# Patient Record
Sex: Female | Born: 1975 | Race: Black or African American | Hispanic: No | Marital: Single | Smoking: Never smoker
Health system: Southern US, Community
[De-identification: ages and names within clinical notes are randomized; demographics above are authoritative.]

## PROBLEM LIST (undated history)

## (undated) DIAGNOSIS — R51 Headache: Secondary | ICD-10-CM

## (undated) DIAGNOSIS — M797 Fibromyalgia: Secondary | ICD-10-CM

## (undated) DIAGNOSIS — Z8489 Family history of other specified conditions: Secondary | ICD-10-CM

## (undated) DIAGNOSIS — F32A Depression, unspecified: Secondary | ICD-10-CM

## (undated) DIAGNOSIS — G709 Myoneural disorder, unspecified: Secondary | ICD-10-CM

## (undated) DIAGNOSIS — R519 Headache, unspecified: Secondary | ICD-10-CM

## (undated) DIAGNOSIS — R87619 Unspecified abnormal cytological findings in specimens from cervix uteri: Secondary | ICD-10-CM

## (undated) DIAGNOSIS — I10 Essential (primary) hypertension: Secondary | ICD-10-CM

## (undated) DIAGNOSIS — IMO0002 Reserved for concepts with insufficient information to code with codable children: Secondary | ICD-10-CM

## (undated) DIAGNOSIS — D219 Benign neoplasm of connective and other soft tissue, unspecified: Secondary | ICD-10-CM

## (undated) DIAGNOSIS — F329 Major depressive disorder, single episode, unspecified: Secondary | ICD-10-CM

## (undated) DIAGNOSIS — G809 Cerebral palsy, unspecified: Secondary | ICD-10-CM

## (undated) HISTORY — DX: Unspecified abnormal cytological findings in specimens from cervix uteri: R87.619

## (undated) HISTORY — PX: MOUTH SURGERY: SHX715

## (undated) HISTORY — DX: Myoneural disorder, unspecified: G70.9

## (undated) HISTORY — DX: Benign neoplasm of connective and other soft tissue, unspecified: D21.9

## (undated) HISTORY — DX: Depression, unspecified: F32.A

## (undated) HISTORY — DX: Major depressive disorder, single episode, unspecified: F32.9

## (undated) HISTORY — DX: Reserved for concepts with insufficient information to code with codable children: IMO0002

---

## 1998-07-25 ENCOUNTER — Encounter: Admission: RE | Admit: 1998-07-25 | Discharge: 1998-07-25 | Payer: Self-pay | Admitting: Sports Medicine

## 1999-02-21 ENCOUNTER — Other Ambulatory Visit: Admission: RE | Admit: 1999-02-21 | Discharge: 1999-02-21 | Payer: Self-pay | Admitting: Family Medicine

## 1999-02-21 ENCOUNTER — Encounter: Admission: RE | Admit: 1999-02-21 | Discharge: 1999-02-21 | Payer: Self-pay | Admitting: Family Medicine

## 2000-02-27 ENCOUNTER — Encounter: Admission: RE | Admit: 2000-02-27 | Discharge: 2000-02-27 | Payer: Self-pay | Admitting: Family Medicine

## 2001-05-18 ENCOUNTER — Encounter: Admission: RE | Admit: 2001-05-18 | Discharge: 2001-05-18 | Payer: Self-pay | Admitting: Family Medicine

## 2002-03-15 ENCOUNTER — Encounter: Admission: RE | Admit: 2002-03-15 | Discharge: 2002-03-15 | Payer: Self-pay | Admitting: Family Medicine

## 2002-07-05 ENCOUNTER — Encounter: Admission: RE | Admit: 2002-07-05 | Discharge: 2002-07-05 | Payer: Self-pay | Admitting: Family Medicine

## 2002-11-22 ENCOUNTER — Encounter: Admission: RE | Admit: 2002-11-22 | Discharge: 2002-11-22 | Payer: Self-pay | Admitting: Family Medicine

## 2003-01-06 ENCOUNTER — Encounter: Admission: RE | Admit: 2003-01-06 | Discharge: 2003-01-06 | Payer: Self-pay | Admitting: Family Medicine

## 2003-05-18 ENCOUNTER — Encounter: Admission: RE | Admit: 2003-05-18 | Discharge: 2003-05-18 | Payer: Self-pay | Admitting: Family Medicine

## 2003-05-24 ENCOUNTER — Encounter: Admission: RE | Admit: 2003-05-24 | Discharge: 2003-05-24 | Payer: Self-pay | Admitting: Family Medicine

## 2003-06-15 ENCOUNTER — Encounter: Admission: RE | Admit: 2003-06-15 | Discharge: 2003-06-15 | Payer: Self-pay | Admitting: Family Medicine

## 2003-06-15 ENCOUNTER — Encounter: Payer: Self-pay | Admitting: Family Medicine

## 2003-07-13 ENCOUNTER — Encounter: Admission: RE | Admit: 2003-07-13 | Discharge: 2003-07-13 | Payer: Self-pay | Admitting: Family Medicine

## 2004-01-01 ENCOUNTER — Encounter: Admission: RE | Admit: 2004-01-01 | Discharge: 2004-01-01 | Payer: Self-pay | Admitting: Sports Medicine

## 2004-01-02 ENCOUNTER — Inpatient Hospital Stay (HOSPITAL_COMMUNITY): Admission: EM | Admit: 2004-01-02 | Discharge: 2004-01-03 | Payer: Self-pay | Admitting: Emergency Medicine

## 2004-01-16 ENCOUNTER — Encounter: Admission: RE | Admit: 2004-01-16 | Discharge: 2004-01-16 | Payer: Self-pay | Admitting: Family Medicine

## 2004-04-11 ENCOUNTER — Ambulatory Visit: Payer: Self-pay | Admitting: Family Medicine

## 2004-07-04 ENCOUNTER — Ambulatory Visit: Payer: Self-pay | Admitting: Family Medicine

## 2004-10-11 ENCOUNTER — Ambulatory Visit: Payer: Self-pay | Admitting: Family Medicine

## 2004-10-25 ENCOUNTER — Ambulatory Visit: Payer: Self-pay | Admitting: Family Medicine

## 2004-11-14 ENCOUNTER — Ambulatory Visit: Payer: Self-pay | Admitting: Family Medicine

## 2005-06-17 ENCOUNTER — Ambulatory Visit: Payer: Self-pay | Admitting: Family Medicine

## 2005-06-17 ENCOUNTER — Other Ambulatory Visit: Admission: RE | Admit: 2005-06-17 | Discharge: 2005-06-17 | Payer: Self-pay | Admitting: Family Medicine

## 2005-06-17 ENCOUNTER — Encounter: Payer: Self-pay | Admitting: Family Medicine

## 2005-07-02 ENCOUNTER — Encounter (INDEPENDENT_AMBULATORY_CARE_PROVIDER_SITE_OTHER): Payer: Self-pay | Admitting: *Deleted

## 2005-09-02 ENCOUNTER — Ambulatory Visit: Payer: Self-pay | Admitting: Family Medicine

## 2005-09-05 ENCOUNTER — Ambulatory Visit: Payer: Self-pay | Admitting: Family Medicine

## 2006-09-24 DIAGNOSIS — J45909 Unspecified asthma, uncomplicated: Secondary | ICD-10-CM

## 2006-09-24 DIAGNOSIS — G809 Cerebral palsy, unspecified: Secondary | ICD-10-CM | POA: Insufficient documentation

## 2006-09-24 DIAGNOSIS — K589 Irritable bowel syndrome without diarrhea: Secondary | ICD-10-CM | POA: Insufficient documentation

## 2006-09-25 ENCOUNTER — Encounter (INDEPENDENT_AMBULATORY_CARE_PROVIDER_SITE_OTHER): Payer: Self-pay | Admitting: *Deleted

## 2007-01-19 ENCOUNTER — Telehealth: Payer: Self-pay | Admitting: *Deleted

## 2007-01-21 ENCOUNTER — Encounter (INDEPENDENT_AMBULATORY_CARE_PROVIDER_SITE_OTHER): Payer: Self-pay | Admitting: Family Medicine

## 2007-01-21 ENCOUNTER — Ambulatory Visit: Payer: Self-pay | Admitting: Family Medicine

## 2007-01-21 ENCOUNTER — Encounter: Payer: Self-pay | Admitting: Family Medicine

## 2007-01-21 DIAGNOSIS — N949 Unspecified condition associated with female genital organs and menstrual cycle: Secondary | ICD-10-CM

## 2007-01-21 LAB — CONVERTED CEMR LAB
Beta hcg, urine, semiquantitative: NEGATIVE
Bilirubin Urine: NEGATIVE
Chlamydia, DNA Probe: NEGATIVE
GC Probe Amp, Genital: NEGATIVE
Glucose, Urine, Semiquant: NEGATIVE
Hemoglobin: 14.4 g/dL
Nitrite: NEGATIVE
Protein, U semiquant: NEGATIVE
RBC: 4.72 M/uL
Specific Gravity, Urine: 1.02
TSH: 2.365 microintl units/mL (ref 0.350–5.50)
Urobilinogen, UA: 0.2
WBC: 5.2 10*3/uL
Whiff Test: NEGATIVE

## 2007-02-15 ENCOUNTER — Encounter: Payer: Self-pay | Admitting: Family Medicine

## 2007-04-09 ENCOUNTER — Encounter (INDEPENDENT_AMBULATORY_CARE_PROVIDER_SITE_OTHER): Payer: Self-pay | Admitting: Family Medicine

## 2007-04-09 ENCOUNTER — Encounter (INDEPENDENT_AMBULATORY_CARE_PROVIDER_SITE_OTHER): Payer: Self-pay | Admitting: *Deleted

## 2007-04-09 ENCOUNTER — Ambulatory Visit: Payer: Self-pay | Admitting: Family Medicine

## 2007-04-09 ENCOUNTER — Telehealth (INDEPENDENT_AMBULATORY_CARE_PROVIDER_SITE_OTHER): Payer: Self-pay | Admitting: *Deleted

## 2007-04-19 ENCOUNTER — Telehealth: Payer: Self-pay | Admitting: Family Medicine

## 2007-06-04 ENCOUNTER — Encounter: Payer: Self-pay | Admitting: Family Medicine

## 2007-10-27 ENCOUNTER — Encounter: Payer: Self-pay | Admitting: Family Medicine

## 2007-12-09 ENCOUNTER — Encounter: Payer: Self-pay | Admitting: Family Medicine

## 2008-04-14 ENCOUNTER — Telehealth: Payer: Self-pay | Admitting: *Deleted

## 2008-04-14 ENCOUNTER — Ambulatory Visit: Payer: Self-pay | Admitting: Family Medicine

## 2008-04-14 LAB — CONVERTED CEMR LAB
Beta hcg, urine, semiquantitative: NEGATIVE
Ketones, urine, test strip: NEGATIVE
Nitrite: NEGATIVE
Urobilinogen, UA: 0.2
WBC Urine, dipstick: NEGATIVE

## 2008-04-20 ENCOUNTER — Ambulatory Visit: Payer: Self-pay | Admitting: Family Medicine

## 2008-05-05 ENCOUNTER — Telehealth: Payer: Self-pay | Admitting: Family Medicine

## 2008-05-10 ENCOUNTER — Telehealth: Payer: Self-pay | Admitting: *Deleted

## 2008-05-11 ENCOUNTER — Ambulatory Visit: Payer: Self-pay | Admitting: Family Medicine

## 2008-05-11 ENCOUNTER — Encounter: Payer: Self-pay | Admitting: Family Medicine

## 2008-05-11 ENCOUNTER — Other Ambulatory Visit: Admission: RE | Admit: 2008-05-11 | Discharge: 2008-05-11 | Payer: Self-pay | Admitting: Family Medicine

## 2008-05-15 ENCOUNTER — Telehealth: Payer: Self-pay | Admitting: Family Medicine

## 2008-07-10 ENCOUNTER — Telehealth: Payer: Self-pay | Admitting: *Deleted

## 2009-02-01 ENCOUNTER — Telehealth: Payer: Self-pay | Admitting: *Deleted

## 2009-05-21 ENCOUNTER — Telehealth: Payer: Self-pay | Admitting: *Deleted

## 2009-05-23 ENCOUNTER — Ambulatory Visit: Payer: Self-pay | Admitting: Family Medicine

## 2009-05-23 ENCOUNTER — Inpatient Hospital Stay (HOSPITAL_COMMUNITY): Admission: AD | Admit: 2009-05-23 | Discharge: 2009-05-24 | Payer: Self-pay | Admitting: Family Medicine

## 2009-05-23 ENCOUNTER — Telehealth: Payer: Self-pay | Admitting: Family Medicine

## 2009-06-18 ENCOUNTER — Ambulatory Visit: Payer: Self-pay | Admitting: Family Medicine

## 2009-06-18 DIAGNOSIS — I10 Essential (primary) hypertension: Secondary | ICD-10-CM

## 2009-06-18 LAB — CONVERTED CEMR LAB: Chlamydia, DNA Probe: NEGATIVE

## 2010-01-29 ENCOUNTER — Telehealth: Payer: Self-pay | Admitting: Family Medicine

## 2010-01-30 ENCOUNTER — Ambulatory Visit: Payer: Self-pay | Admitting: Family Medicine

## 2010-03-27 ENCOUNTER — Encounter: Payer: Self-pay | Admitting: Family Medicine

## 2010-04-25 ENCOUNTER — Telehealth: Payer: Self-pay | Admitting: *Deleted

## 2010-08-23 ENCOUNTER — Telehealth: Payer: Self-pay | Admitting: Family Medicine

## 2010-08-29 ENCOUNTER — Encounter: Payer: Self-pay | Admitting: *Deleted

## 2010-08-29 NOTE — Miscellaneous (Signed)
  Clinical Lists Changes  Problems: Changed problem from ASTHMA, UNSPECIFIED (ICD-493.90) to ASTHMA, PERSISTENT (ICD-493.90) 

## 2010-08-29 NOTE — Progress Notes (Signed)
Summary: Rx  Phone Note Refill Request Call back at Home Phone [phone]   Refills Requested: Medication #1:  FLOVENT HFA 110 MCG/ACT AERO 1 puff two times a day - 1 mdi. [facility]   Initial call taken by: Knox Royalty,  August 23, 2010 2:06 PM  Follow-up for Phone Call        will forward to MD. Follow-up by: Theresia Lo RN,  August 23, 2010 2:35 PM    Prescriptions: FLOVENT HFA 110 MCG/ACT AERO (FLUTICASONE PROPIONATE  HFA) 1 puff two times a day - 1 mdi  #1 x 6   Entered and Authorized by:   Pearlean Brownie MD   Signed by:   Pearlean Brownie MD on 08/23/2010   Method used:   Electronically to        [id number] (retail)       [address]       [place], [place]  [address]       Ph: [phone] or [phone]       Fax: [phone]   RxID:   [id number]

## 2010-08-29 NOTE — Progress Notes (Signed)
Summary: Rx Req  Phone Note Refill Request Call back at Home Phone [phone] Message from:  Patient  Refills Requested: Medication #1:  PROAIR HFA 108 (90 BASE) MCG/ACT  AERS 2 puffs as needed up to 4x a day [facility] [address]  Initial call taken by: Clydell Hakim,  January 29, 2010 1:41 PM    Prescriptions: PROAIR HFA 108 (90 BASE) MCG/ACT  AERS (ALBUTEROL SULFATE) 2 puffs as needed up to 4x a day  #1 x 1   Entered and Authorized by:   Pearlean Brownie MD   Signed by:   Pearlean Brownie MD on 01/29/2010   Method used:   Electronically to        [id number] (retail)       [address]       [place], [place]  [address]       Ph: [phone] or [phone]       Fax: [phone]   RxID:   [id number]

## 2010-08-29 NOTE — Assessment & Plan Note (Signed)
Summary: asthma & congestion/[place]   Vital Signs:  Patient profile:   35 year old female Height:      57.5 inches Weight:      170.8 pounds BMI:     36.45 O2 Sat:      100 % on Room air Temp:     99.5 degrees F oral Pulse rate:   87 / minute BP sitting:   140 / 99  (left arm) Cuff size:   regular  Vitals Entered By: Garen Grams LPN (January 31, 5408 9:26 AM)  O2 Flow:  Room air  Serial Vital Signs/Assessments:  Comments: 9:27 AM Peak Flow Rates: 280 280 270 By: Garen Grams LPN   CC: cough/congestion/SOB x 3 days Is Patient Diabetic? No Pain Assessment Patient in pain? no        Primary Care Provider:  Pearlean Brownie MD  CC:  cough/congestion/SOB x 3 days.  History of Present Illness: URI and wheezing Has had stuffy nose with increased wheezing for last few days. Had sick contact at work.  Ran out of proair and has not used for a week.  Has not use flovent for a "long time".  Feels tired but does not have severe shortness of breath and has been going to work.  No fever or sputum or chest pain.  BP has taken occaisionally has usually been "good" but does not recall readings  ROS - as above PMH - Medications reviewed and updated in medication list.  Smoking Status noted in VS form     Habits & Providers  Alcohol-Tobacco-Diet     Tobacco Status: never  Allergies: 1)  ! * Cats, Dust, Pollen  Physical Exam  General:  no acute distress or audible wheezing Nose:  congested bilaterally   Lungs:  mild expiratory wheeze.  No dullness good airmovement Heart:  Normal rate and regular rhythm. S1 and S2 normal without gallop, murmur, click, rub or other extra sounds. Extremities:  no edema   Impression & Recommendations:  Problem # 1:  ASTHMA, UNSPECIFIED (ICD-493.90)  mild exacerbation.  Will restart proair and use short increased dose of inhaled steroid.  Do not think needs by mouth steroids now.  No signs of bacgterial infection  Her updated medication  list for this problem includes:    Proair Hfa 108 (90 Base) Mcg/act Aers (Albuterol sulfate) .Marland Kitchen... 2 puffs as needed up to 4x a day    Flovent Hfa 110 Mcg/act Aero (Fluticasone propionate  hfa) .Marland Kitchen... 1 puff two times a day - 1 mdi  Orders: [facility]- Est Level  3 (99213)  Problem # 2:  ELEVATED BLOOD PRESSURE WITHOUT DIAGNOSIS OF HYPERTENSION (ICD-796.2) keep a diary and will recheck   Complete Medication List: 1)  Sprintec 28 0.25-35 Mg-mcg Tabs (Norgestimate-eth estradiol) .... As directed 2)  Ferrous Sulfate 325 (65 Fe) Mg Tbec (Ferrous sulfate) .... Two times a day 3)  Proair Hfa 108 (90 Base) Mcg/act Aers (Albuterol sulfate) .... 2 puffs as needed up to 4x a day 4)  Flovent Hfa 110 Mcg/act Aero (Fluticasone propionate  hfa) .Marland Kitchen.. 1 puff two times a day - 1 mdi  Patient Instructions: 1)  Please schedule a follow-up appointment in 1 month.  2)  Check your blood pressure twice a week and write down and bring in. 3)  Use the flovent 4 puffs two times a day for the next 7 days then go back to 2 puffs two times a day 4)  Use the proair 4 x  a day or as needed if having to use more than every 2-4 hours call us Prescriptions: FLOVENT HFA 110 MCG/ACT AERO (FLUTICASONE PROPIONATE  HFA) 1 puff two times a day - 1 mdi  #1 x 6   Entered and Authorized by:   Pearlean Brownie MD   Signed by:   Pearlean Brownie MD on 01/30/2010   Method used:   Electronically to        [id number]       [address]       [place], [place]  [address]       Ph: [phone] or [phone]       Fax: [phone]   RxID:   [id number]

## 2010-08-29 NOTE — Progress Notes (Signed)
Summary: triage  Phone Note Call from Patient Call back at Home Phone [phone]   Caller: Patient Summary of Call: achey/congestion/sinus prob/no fever  Initial call taken by: De Nurse,  April 25, 2010 1:36 PM  Follow-up for Phone Call        Described cold signs.  Told her the best treatment was OTCs and fluids.  Had been taking Nyquil which she says was making her dizzy.  Advised her to switch to Dayquil.  If she is not feeling any better in am to call us and we would see her.  Pt agreeable. Follow-up by: Dennison Nancy RN,  April 25, 2010 2:06 PM

## 2010-08-29 NOTE — Progress Notes (Signed)
Summary: triage  Phone Note Call from Patient Call back at Home Phone [phone]   Caller: Patient Summary of Call: Pt wondering if she can be seen tomorrow morning for her asthma. Initial call taken by: Clydell Hakim,  January 29, 2010 2:56 PM  Follow-up for Phone Call        states she is congested & her asthma is acting up. states she wants to avoid hospitalization. severity is a 3/10 per pt. told her if it got worse, we have md on call. states she has her meds. sounded well on phone Follow-up by: Golden Circle RN,  January 29, 2010 2:56 PM

## 2010-11-05 LAB — RUBELLA ANTIBODY, IGM: Rubella: IMMUNE

## 2010-11-05 LAB — HEPATITIS B SURFACE ANTIGEN: Hepatitis B Surface Ag: NEGATIVE

## 2010-11-05 LAB — ABO/RH: RH Type: POSITIVE

## 2010-11-11 ENCOUNTER — Inpatient Hospital Stay (HOSPITAL_COMMUNITY)
Admission: AD | Admit: 2010-11-11 | Discharge: 2010-11-11 | Disposition: A | Discharge status: Home / Self Care | Payer: Medicare Other | Source: Ambulatory Visit | Attending: Obstetrics and Gynecology | Admitting: Obstetrics and Gynecology

## 2010-11-11 ENCOUNTER — Inpatient Hospital Stay (HOSPITAL_COMMUNITY): Payer: Medicare Other

## 2010-11-11 DIAGNOSIS — O3410 Maternal care for benign tumor of corpus uteri, unspecified trimester: Secondary | ICD-10-CM

## 2010-11-11 DIAGNOSIS — D259 Leiomyoma of uterus, unspecified: Secondary | ICD-10-CM | POA: Insufficient documentation

## 2010-11-11 DIAGNOSIS — R109 Unspecified abdominal pain: Secondary | ICD-10-CM | POA: Insufficient documentation

## 2010-11-11 LAB — URINALYSIS, ROUTINE W REFLEX MICROSCOPIC
Glucose, UA: NEGATIVE mg/dL
Protein, ur: NEGATIVE mg/dL
pH: 6.5 (ref 5.0–8.0)

## 2010-11-11 LAB — URINE MICROSCOPIC-ADD ON

## 2010-11-13 ENCOUNTER — Inpatient Hospital Stay (HOSPITAL_COMMUNITY)
Admission: AD | Admit: 2010-11-13 | Discharge: 2010-11-14 | Disposition: A | Discharge status: Home / Self Care | Payer: Medicare Other | Source: Ambulatory Visit | Attending: Obstetrics and Gynecology | Admitting: Obstetrics and Gynecology

## 2010-11-13 DIAGNOSIS — R109 Unspecified abdominal pain: Secondary | ICD-10-CM

## 2010-11-13 DIAGNOSIS — O9989 Other specified diseases and conditions complicating pregnancy, childbirth and the puerperium: Secondary | ICD-10-CM

## 2010-11-13 DIAGNOSIS — O99891 Other specified diseases and conditions complicating pregnancy: Secondary | ICD-10-CM | POA: Insufficient documentation

## 2010-11-13 LAB — URINE CULTURE: Culture  Setup Time: 201204170203

## 2010-11-14 LAB — URINALYSIS, ROUTINE W REFLEX MICROSCOPIC
Glucose, UA: NEGATIVE mg/dL
Nitrite: NEGATIVE
pH: 6 (ref 5.0–8.0)

## 2010-11-14 LAB — CBC
HCT: 37.4 % (ref 36.0–46.0)
Hemoglobin: 12.4 g/dL (ref 12.0–15.0)
MCH: 29.5 pg (ref 26.0–34.0)
MCHC: 33.2 g/dL (ref 30.0–36.0)
RBC: 4.21 MIL/uL (ref 3.87–5.11)
WBC: 11.3 10*3/uL — ABNORMAL HIGH (ref 4.0–10.5)

## 2010-11-14 LAB — URINE MICROSCOPIC-ADD ON

## 2010-12-13 NOTE — Discharge Summary (Signed)
NAME:  [name] [name], [name] [name]                          ACCOUNT NO.:  [id number]   MEDICAL RECORD NO.:  [id number]                   PATIENT TYPE:  INP   LOCATION:  5735                                 FACILITY:  [facility]   PHYSICIAN:  Santiago Bumpers. Hensel, M.D.             DATE OF BIRTH:  03-07-1976   DATE OF ADMISSION:  01/01/2004  DATE OF DISCHARGE:  01/03/2004                                 DISCHARGE SUMMARY   DISCHARGE DIAGNOSES:  1. Acute asthma exacerbation.  2. Irritable bowel syndrome.  3. Asthma, moderate persistent.   DISCHARGE MEDICATIONS:  1. Qvar 80 mcg per spray inhaled b.i.d.  2. Albuterol M.D.I. with spacer, 2-4 sprays q.4h. p.r.n. wheezing and     shortness of breath.  3. Multivitamin once a day.  4. Os-Cal vitamin D as taken before.  5. Prednisone 100 mg one a day x7 days.  6. Colace or other stool softener as taken before.  7. Entex LA one capsule p.r.n. q.12h. cough.  8. May take Robitussin DM over the counter.  9. Ranitidine as taken before.  10.      Ibuprofen p.r.n.  11.      Tylenol p.r.n.   ACTIVITY:  Without restrictions.   DIET:  Increase fiber.   FOLLOW UP:  Follow up with Dr. Jolinda Croak at [facility] on  January 16, 2004 at 11 a.m. as already scheduled.   BRIEF HISTORY AND HOSPITAL COURSE:  Ms. Bonnie is a 35 year old African-  American female with moderate persistent asthma with increasing symptoms x3  days secondary to upper respiratory infection.  She presented to the ED with  increasing shortness of breath, and on exam had increased work of breathing,  and was admitted.  1. Acute asthma exacerbation.  The patient was noncompliant with maintenance     inhaled steroid for several months.  Now likely persistent symptoms with     viral URI.  Chest x-ray was within normal limits.  Responded to nebs in     the ED, although still with increased work of breathing.  Improved with     nebulizer treatments and corticosteroids, and was sent home on  the above     regimen on hospital day #2 with normal O2 saturations.  2. Irritable bowel syndrome.  Continued on outpatient regimen while in the     hospital, including fiber, Colace, Dulcolax, multivitamin, calcium, and     vitamin D.  3. Obesity.  The patient working on increasing her exercise, which has been     difficult with her asthma.  Encouraged maintenance Qvar, as well as     albuterol 15-30 minutes prior to exercise as needed.  4. The patient was administered the pneumococcal vaccine per protocol on     January 02, 2004.      Ace Gins, MD  William A. Leveda Anna, M.D.    JS/MEDQ  D:  02/19/2004  T:  02/20/2004  Job:  [id number]   cc:   Dr. Annie Main(?)  [facility]

## 2010-12-20 ENCOUNTER — Ambulatory Visit (INDEPENDENT_AMBULATORY_CARE_PROVIDER_SITE_OTHER): Payer: Medicaid Other | Admitting: Family Medicine

## 2010-12-20 ENCOUNTER — Encounter: Payer: Self-pay | Admitting: Family Medicine

## 2010-12-20 ENCOUNTER — Telehealth: Payer: Self-pay | Admitting: Family Medicine

## 2010-12-20 VITALS — BP 122/88 | HR 108 | Temp 98.3°F | Ht <= 58 in | Wt 169.6 lb

## 2010-12-20 DIAGNOSIS — H6690 Otitis media, unspecified, unspecified ear: Secondary | ICD-10-CM

## 2010-12-20 DIAGNOSIS — J45909 Unspecified asthma, uncomplicated: Secondary | ICD-10-CM

## 2010-12-20 MED ORDER — ALBUTEROL SULFATE HFA 108 (90 BASE) MCG/ACT IN AERS: 2.0000 | INHALATION_SPRAY | RESPIRATORY_TRACT | Status: DC | PRN

## 2010-12-20 MED ORDER — FLUTICASONE PROPIONATE HFA 110 MCG/ACT IN AERO: 2.0000 | INHALATION_SPRAY | Freq: Two times a day (BID) | RESPIRATORY_TRACT | Status: DC

## 2010-12-20 MED ORDER — AMOXICILLIN 875 MG PO TABS: 875.0000 mg | ORAL_TABLET | Freq: Two times a day (BID) | ORAL | Status: AC

## 2010-12-20 MED ORDER — PRENATAL RX 60-1 MG PO TABS: 1.0000 | ORAL_TABLET | Freq: Every day | ORAL | Status: DC

## 2010-12-20 NOTE — Telephone Encounter (Signed)
Will send in antibiotics for her ears. They are ok to take while pregnant. Please let her know. Thanks.

## 2010-12-20 NOTE — Telephone Encounter (Signed)
Reviewed OV note and Dr. Clotilde Dieter did instruct her to call back if ears still bothering her.  Will route this note to her.

## 2010-12-20 NOTE — Telephone Encounter (Signed)
Pt says she was told to call if her ears were still bothering her, she says they are & was told MD would call in abx if this was still an issue.

## 2010-12-20 NOTE — Patient Instructions (Signed)
After discussing the options with Dr. Deirdre Priest, it is felt that you should restart the Flovent and Albuterol. Do the saline nasal spray to help with congestion.  If you ear begins to cause you pain or your hearing changes let us know and i will call in antibiotics.

## 2010-12-20 NOTE — Telephone Encounter (Signed)
Pt called back and informed of above.

## 2010-12-26 NOTE — Progress Notes (Signed)
Asthma vs URI: Pt is [redacted] weeks pregnant and comes in today because she has started feeling like she is getting a cold. She had a sore throat over teh weekend with some congestion. She tried taking Mucinex but it did not help. She has been wheezing at night but has not had fevers or chills. She is continuing to work at [employer]  and says that some of the patients have been sick so she has had exposure. She is currently out of albuterol so she has not been using anything for her asthma. Pt also developed some ear pain.   ROs: neg except as noted in HPI.   PE:  Gen: NAD, seated comfrotably. VS show tachycardia and O2 of 98% on room air.  Ears;Rt ear has a bulging TM but no erythema in the canal or on the TM.  Left ear appears normal.  CV: RRR, no murmur, no tachycardia by the time I listened to the patient she had been seated for a while.  Pulm: diffuse wheezing throughout entire lung fields.

## 2010-12-26 NOTE — Assessment & Plan Note (Signed)
Discussed with Dr. Deirdre Priest.  Decided to put the patient on an antibiotic because of ear pain, refilled her asthma meds.

## 2011-01-28 ENCOUNTER — Inpatient Hospital Stay (HOSPITAL_COMMUNITY)
Admission: AD | Admit: 2011-01-28 | Discharge: 2011-01-28 | Disposition: A | Discharge status: Home / Self Care | Payer: Medicare Other | Source: Ambulatory Visit | Attending: Obstetrics and Gynecology | Admitting: Obstetrics and Gynecology

## 2011-01-28 DIAGNOSIS — O9989 Other specified diseases and conditions complicating pregnancy, childbirth and the puerperium: Secondary | ICD-10-CM

## 2011-01-28 DIAGNOSIS — O99891 Other specified diseases and conditions complicating pregnancy: Secondary | ICD-10-CM | POA: Insufficient documentation

## 2011-03-17 ENCOUNTER — Telehealth (HOSPITAL_COMMUNITY): Payer: Self-pay | Admitting: *Deleted

## 2011-03-17 ENCOUNTER — Encounter (HOSPITAL_COMMUNITY): Payer: Self-pay | Admitting: *Deleted

## 2011-03-17 NOTE — Telephone Encounter (Signed)
Preadmission screen  

## 2011-03-17 NOTE — Telephone Encounter (Signed)
pread screen 

## 2011-03-18 ENCOUNTER — Telehealth (HOSPITAL_COMMUNITY): Payer: Self-pay | Admitting: *Deleted

## 2011-03-19 ENCOUNTER — Inpatient Hospital Stay (HOSPITAL_COMMUNITY): Admission: RE | Admit: 2011-03-19 | Payer: Medicaid Other | Source: Ambulatory Visit

## 2011-03-19 ENCOUNTER — Encounter (HOSPITAL_COMMUNITY): Payer: Self-pay

## 2011-03-19 ENCOUNTER — Inpatient Hospital Stay (HOSPITAL_COMMUNITY)
Admission: AD | Admit: 2011-03-19 | Discharge: 2011-03-23 | DRG: 765 | Disposition: A | Discharge status: Home / Self Care | Payer: Medicare Other | Source: Ambulatory Visit | Attending: Obstetrics and Gynecology | Admitting: Obstetrics and Gynecology

## 2011-03-19 DIAGNOSIS — O99892 Other specified diseases and conditions complicating childbirth: Secondary | ICD-10-CM | POA: Diagnosis present

## 2011-03-19 DIAGNOSIS — O4290 Premature rupture of membranes, unspecified as to length of time between rupture and onset of labor, unspecified weeks of gestation: Secondary | ICD-10-CM | POA: Diagnosis present

## 2011-03-19 DIAGNOSIS — Z98891 History of uterine scar from previous surgery: Secondary | ICD-10-CM

## 2011-03-19 DIAGNOSIS — G809 Cerebral palsy, unspecified: Secondary | ICD-10-CM | POA: Diagnosis present

## 2011-03-19 DIAGNOSIS — J45909 Unspecified asthma, uncomplicated: Secondary | ICD-10-CM

## 2011-03-19 DIAGNOSIS — D259 Leiomyoma of uterus, unspecified: Secondary | ICD-10-CM | POA: Diagnosis present

## 2011-03-19 DIAGNOSIS — O34599 Maternal care for other abnormalities of gravid uterus, unspecified trimester: Secondary | ICD-10-CM | POA: Diagnosis present

## 2011-03-19 DIAGNOSIS — K560 Paralytic ileus: Secondary | ICD-10-CM | POA: Diagnosis not present

## 2011-03-19 DIAGNOSIS — D4959 Neoplasm of unspecified behavior of other genitourinary organ: Secondary | ICD-10-CM | POA: Diagnosis present

## 2011-03-19 LAB — CBC
HCT: 34.7 % — ABNORMAL LOW (ref 36.0–46.0)
RDW: 14.7 % (ref 11.5–15.5)
WBC: 10.4 10*3/uL (ref 4.0–10.5)

## 2011-03-19 MED ORDER — ONDANSETRON HCL 4 MG/2ML IJ SOLN
4.0000 mg | Freq: Four times a day (QID) | INTRAMUSCULAR | Status: DC | PRN
  Administered 2011-03-20: 4 mg via INTRAVENOUS
  Filled 2011-03-19: qty 2

## 2011-03-19 MED ORDER — FENTANYL 2.5 MCG/ML BUPIVACAINE 1/10 % EPIDURAL INFUSION (WH - ANES)
14.0000 mL/h | INTRAMUSCULAR | Status: DC
  Administered 2011-03-19 – 2011-03-20 (×5): 14 mL/h via EPIDURAL
  Filled 2011-03-19 (×6): qty 60

## 2011-03-19 MED ORDER — EPHEDRINE 5 MG/ML INJ
10.0000 mg | INTRAVENOUS | Status: DC | PRN
  Filled 2011-03-19: qty 4

## 2011-03-19 MED ORDER — PHENYLEPHRINE 40 MCG/ML (10ML) SYRINGE FOR IV PUSH (FOR BLOOD PRESSURE SUPPORT)
80.0000 ug | PREFILLED_SYRINGE | INTRAVENOUS | Status: DC | PRN
  Filled 2011-03-19: qty 5

## 2011-03-19 MED ORDER — ACETAMINOPHEN 325 MG PO TABS: 650.0000 mg | ORAL_TABLET | ORAL | Status: DC | PRN

## 2011-03-19 MED ORDER — BUTORPHANOL TARTRATE 2 MG/ML IJ SOLN
INTRAMUSCULAR | Status: AC
  Administered 2011-03-19: 1 mg via INTRAVENOUS
  Filled 2011-03-19: qty 1

## 2011-03-19 MED ORDER — IBUPROFEN 600 MG PO TABS: 600.0000 mg | ORAL_TABLET | Freq: Four times a day (QID) | ORAL | Status: DC | PRN

## 2011-03-19 MED ORDER — FLEET ENEMA 7-19 GM/118ML RE ENEM: 1.0000 | ENEMA | RECTAL | Status: DC | PRN

## 2011-03-19 MED ORDER — OXYTOCIN 20 UNITS IN LACTATED RINGERS INFUSION - SIMPLE: 125.0000 mL/h | INTRAVENOUS | Status: AC

## 2011-03-19 MED ORDER — LACTATED RINGERS IV SOLN: 500.0000 mL | INTRAVENOUS | Status: DC | PRN

## 2011-03-19 MED ORDER — LIDOCAINE HCL (PF) 1 % IJ SOLN
30.0000 mL | INTRAMUSCULAR | Status: DC | PRN
  Filled 2011-03-19: qty 30

## 2011-03-19 MED ORDER — EPHEDRINE 5 MG/ML INJ
10.0000 mg | INTRAVENOUS | Status: DC | PRN
  Filled 2011-03-19 (×2): qty 4

## 2011-03-19 MED ORDER — BUTORPHANOL TARTRATE 2 MG/ML IJ SOLN
1.0000 mg | Freq: Once | INTRAMUSCULAR | Status: AC
  Administered 2011-03-19: 1 mg via INTRAVENOUS

## 2011-03-19 MED ORDER — PENICILLIN G POTASSIUM 5000000 UNITS IJ SOLR
2.5000 10*6.[IU] | INTRAVENOUS | Status: DC
  Filled 2011-03-19 (×3): qty 2.5

## 2011-03-19 MED ORDER — OXYCODONE-ACETAMINOPHEN 5-325 MG PO TABS: 2.0000 | ORAL_TABLET | ORAL | Status: DC | PRN

## 2011-03-19 MED ORDER — OXYTOCIN 20 UNITS IN LACTATED RINGERS INFUSION - SIMPLE
1.0000 m[IU]/min | INTRAVENOUS | Status: DC
  Administered 2011-03-19: 1 m[IU]/min via INTRAVENOUS
  Administered 2011-03-20 (×2): 14 m[IU]/min via INTRAVENOUS
  Filled 2011-03-19: qty 1000

## 2011-03-19 MED ORDER — LACTATED RINGERS IV SOLN
INTRAVENOUS | Status: DC
  Administered 2011-03-19: 125 mL/h via INTRAVENOUS
  Administered 2011-03-19 – 2011-03-20 (×9): via INTRAVENOUS

## 2011-03-19 MED ORDER — LACTATED RINGERS IV SOLN
500.0000 mL | Freq: Once | INTRAVENOUS | Status: AC
  Administered 2011-03-19: 500 mL via INTRAVENOUS

## 2011-03-19 MED ORDER — PENICILLIN G POTASSIUM 5000000 UNITS IJ SOLR
5.0000 10*6.[IU] | Freq: Once | INTRAVENOUS | Status: DC
  Filled 2011-03-19: qty 5

## 2011-03-19 MED ORDER — DIPHENHYDRAMINE HCL 50 MG/ML IJ SOLN: 12.5000 mg | INTRAMUSCULAR | Status: DC | PRN

## 2011-03-19 MED ORDER — OXYTOCIN BOLUS FROM INFUSION
500.0000 mL | Freq: Once | INTRAVENOUS | Status: DC
  Filled 2011-03-19: qty 500

## 2011-03-19 MED ORDER — CLINDAMYCIN PHOSPHATE 900 MG/50ML IV SOLN: 900.0000 mg | Freq: Three times a day (TID) | INTRAVENOUS | Status: DC

## 2011-03-19 MED ORDER — TERBUTALINE SULFATE 1 MG/ML IJ SOLN: 0.2500 mg | Freq: Once | INTRAMUSCULAR | Status: AC | PRN

## 2011-03-19 MED ORDER — LACTATED RINGERS IV SOLN: INTRAVENOUS | Status: DC

## 2011-03-19 MED ORDER — PHENYLEPHRINE 40 MCG/ML (10ML) SYRINGE FOR IV PUSH (FOR BLOOD PRESSURE SUPPORT)
80.0000 ug | PREFILLED_SYRINGE | INTRAVENOUS | Status: DC | PRN
  Filled 2011-03-19 (×2): qty 5

## 2011-03-19 MED ORDER — FENTANYL 2.5 MCG/ML BUPIVACAINE 1/10 % EPIDURAL INFUSION (WH - ANES)
INTRAMUSCULAR | Status: DC | PRN
  Administered 2011-03-19: 14 mL/h via EPIDURAL

## 2011-03-19 MED ORDER — LIDOCAINE HCL 1.5 % IJ SOLN
INTRAMUSCULAR | Status: DC | PRN
  Administered 2011-03-19 (×2): 5 mL via EPIDURAL

## 2011-03-19 MED ORDER — CITRIC ACID-SODIUM CITRATE 334-500 MG/5ML PO SOLN
30.0000 mL | ORAL | Status: DC | PRN
  Administered 2011-03-20: 30 mL via ORAL
  Filled 2011-03-19: qty 15

## 2011-03-19 MED ORDER — OXYTOCIN 20 UNITS IN LACTATED RINGERS INFUSION - SIMPLE: 125.0000 mL/h | INTRAVENOUS | Status: DC

## 2011-03-19 MED ORDER — LIDOCAINE HCL (PF) 1 % IJ SOLN: 30.0000 mL | INTRAMUSCULAR | Status: DC | PRN

## 2011-03-19 MED ORDER — OXYTOCIN BOLUS FROM INFUSION
500.0000 mL | Freq: Once | INTRAVENOUS | Status: DC
  Filled 2011-03-19: qty 1000
  Filled 2011-03-19: qty 500

## 2011-03-19 NOTE — Progress Notes (Signed)
  Pitocin at 10 mu/minute. Contractions q 2-4 minutes. Cervix FT dilated 50 % effaced and vertex at -3 station.FHT's normal

## 2011-03-19 NOTE — Progress Notes (Signed)
Dr. Ambrose Mantle notified of pt status, SVE, FHR, UC pattern, pitocin amount, and pt's pain request for medication.  Orders received, will continue to monitor.

## 2011-03-19 NOTE — H&P (Signed)
NAME:  [name] [name], [name] [name]              ACCOUNT NO.:  [id number]  MEDICAL RECORD NO.:  [id number]  LOCATION:  9171                          FACILITY:  [facility]  PHYSICIAN:  Malachi Pro. Ambrose Mantle, M.D. DATE OF BIRTH:  May 20, 1976  DATE OF ADMISSION:  03/19/2011 DATE OF DISCHARGE:                             HISTORY & PHYSICAL   PRESENT ILLNESS:  This is a 35 year old black female para 0, gravida 1, EDC March 17, 2011, by an ultrasound at 15 weeks 3 days done on September 26, 2010.  The patient awoke this morning at 5:00 a.m. with ruptured membranes.  She did not arrive at the hospital until 12 noon and she is admitted and will start Pitocin.  She is O+ with a negative antibody. Pap smear is normal.  Rubella immune, RPR nonreactive.  Urine culture negative, hepatitis B surface antigen negative, HIV negative.  HSV culture negative, hemoglobin electrophoresis AA, GC and Chlamydia negative.  Quad screen was negative.  One-hour Glucola 99.  Group B strep negative.  This patient was seen initially at about 22 weeks' gestation.  Her pregnancy was complicated by significant pain probably secondary to a degenerating 11-cm fibroid in the left side of her uterus.  She had no other major problems during the pregnancy.  She awoke this morning at 5:00 a.m. with ruptured membranes with clear fluid.  She presented to the hospital at 12 noon.  PAST MEDICAL HISTORY:  No known allergies.  ILLNESSES:  She had an abnormal Pap smear in 2008, but the repeat was normal.  She has had a history of moderate asthma, uses inhaler about once a night, she was never intubated.  She has been hospitalized overnight about 10 times since age 89 for asthma.  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  She has had depression, never on medications.  She has had therapies in the past.  She has had a history of irritable bowel syndrome.  She did have Trichomonas  vaginitis.  OPERATIONS:  Oral surgery in 1995.  SOCIAL HISTORY:  She denies alcohol, tobacco and illicit substance abuse.  She does have a 4-year degree and business administration.  She is single.  FAMILY HISTORY:  Mother has diabetes, lung cancer and lupus, as well as Ricketts.  A brother has schizophrenia.  Maternal grandfather with a malignancy of the larynx.  Maternal grandmother with colon cancer.  There has been no obstetric history.  PHYSICAL EXAMINATION:  VITAL SIGNS:  On admission, blood pressure is 122/82, pulse is 101, temperature 98.2, respirations 16.  The patient is less than 5 feet tall. LUNGS:  Clear to auscultation. HEART:  Normal size and sounds.  No murmurs.  There is no wheezing in the lungs. ABDOMEN:  Soft.  She actually was examined in our office on August 21.  Fundal height was 41 cm.  Fetal heart tones are normal.  The membranes are ruptured with clear fluid.  The cervix is a fingertip dilated.  It is extremely long.  Presenting part is vertex at -4 station.  ADMITTING IMPRESSION:  Intrauterine pregnancy at 40+ weeks gestation, premature rupture of  membranes, cerebral palsy, large leiomyomata uteri. The patient is admitted to began Pitocin.  She is counseled that she has a number of factors which do not work in her favor as far as a vaginal delivery. 1. Premature rupture of membranes that has waited 6 hours for any     treatment. 2. Very short stature 3. Unfavorable cervix with a high presenting part. 4. Leiomyomata uteri. 5. Cerebral palsy.  She is started on Pitocin and will watch for     effacement and dilatation of the cervix.     Malachi Pro. Ambrose Mantle, M.D.     TFH/MEDQ  D:  03/19/2011  T:  03/19/2011  Job:  [id number]

## 2011-03-19 NOTE — Progress Notes (Signed)
Dr. Ambrose Mantle notified of pt status, FHR, UC pattern, pitocin amount, and pt's pain. Will continue to monitor.

## 2011-03-19 NOTE — Progress Notes (Signed)
Pt states she started leaking clear fluid at 0500. Continues to leak a large amount of clear fluid, clothing and pad saturated and leaking. Reports good fetal movement. Scheduled for IOL tonight. Reports no contractions.

## 2011-03-19 NOTE — Anesthesia Preprocedure Evaluation (Signed)
Anesthesia Evaluation  Name, MR# and DOB Patient awake  General Assessment Comment  Reviewed: Allergy & Precautions, H&P , NPO status , Patient's Chart, lab work & pertinent test results  Airway Mallampati: III TM Distance: >3 FB Neck ROM: full    Dental  (+) Partial Lower, Loose, Lower Dentures and Poor Dentition   Pulmonary  clear to auscultation  pulmonary exam normalPulmonary Exam Normal breath sounds clear to auscultation none    Cardiovascular     Neuro/Psych   GI/Hepatic/Renal negative GI ROS  negative Liver ROS  negative Renal ROS        Endo/Other  Negative Endocrine ROS (+)      Abdominal Normal abdominal exam  (+)   Musculoskeletal negative musculoskeletal ROS (+)   Hematology negative hematology ROS (+)   Peds  Reproductive/Obstetrics (+) Pregnancy    Anesthesia Other Findings             Anesthesia Physical Anesthesia Plan  ASA: II  Anesthesia Plan: Epidural   Post-op Pain Management:    Induction:   Airway Management Planned:   Additional Equipment:   Intra-op Plan:   Post-operative Plan:   Informed Consent: I have reviewed the patients History and Physical, chart, labs and discussed the procedure including the risks, benefits and alternatives for the proposed anesthesia with the patient or authorized representative who has indicated his/her understanding and acceptance.     Plan Discussed with:   Anesthesia Plan Comments:         Anesthesia Quick Evaluation

## 2011-03-19 NOTE — Progress Notes (Signed)
Dr. Ambrose Mantle at bedside and notified of pt status, FHR, UC pattern, pt's history of asthma, and diminished breath sounds on assessment.  Dr. Ambrose Mantle assessed pt and stated that her lungs were clear, will continue to monitor.

## 2011-03-19 NOTE — Anesthesia Procedure Notes (Signed)
Epidural Patient location during procedure: OB Start time: 03/19/2011 7:52 PM End time: 03/19/2011 8:03 PM Reason for block: procedure for pain  Staffing Anesthesiologist: Sandrea Hughs Performed by: anesthesiologist   Preanesthetic Checklist Completed: patient identified, site marked, surgical consent, pre-op evaluation, timeout performed, IV checked, risks and benefits discussed and monitors and equipment checked  Epidural Patient position: sitting Prep: site prepped and draped and DuraPrep Patient monitoring: continuous pulse ox and blood pressure Approach: midline Injection technique: LOR air  Needle:  Needle type: Tuohy  Needle gauge: 17 G Needle length: 9 cm Needle insertion depth: 9 cm Catheter type: closed end flexible Catheter size: 19 Gauge Catheter at skin depth: 14 cm Test dose: negative  Assessment Sensory level: T8 Events: blood not aspirated, injection not painful, no injection resistance, negative IV test and no paresthesia

## 2011-03-20 ENCOUNTER — Inpatient Hospital Stay (HOSPITAL_COMMUNITY): Payer: Medicare Other | Admitting: Anesthesiology

## 2011-03-20 ENCOUNTER — Encounter (HOSPITAL_COMMUNITY): Payer: Self-pay

## 2011-03-20 ENCOUNTER — Other Ambulatory Visit: Payer: Self-pay | Admitting: Obstetrics and Gynecology

## 2011-03-20 ENCOUNTER — Encounter (HOSPITAL_COMMUNITY): Payer: Self-pay | Admitting: Anesthesiology

## 2011-03-20 ENCOUNTER — Encounter (HOSPITAL_COMMUNITY)
Admission: AD | Disposition: A | Discharge status: Home / Self Care | Payer: Self-pay | Source: Ambulatory Visit | Attending: Obstetrics and Gynecology

## 2011-03-20 HISTORY — PX: MYOMECTOMY: SHX85

## 2011-03-20 LAB — ABO/RH: ABO/RH(D): O POS

## 2011-03-20 SURGERY — Surgical Case
Anesthesia: Regional | Site: Uterus | Wound class: Clean Contaminated

## 2011-03-20 MED ORDER — SODIUM BICARBONATE 8.4 % IV SOLN
INTRAVENOUS | Status: AC
  Filled 2011-03-20: qty 50

## 2011-03-20 MED ORDER — SODIUM CHLORIDE 0.9 % IJ SOLN: 3.0000 mL | INTRAMUSCULAR | Status: DC | PRN

## 2011-03-20 MED ORDER — IBUPROFEN 600 MG PO TABS
600.0000 mg | ORAL_TABLET | Freq: Four times a day (QID) | ORAL | Status: DC
  Administered 2011-03-21 – 2011-03-23 (×10): 600 mg via ORAL
  Filled 2011-03-20 (×10): qty 1

## 2011-03-20 MED ORDER — ONDANSETRON HCL 4 MG/2ML IJ SOLN
4.0000 mg | INTRAMUSCULAR | Status: DC | PRN
  Administered 2011-03-21: 4 mg via INTRAVENOUS

## 2011-03-20 MED ORDER — FENTANYL CITRATE 0.05 MG/ML IJ SOLN: 25.0000 ug | INTRAMUSCULAR | Status: DC | PRN

## 2011-03-20 MED ORDER — ACETAMINOPHEN 325 MG PO TABS: 325.0000 mg | ORAL_TABLET | ORAL | Status: DC | PRN

## 2011-03-20 MED ORDER — DIPHENHYDRAMINE HCL 50 MG/ML IJ SOLN: 12.5000 mg | INTRAMUSCULAR | Status: DC | PRN

## 2011-03-20 MED ORDER — NALBUPHINE HCL 10 MG/ML IJ SOLN: 5.0000 mg | INTRAMUSCULAR | Status: DC | PRN

## 2011-03-20 MED ORDER — DEXAMETHASONE SODIUM PHOSPHATE 10 MG/ML IJ SOLN
INTRAMUSCULAR | Status: AC
  Filled 2011-03-20: qty 1

## 2011-03-20 MED ORDER — OXYTOCIN 10 UNIT/ML IJ SOLN
INTRAMUSCULAR | Status: AC
  Filled 2011-03-20: qty 1

## 2011-03-20 MED ORDER — PHENYLEPHRINE HCL 10 MG/ML IJ SOLN
INTRAMUSCULAR | Status: DC | PRN
  Administered 2011-03-20 (×2): 80 ug via INTRAVENOUS
  Administered 2011-03-20: 40 ug via INTRAVENOUS
  Administered 2011-03-20: 80 ug via INTRAVENOUS
  Administered 2011-03-20: 40 ug via INTRAVENOUS
  Administered 2011-03-20 (×5): 80 ug via INTRAVENOUS
  Administered 2011-03-20: 40 ug via INTRAVENOUS

## 2011-03-20 MED ORDER — SENNOSIDES-DOCUSATE SODIUM 8.6-50 MG PO TABS
2.0000 | ORAL_TABLET | Freq: Every day | ORAL | Status: DC
  Administered 2011-03-22: 2 via ORAL

## 2011-03-20 MED ORDER — DEXAMETHASONE SODIUM PHOSPHATE 10 MG/ML IJ SOLN
INTRAMUSCULAR | Status: DC | PRN
  Administered 2011-03-20: 10 mg via INTRAVENOUS

## 2011-03-20 MED ORDER — DIPHENHYDRAMINE HCL 50 MG/ML IJ SOLN: 25.0000 mg | INTRAMUSCULAR | Status: DC | PRN

## 2011-03-20 MED ORDER — PHENYLEPHRINE 40 MCG/ML (10ML) SYRINGE FOR IV PUSH (FOR BLOOD PRESSURE SUPPORT)
PREFILLED_SYRINGE | INTRAVENOUS | Status: AC
  Filled 2011-03-20: qty 5

## 2011-03-20 MED ORDER — MEPERIDINE HCL 25 MG/ML IJ SOLN: 6.2500 mg | INTRAMUSCULAR | Status: DC | PRN

## 2011-03-20 MED ORDER — PHENYLEPHRINE 40 MCG/ML (10ML) SYRINGE FOR IV PUSH (FOR BLOOD PRESSURE SUPPORT)
PREFILLED_SYRINGE | INTRAVENOUS | Status: AC
  Filled 2011-03-20: qty 15

## 2011-03-20 MED ORDER — DIPHENHYDRAMINE HCL 25 MG PO CAPS: 25.0000 mg | ORAL_CAPSULE | Freq: Four times a day (QID) | ORAL | Status: DC | PRN

## 2011-03-20 MED ORDER — OXYTOCIN 20 UNITS IN LACTATED RINGERS INFUSION - SIMPLE
125.0000 mL/h | INTRAVENOUS | Status: AC
  Filled 2011-03-20: qty 1000

## 2011-03-20 MED ORDER — PRENATAL PLUS 27-1 MG PO TABS
1.0000 | ORAL_TABLET | Freq: Every day | ORAL | Status: DC
  Administered 2011-03-22 – 2011-03-23 (×2): 1 via ORAL
  Filled 2011-03-20 (×2): qty 1

## 2011-03-20 MED ORDER — LANOLIN HYDROUS EX OINT: 1.0000 "application " | TOPICAL_OINTMENT | CUTANEOUS | Status: DC | PRN

## 2011-03-20 MED ORDER — MORPHINE SULFATE 0.5 MG/ML IJ SOLN
INTRAMUSCULAR | Status: AC
  Filled 2011-03-20: qty 10

## 2011-03-20 MED ORDER — CEFAZOLIN SODIUM 1-5 GM-% IV SOLN: 1.0000 g | INTRAVENOUS | Status: DC

## 2011-03-20 MED ORDER — DIBUCAINE 1 % RE OINT: 1.0000 "application " | TOPICAL_OINTMENT | RECTAL | Status: DC | PRN

## 2011-03-20 MED ORDER — METOCLOPRAMIDE HCL 5 MG/ML IJ SOLN
INTRAMUSCULAR | Status: DC | PRN
  Administered 2011-03-20: 10 mg via INTRAVENOUS

## 2011-03-20 MED ORDER — SCOPOLAMINE 1 MG/3DAYS TD PT72
MEDICATED_PATCH | TRANSDERMAL | Status: AC
  Filled 2011-03-20: qty 1

## 2011-03-20 MED ORDER — KETOROLAC TROMETHAMINE 30 MG/ML IJ SOLN
INTRAMUSCULAR | Status: AC
  Administered 2011-03-20: 30 mg via INTRAMUSCULAR
  Filled 2011-03-20: qty 1

## 2011-03-20 MED ORDER — OXYTOCIN 20 UNITS IN LACTATED RINGERS INFUSION - SIMPLE
INTRAVENOUS | Status: DC | PRN
  Administered 2011-03-20 (×2): 20 [IU] via INTRAVENOUS

## 2011-03-20 MED ORDER — MAGNESIUM HYDROXIDE 400 MG/5ML PO SUSP: 30.0000 mL | ORAL | Status: DC | PRN

## 2011-03-20 MED ORDER — ONDANSETRON HCL 4 MG PO TABS: 4.0000 mg | ORAL_TABLET | ORAL | Status: DC | PRN

## 2011-03-20 MED ORDER — METOCLOPRAMIDE HCL 5 MG/ML IJ SOLN: 10.0000 mg | Freq: Three times a day (TID) | INTRAMUSCULAR | Status: DC | PRN

## 2011-03-20 MED ORDER — TETANUS-DIPHTH-ACELL PERTUSSIS 5-2.5-18.5 LF-MCG/0.5 IM SUSP
0.5000 mL | Freq: Once | INTRAMUSCULAR | Status: AC
  Administered 2011-03-23: 0.5 mL via INTRAMUSCULAR
  Filled 2011-03-20: qty 0.5

## 2011-03-20 MED ORDER — SIMETHICONE 80 MG PO CHEW
80.0000 mg | CHEWABLE_TABLET | Freq: Three times a day (TID) | ORAL | Status: DC
  Administered 2011-03-21 – 2011-03-23 (×8): 80 mg via ORAL

## 2011-03-20 MED ORDER — SCOPOLAMINE 1 MG/3DAYS TD PT72
1.0000 | MEDICATED_PATCH | Freq: Once | TRANSDERMAL | Status: DC
  Administered 2011-03-20: 1.5 mg via TRANSDERMAL

## 2011-03-20 MED ORDER — PRENATAL PLUS 27-1 MG PO TABS: 1.0000 | ORAL_TABLET | Freq: Every day | ORAL | Status: DC

## 2011-03-20 MED ORDER — ZOLPIDEM TARTRATE 5 MG PO TABS: 5.0000 mg | ORAL_TABLET | Freq: Every evening | ORAL | Status: DC | PRN

## 2011-03-20 MED ORDER — SODIUM BICARBONATE 8.4 % IV SOLN
INTRAVENOUS | Status: DC | PRN
  Administered 2011-03-20: 5 mL via EPIDURAL

## 2011-03-20 MED ORDER — KETOROLAC TROMETHAMINE 30 MG/ML IJ SOLN: 15.0000 mg | Freq: Once | INTRAMUSCULAR | Status: DC | PRN

## 2011-03-20 MED ORDER — ACETAMINOPHEN 10 MG/ML IV SOLN: 1000.0000 mg | Freq: Four times a day (QID) | INTRAVENOUS | Status: AC | PRN

## 2011-03-20 MED ORDER — LIDOCAINE-EPINEPHRINE (PF) 2 %-1:200000 IJ SOLN
INTRAMUSCULAR | Status: AC
  Filled 2011-03-20: qty 20

## 2011-03-20 MED ORDER — PROMETHAZINE HCL 25 MG/ML IJ SOLN
INTRAMUSCULAR | Status: AC
  Filled 2011-03-20: qty 1

## 2011-03-20 MED ORDER — WITCH HAZEL-GLYCERIN EX PADS: 1.0000 "application " | MEDICATED_PAD | CUTANEOUS | Status: DC | PRN

## 2011-03-20 MED ORDER — MORPHINE SULFATE (PF) 0.5 MG/ML IJ SOLN
INTRAMUSCULAR | Status: DC | PRN
  Administered 2011-03-20: 3 mg via EPIDURAL

## 2011-03-20 MED ORDER — PROMETHAZINE HCL 25 MG/ML IJ SOLN
INTRAMUSCULAR | Status: DC | PRN
  Administered 2011-03-20 (×2): 12.5 mg via INTRAVENOUS

## 2011-03-20 MED ORDER — KETOROLAC TROMETHAMINE 30 MG/ML IJ SOLN
30.0000 mg | Freq: Four times a day (QID) | INTRAMUSCULAR | Status: AC | PRN
  Administered 2011-03-20: 30 mg via INTRAMUSCULAR

## 2011-03-20 MED ORDER — CEFAZOLIN SODIUM 1-5 GM-% IV SOLN
INTRAVENOUS | Status: DC | PRN
  Administered 2011-03-20: 1 g via INTRAVENOUS

## 2011-03-20 MED ORDER — KETOROLAC TROMETHAMINE 30 MG/ML IJ SOLN: 30.0000 mg | Freq: Four times a day (QID) | INTRAMUSCULAR | Status: AC | PRN

## 2011-03-20 MED ORDER — ALBUTEROL SULFATE HFA 108 (90 BASE) MCG/ACT IN AERS: 2.0000 | INHALATION_SPRAY | RESPIRATORY_TRACT | Status: DC | PRN

## 2011-03-20 MED ORDER — ONDANSETRON HCL 4 MG/2ML IJ SOLN
4.0000 mg | Freq: Three times a day (TID) | INTRAMUSCULAR | Status: DC | PRN
  Filled 2011-03-20: qty 2

## 2011-03-20 MED ORDER — IBUPROFEN 600 MG PO TABS: 600.0000 mg | ORAL_TABLET | Freq: Four times a day (QID) | ORAL | Status: DC | PRN

## 2011-03-20 MED ORDER — PROMETHAZINE HCL 25 MG/ML IJ SOLN: 6.2500 mg | INTRAMUSCULAR | Status: DC | PRN

## 2011-03-20 MED ORDER — OXYCODONE-ACETAMINOPHEN 5-325 MG PO TABS
1.0000 | ORAL_TABLET | ORAL | Status: DC | PRN
  Administered 2011-03-22: 2 via ORAL
  Administered 2011-03-22: 1 via ORAL
  Administered 2011-03-22 – 2011-03-23 (×2): 2 via ORAL
  Filled 2011-03-20 (×12): qty 2

## 2011-03-20 MED ORDER — MORPHINE SULFATE (PF) 0.5 MG/ML IJ SOLN
INTRAMUSCULAR | Status: DC | PRN
  Administered 2011-03-20: 2 mg via INTRAVENOUS

## 2011-03-20 MED ORDER — ONDANSETRON HCL 4 MG/2ML IJ SOLN
INTRAMUSCULAR | Status: AC
  Filled 2011-03-20: qty 2

## 2011-03-20 MED ORDER — ONDANSETRON HCL 4 MG/2ML IJ SOLN
INTRAMUSCULAR | Status: DC | PRN
  Administered 2011-03-20: 4 mg via INTRAVENOUS

## 2011-03-20 MED ORDER — CEFAZOLIN SODIUM 1-5 GM-% IV SOLN
INTRAVENOUS | Status: AC
  Filled 2011-03-20: qty 50

## 2011-03-20 MED ORDER — MEASLES, MUMPS & RUBELLA VAC ~~LOC~~ INJ: 0.5000 mL | INJECTION | Freq: Once | SUBCUTANEOUS | Status: DC

## 2011-03-20 MED ORDER — MENTHOL 3 MG MT LOZG: 1.0000 | LOZENGE | OROMUCOSAL | Status: DC | PRN

## 2011-03-20 MED ORDER — SIMETHICONE 80 MG PO CHEW: 80.0000 mg | CHEWABLE_TABLET | ORAL | Status: DC | PRN

## 2011-03-20 MED ORDER — DIPHENHYDRAMINE HCL 25 MG PO CAPS: 25.0000 mg | ORAL_CAPSULE | ORAL | Status: DC | PRN

## 2011-03-20 MED ORDER — NALOXONE HCL 0.4 MG/ML IJ SOLN: 0.4000 mg | INTRAMUSCULAR | Status: DC | PRN

## 2011-03-20 MED ORDER — FLUTICASONE PROPIONATE HFA 110 MCG/ACT IN AERO
2.0000 | INHALATION_SPRAY | Freq: Two times a day (BID) | RESPIRATORY_TRACT | Status: DC
  Administered 2011-03-20 – 2011-03-23 (×5): 2 via RESPIRATORY_TRACT
  Filled 2011-03-20: qty 12

## 2011-03-20 MED ORDER — METOCLOPRAMIDE HCL 5 MG/ML IJ SOLN
INTRAMUSCULAR | Status: AC
  Filled 2011-03-20: qty 2

## 2011-03-20 MED ORDER — SODIUM CHLORIDE 0.9 % IV SOLN
1.0000 ug/kg/h | INTRAVENOUS | Status: DC | PRN
  Filled 2011-03-20: qty 2.5

## 2011-03-20 SURGICAL SUPPLY — 31 items
CHLORAPREP W/TINT 26ML (MISCELLANEOUS) ×3 IMPLANT
CLOTH BEACON ORANGE TIMEOUT ST (SAFETY) ×3 IMPLANT
CONTAINER PREFILL 10% NBF 15ML (MISCELLANEOUS) IMPLANT
DRSG COVADERM 4X10 (GAUZE/BANDAGES/DRESSINGS) ×1 IMPLANT
ELECT REM PT RETURN 9FT ADLT (ELECTROSURGICAL) ×3
ELECTRODE REM PT RTRN 9FT ADLT (ELECTROSURGICAL) ×2 IMPLANT
EXTRACTOR VACUUM KIWI (MISCELLANEOUS) IMPLANT
EXTRACTOR VACUUM M CUP 4 TUBE (SUCTIONS) IMPLANT
GLOVE BIO SURGEON STRL SZ8 (GLOVE) ×3 IMPLANT
GLOVE ORTHO TXT STRL SZ7.5 (GLOVE) ×3 IMPLANT
GOWN PREVENTION PLUS LG XLONG (DISPOSABLE) ×6 IMPLANT
GOWN STRL REIN XL XLG (GOWN DISPOSABLE) ×3 IMPLANT
KIT ABG SYR 3ML LUER SLIP (SYRINGE) IMPLANT
NDL HYPO 25X5/8 SAFETYGLIDE (NEEDLE) ×2 IMPLANT
NEEDLE HYPO 25X5/8 SAFETYGLIDE (NEEDLE) IMPLANT
NS IRRIG 1000ML POUR BTL (IV SOLUTION) ×3 IMPLANT
PACK C SECTION WH (CUSTOM PROCEDURE TRAY) ×3 IMPLANT
RTRCTR C-SECT PINK 25CM LRG (MISCELLANEOUS) ×3 IMPLANT
SLEEVE SCD COMPRESS KNEE MED (MISCELLANEOUS) ×1 IMPLANT
STAPLER VISISTAT 35W (STAPLE) ×1 IMPLANT
SUT CHROMIC 1 CTX 36 (SUTURE) ×6 IMPLANT
SUT PLAIN 0 NONE (SUTURE) IMPLANT
SUT PLAIN 2 0 XLH (SUTURE) ×1 IMPLANT
SUT PROLENE 4 0 KS NDL (SUTURE) IMPLANT
SUT PROLENE 4 0 KS NEEDLE (SUTURE) IMPLANT
SUT VIC AB 0 CT1 27 (SUTURE) ×9
SUT VIC AB 0 CT1 27XBRD ANBCTR (SUTURE) ×4 IMPLANT
SUT VICRYL #0 CTB 1 (SUTURE) ×1 IMPLANT
TOWEL OR 17X24 6PK STRL BLUE (TOWEL DISPOSABLE) ×6 IMPLANT
TRAY FOLEY CATH 14FR (SET/KITS/TRAYS/PACK) ×2 IMPLANT
WATER STERILE IRR 1000ML POUR (IV SOLUTION) ×2 IMPLANT

## 2011-03-20 NOTE — Progress Notes (Signed)
Delivery of live viable female by Dr Jackelyn Knife. NICU at bedside

## 2011-03-20 NOTE — Progress Notes (Signed)
Comfortable with epidural Afeb, VSS, sl tachy FHT- Cat II, mod variability, some variable decels, ctx q 2-4 min VE- 6/50/-1, vtx Will continue pitocin and monitor progress.  Has a protracted course, hopefully entering active labor.

## 2011-03-20 NOTE — Transfer of Care (Signed)
  Anesthesia Post-op Note  Patient: [name] [name]  Procedure(s) Performed:  CESAREAN SECTION - Primary Female at 43; MYOMECTOMY  Patient Location: PACU  Anesthesia Type: Epidural  Level of Consciousness: alert , oriented and sedated  Airway and Oxygen Therapy: Patient Spontanous Breathing  Post-op Pain: none  Post-op Assessment: Post-op Vital signs reviewed and Patient's Cardiovascular Status Stable  Post-op Vital Signs: Reviewed and stable  Complications: No apparent anesthesia complications

## 2011-03-20 NOTE — Progress Notes (Signed)
Dr Jackelyn Knife at bedside assessing pt. Discussing risks and benefits of C/S. Pt verbalizes understanding and has no further questions. CHG wipes completed for Cs

## 2011-03-20 NOTE — Anesthesia Postprocedure Evaluation (Signed)
Anesthesia Post Note  Patient: [name] [name]  Procedure(s) Performed:  CESAREAN SECTION - Primary Female at 4; MYOMECTOMY   Patient is awake, responsive, moving her legs, and has signs of resolution of her numbness. Pain and nausea are reasonably well controlled. Vital signs are stable and clinically acceptable. Oxygen saturation is clinically acceptable. There are no apparent anesthetic complications at this time. Patient is ready for discharge.

## 2011-03-20 NOTE — Progress Notes (Signed)
In OR room 1

## 2011-03-20 NOTE — Transfer of Care (Signed)
  Anesthesia Post-op Note  

## 2011-03-20 NOTE — Consult Note (Signed)
Called to attend an urgent C/S for arrest of dilatation at term gestation with no other risk factors reported.    At delivery infant in vertex and was manually extracted with spontaneous cry and active movement of all extremities.  Infant given tactile stimulation and bulb suction.  No dysmorphic features.    Shown to parents and L/D RN took responsibility to take infant to Transitional Nursery once bracelets had been obtained.   Dagoberto Ligas MD [facility]

## 2011-03-20 NOTE — Progress Notes (Signed)
[name] [name] is a 35 y.o. G1P0000 at [redacted]w[redacted]d by LMP admitted for rupture of membranes, PROM  Subjective:   Objective: BP 115/63  Pulse 102  Temp(Src) 98.2 F (36.8 C) (Oral)  Resp 20  Ht 4\' 10"  (1.473 m)  Wt 81.194 kg (179 lb)  BMI 37.41 kg/m2  SpO2 100%      FHT:  140 with some decelerations intermittent good variability UC:   irregular, every 2-3 MINUTES   , SVE:   Dilation: 4 Effacement (%): 70 Station: -2 Exam by:: D biggs, RN  Labs: Lab Results  Component Value Date   WBC 10.4 03/19/2011   HGB 11.2* 03/19/2011   HCT 34.7* 03/19/2011   MCV 86.3 03/19/2011   PLT 284 03/19/2011    Assessment / Plan: Will continue to observe for progress   Labor:ACTIVE Preeclampsia:NONE   Fetal Wellbeing:ACCEPTABLE  Pain Control:ADEQUATE EPIDURAL I/D:  PREGNANCY Anticipated MOD: C-SECTION   [name] [name] F 03/20/2011, 4:41 AM

## 2011-03-20 NOTE — Progress Notes (Signed)
Comfortable  Afeb, VSS FHT- Cat II, unchanged, ctx q 2-4 min VE- unchanged, cervix more edematous Discussed arrest of dilation, recommended c-section.  Discussed procedure and risks.  Will proceed with c-section.

## 2011-03-20 NOTE — Progress Notes (Signed)
Feeling some pressure Afeb, VSS FHT- Cat II, about the same, ctx q 2-4 min VE- 7-8/70/0, vtx, anterior cervix is thicker than the rest Protracted labor, FHT ok, continue pitocin and monitor progress

## 2011-03-20 NOTE — Progress Notes (Signed)
In or room 1

## 2011-03-20 NOTE — Progress Notes (Signed)
Dr Jackelyn Knife notified of pt status, FHr tracing with minimal varibility, SVE with edematous cervix, and UCs. Continue with POC for 2 hours, provider then to reevaluate

## 2011-03-20 NOTE — Op Note (Signed)
Preoperative diagnosis: Intrauterine pregnancy at 40 weeks, arrest of dilation, fibroid uterus Postoperative diagnosis: Same Procedure: Primary low transverse cesarean section, myomectomy Surgeon: Lavina Hamman M.D. Anesthesia: Epidural Findings: There was a large pedunculated fibroid coming from the left anterior fundus. It was adherent to the left anterior abdominal wall. She delivered a viable female infant with Apgars of 9 and 9 weight 7 lbs. 2 oz. Specimens: Placenta sent to labor and delivery, fibroid sent to pathology Estimated blood loss: 800 cc Complications: None Procedure in detail: The patient was taken to the operating room and placed in the dorsosupine position with left lateral tilt. Her previously placed epidural was dosed appropriately. At about the time her epidural was dosed I had to a assist with a stat C-section for twins. As soon as those babies were delivered I was able to reenter the room. Abdomen was prepped and draped in the usual sterile fashion, a Foley catheter had previously been placed. The level of her anesthesia was found to be adequate. Abdomen was entered via a standard Pfannenstiel incision without difficulty. Upon entering the peritoneal cavity the uterus was noted to be significantly rotated to the left. The Alexis disposable self-retaining retractor was placed. A moist lap pad was used to pack the bowel out of the field on the left side. As best I could I rotated the uterus to the right side although this was difficult as at this point the fibroid was still adherent to the anterior abdominal wall. A 4 cm transverse incision was then made in the lower uterine segment. Once the uterine cavity was entered the incision was extended digitally. The fetal vertex was grasped and delivered through the incision atraumatically. Mouth and nares were suctioned. The remainder of the infant then delivered atraumatically. Cord was doubly clamped and cut and the infant handed to the  waiting pediatric team. Cord blood was obtained. The placenta delivered spontaneously. Uterus was wiped clean lap pad and all clots and debris were removed. Uterine incision was inspected and found to be free of extensions. Uterine incision was closed in one layer being a running locking layer with #1 chromic with adequate closure and adequate hemostasis. Right tube and ovary were easily visualized and were normal. I was able to bluntly free most of the adhesions of the fibroid to the anterior abdominal wall and bring it to the incision. At this point is felt to be a pedunculated fibroid amenable to removal. Electrocautery was used to remove the fibroid from the uterus. The fairly superficial defect in the uterus was then closed with 2 layers of 0 Vicryl interrupted sutures to obtain closure and hemostasis. Left tube and ovary were inspected and found to be normal. Omentum was inspected as it had been adherent to the fibroid and bleeding was controlled with electrocautery. Uterine incision was inspected and found to be hemostatic. The lap pad and Alexis retractor were removed. Peritoneum was closed with interrupted sutures of 0 Vicryl due to bowel persistently prolapsing anterior to the uterus. Subfascial space was irrigated and made hemostatic with electrocautery. Fascia was closed in running fashion starting at both ends and meeting in the middle with 0 Vicryl. Subcutaneous tissue was then irrigated and made hemostatic with electrocautery. Subcutaneous tissue was closed with running 2-0 plain gut suture. Skin was closed staples followed by sterile dressing. Patient tolerated procedure well and was taken to the recovery in stable condition. Counts were correct x2, she received Ancef 1 g IV the beginning of the procedure, she had PAS  hose on throughout the procedure.

## 2011-03-21 DIAGNOSIS — Z98891 History of uterine scar from previous surgery: Secondary | ICD-10-CM

## 2011-03-21 LAB — CBC
HCT: 28.2 % — ABNORMAL LOW (ref 36.0–46.0)
MCHC: 32.6 g/dL (ref 30.0–36.0)
MCV: 86.5 fL (ref 78.0–100.0)
Platelets: 269 10*3/uL (ref 150–400)
RDW: 14.9 % (ref 11.5–15.5)

## 2011-03-21 MED ORDER — LACTATED RINGERS IV SOLN
INTRAVENOUS | Status: DC
  Administered 2011-03-21 (×2): via INTRAVENOUS

## 2011-03-21 NOTE — Progress Notes (Signed)
UR Chart review completed.  

## 2011-03-21 NOTE — Anesthesia Postprocedure Evaluation (Signed)
  Anesthesia Post-op Note  Patient: [name] [name]  Procedure(s) Performed:  CESAREAN SECTION - Primary Female at 33; MYOMECTOMY  Patient Location: PACU and Mother/Baby  Anesthesia Type: Epidural  Level of Consciousness: awake, alert  and oriented  Airway and Oxygen Therapy: Patient Spontanous Breathing  Post-op Pain: none  Post-op Assessment: Post-op Vital signs reviewed, Patient's Cardiovascular Status Stable, No headache, No backache, No residual numbness and No residual motor weakness  Post-op Vital Signs: Reviewed and stable  Complications: No apparent anesthesia complications

## 2011-03-21 NOTE — Progress Notes (Signed)
Subjective: Postpartum Day D#1: Cesarean Delivery and myomectomy Patient reports tolerating PO.    Objective: Vital signs in last 24 hours: Temp:  [97 F (36.1 C)-100.5 F (38.1 C)] 98.5 F (36.9 C) (08/24 0630) Pulse Rate:  [98-126] 103  (08/24 0630) Resp:  [16-27] 20  (08/24 0630) BP: (87-142)/(48-90) 103/63 mmHg (08/24 0630) SpO2:  [93 %-100 %] 100 % (08/24 0630)  Physical Exam:  General: alert Uterine Fundus: firm, abdomen slightly distended Incision: dressing C/D/I   Basename 03/21/11 0530 03/19/11 1225  HGB 9.2* 11.2*  HCT 28.2* 34.7*    Assessment/Plan: Status post Cesarean section. Doing well postoperatively. Abd slightly distended, had to handle bowel more than usual due to myomectomy.  Had slight fever right after surgery  Will ambulate, advance diet as tolerated, but is at risk for ileus.  Zak Gondek D 03/21/2011, 8:27 AM

## 2011-03-21 NOTE — Progress Notes (Signed)
Encounter addended by: Madison Hickman on: 03/21/2011  8:17 AM<BR>     Documentation filed: Notes Section

## 2011-03-22 NOTE — Progress Notes (Signed)
Subjective: Postpartum Day 2: Cesarean Delivery Patient reports vomiting, incisional pain and + flatus.  Overnight had large emesis, backed off po, increased IVF.  Feeling better now.  Will take it slow.    Objective: Vital signs in last 24 hours: Temp:  [98 F (36.7 C)-98.8 F (37.1 C)] 98.8 F (37.1 C) (08/25 0621) Pulse Rate:  [94-106] 99  (08/25 0621) Resp:  [20] 20  (08/25 0621) BP: (92-124)/(56-79) 119/74 mmHg (08/25 0621) SpO2:  [100 %] 100 % (08/24 1915)  Physical Exam:  General: alert and no distress Lochia: appropriate Uterine Fundus: firm Incision: healing well DVT Evaluation: No evidence of DVT seen on physical exam.   Basename 03/21/11 0530 03/19/11 1225  HGB 9.2* 11.2*  HCT 28.2* 34.7*    Assessment/Plan: Status post Cesarean section. Doing well postoperatively. likely resolving ileus, + flatus Continue current care, monitor closely.  BOVARD,Alaine Loughney 03/22/2011, 8:39 AM

## 2011-03-22 NOTE — Anesthesia Postprocedure Evaluation (Signed)
  Anesthesia Post-op Note  Patient: [name] [name]  Procedure(s) Performed:  CESAREAN SECTION - Primary Female at 55; MYOMECTOMY  Patient Location: PACU and Mother/Baby  Anesthesia Type: Epidural  Level of Consciousness: awake, alert  and oriented  Airway and Oxygen Therapy: Patient Spontanous Breathing  Post-op Pain: mild  Post-op Assessment: Patient's Cardiovascular Status Stable, Respiratory Function Stable, Patent Airway, No signs of Nausea or vomiting, Adequate PO intake and Pain level controlled  Post-op Vital Signs: stable  Complications: No apparent anesthesia complications

## 2011-03-23 MED ORDER — OXYCODONE-ACETAMINOPHEN 5-325 MG PO TABS: 1.0000 | ORAL_TABLET | ORAL | Status: AC | PRN

## 2011-03-23 MED ORDER — PRENATAL PLUS 27-1 MG PO TABS: 1.0000 | ORAL_TABLET | Freq: Every day | ORAL | Status: DC

## 2011-03-23 MED ORDER — IBUPROFEN 600 MG PO TABS: 600.0000 mg | ORAL_TABLET | Freq: Four times a day (QID) | ORAL | Status: AC

## 2011-03-23 NOTE — Discharge Summary (Signed)
Obstetric Discharge Summary Reason for Admission: induction of labor Prenatal Procedures: none Intrapartum Procedures: cesarean: low cervical, transverse, myomectomy Postpartum Procedures: none, some post op N&V Complications-Operative and Postpartum: none Hemoglobin  Date Value Range Status  03/21/2011 9.2* 12.0-15.0 (g/dL) Final     DELTA CHECK NOTED     REPEATED TO VERIFY     HCT  Date Value Range Status  03/21/2011 28.2* 36.0-46.0 (%) Final    Discharge Diagnoses: Term Pregnancy-delivered  Discharge Information: Date: 03/23/2011 Activity: pelvic rest Diet: routine Medications: PNV, Ibuprophen and Percocet Condition: stable Instructions: refer to practice specific booklet Discharge to: home Follow-up Information    Follow up with MEISINGER,TODD D. Call in 4 days. (for staple removal, in 2 weeks for incision check)    Contact information:   [address] [place] [address] [phone]          Newborn Data: Live born female  Birth Weight: 7 lb 2.8 oz (3255 g) APGAR: 9, 9  Home with mother, stay as baby patient.  BOVARD,Vickey Ewbank 03/23/2011, 10:13 AM

## 2011-03-23 NOTE — Progress Notes (Signed)
Subjective: Postpartum Day 3: Cesarean Delivery Patient reports incisional pain, tolerating PO, + flatus and no problems voiding.  Nl lochia pain controlled  Objective: Vital signs in last 24 hours: Temp:  [98 F (36.7 C)-98.5 F (36.9 C)] 98.2 F (36.8 C) (08/26 0550) Pulse Rate:  [88-94] 90  (08/26 0550) Resp:  [18-20] 18  (08/26 0550) BP: (95-103)/(63-68) 103/68 mmHg (08/26 0550)  Physical Exam:  General: alert and no distress Lochia: appropriate Uterine Fundus: firm Incision: healing well, C/D/I DVT Evaluation: No evidence of DVT seen on physical exam.   Basename 03/21/11 0530  HGB 9.2*  HCT 28.2*    Assessment/Plan: Status post Cesarean section. Doing well postoperatively.  Discharge home with standard precautions and return to clinic this week for staple removal.  D/c with Motrin, Vicodin, PNV.  [name] [name],[name] [name] 03/23/2011, 10:05 AM

## 2011-03-24 ENCOUNTER — Inpatient Hospital Stay (HOSPITAL_COMMUNITY)
Admission: AD | Admit: 2011-03-24 | Discharge: 2011-03-24 | Disposition: A | Discharge status: Home / Self Care | Payer: Medicare Other | Source: Ambulatory Visit | Attending: Obstetrics and Gynecology | Admitting: Obstetrics and Gynecology

## 2011-03-24 DIAGNOSIS — J45909 Unspecified asthma, uncomplicated: Secondary | ICD-10-CM | POA: Insufficient documentation

## 2011-03-24 DIAGNOSIS — F3289 Other specified depressive episodes: Secondary | ICD-10-CM | POA: Insufficient documentation

## 2011-03-24 DIAGNOSIS — G8918 Other acute postprocedural pain: Secondary | ICD-10-CM | POA: Insufficient documentation

## 2011-03-24 DIAGNOSIS — Z79899 Other long term (current) drug therapy: Secondary | ICD-10-CM | POA: Insufficient documentation

## 2011-03-24 DIAGNOSIS — G809 Cerebral palsy, unspecified: Secondary | ICD-10-CM | POA: Insufficient documentation

## 2011-03-24 DIAGNOSIS — K581 Irritable bowel syndrome with constipation: Secondary | ICD-10-CM

## 2011-03-24 DIAGNOSIS — K589 Irritable bowel syndrome without diarrhea: Secondary | ICD-10-CM

## 2011-03-24 DIAGNOSIS — R109 Unspecified abdominal pain: Secondary | ICD-10-CM | POA: Insufficient documentation

## 2011-03-24 DIAGNOSIS — F329 Major depressive disorder, single episode, unspecified: Secondary | ICD-10-CM | POA: Insufficient documentation

## 2011-03-24 MED ORDER — KETOROLAC TROMETHAMINE 30 MG/ML IJ SOLN
30.0000 mg | Freq: Four times a day (QID) | INTRAMUSCULAR | Status: DC | PRN
  Administered 2011-03-24: 30 mg via INTRAMUSCULAR
  Filled 2011-03-24: qty 1

## 2011-03-24 MED ORDER — BISACODYL 10 MG RE SUPP
10.0000 mg | Freq: Every day | RECTAL | Status: DC | PRN
  Administered 2011-03-24: 10 mg via RECTAL
  Filled 2011-03-24: qty 1

## 2011-03-24 MED ORDER — PROMETHAZINE HCL 25 MG PO TABS
25.0000 mg | ORAL_TABLET | Freq: Once | ORAL | Status: AC
  Administered 2011-03-24: 25 mg via ORAL
  Filled 2011-03-24: qty 1

## 2011-03-24 NOTE — Progress Notes (Signed)
Pt taken back to rooming in room per husband in wheelchair.

## 2011-03-24 NOTE — Progress Notes (Signed)
Pt post Cesarean Section 03/20/2011, having upper abd pain and feeling hot.  Pt took percocet 2 tabs at 2200 and motrin 600mg  at 2300.  Pt continues to have pain.

## 2011-03-24 NOTE — Progress Notes (Signed)
D. Poe, CNM at bedside.  Assessment done and poc discussed with pt.  

## 2011-03-24 NOTE — ED Provider Notes (Signed)
[name] [name] is a 35 y.o. G1P1001 at @POD  4 LTCS/myomectomy Chief Complaint  Patient presents with  . Abdominal Pain  . Postpartum Complications  SUBJECTIVE:  She describes constant rt and lt upper quadrant abd tightness and pain that was unrelieved by Percocet at 2200 and ibuprofen 600 at 2300. Nauseated about an hour ago but that has resolved. She has had no vomiting for 2 days. She is tolerating a regular diet. Has flatus but no bowel movement postoperatively. She reports that she has irritable bowel syndrome which is associated with constipation at times. She was given some type of pills for constipation postoperatively but had no result. She denies dysuria, urgency, frequency or back pain. No diarrhea. Past Medical History  Diagnosis Date  . Abnormal Pap smear   . Asthma   . Fibroid   . 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 Depression     no medication treatment   Also reports hx IBS.  Current Facility-Administered Medications on File Prior to Encounter  Medication Dose Route Frequency Provider Last Rate Last Dose  . TDaP (BOOSTRIX) injection 0.5 mL  0.5 mL Intramuscular Once Todd D Meisinger   0.5 mL at 03/23/11 1043  . DISCONTD: albuterol (PROVENTIL HFA;VENTOLIN HFA) inhaler 2 puff  2 puff Inhalation Q4H PRN Leighton Roach Meisinger      . DISCONTD: dibucaine (NUPERCAINAL) 1 % rectal ointment 1 application  1 application Rectal PRN Leighton Roach Meisinger      . DISCONTD: diphenhydrAMINE (BENADRYL) capsule 25 mg  25 mg Oral Q4H PRN Velna Hatchet      . DISCONTD: diphenhydrAMINE (BENADRYL) capsule 25 mg  25 mg Oral Q6H PRN Leighton Roach Meisinger      . DISCONTD: diphenhydrAMINE (BENADRYL) injection 12.5 mg  12.5 mg Intravenous Q4H PRN Velna Hatchet      . DISCONTD: diphenhydrAMINE (BENADRYL) injection 25 mg  25 mg Intramuscular Q4H PRN Velna Hatchet      . DISCONTD: fluticasone (FLOVENT HFA) 110 MCG/ACT inhaler 2 puff  2 puff Inhalation BID Leighton Roach Meisinger    2 puff at 03/23/11 1130  . DISCONTD: ibuprofen (ADVIL,MOTRIN) tablet 600 mg  600 mg Oral Q6H Todd D Meisinger   600 mg at 03/23/11 1132  . DISCONTD: lactated ringers infusion   Intravenous Continuous Sherron Monday, MD 125 mL/hr at 03/21/11 2356    . DISCONTD: lanolin ointment 1 application  1 application Topical PRN Todd D Meisinger      . DISCONTD: magnesium hydroxide (MILK OF MAGNESIA) suspension 30 mL  30 mL Oral Q3 days PRN Leighton Roach Meisinger      . DISCONTD: menthol-cetylpyridinium (CEPACOL) lozenge 3 mg  1 lozenge Oral Q2H PRN Leighton Roach Meisinger      . DISCONTD: metoCLOPramide (REGLAN) injection 10 mg  10 mg Intravenous Q8H PRN Velna Hatchet      . DISCONTD: nalbuphine (NUBAIN) injection 5-10 mg  5-10 mg Subcutaneous Q4H PRN Velna Hatchet      . DISCONTD: nalbuphine (NUBAIN) injection 5-10 mg  5-10 mg Intravenous Q4H PRN Velna Hatchet      . DISCONTD: naloxone (NARCAN) 1 mg in sodium chloride 0.9 % 250 mL infusion  1-4 mcg/kg/hr Intravenous Continuous PRN Velna Hatchet      . DISCONTD: naloxone [facility]) injection 0.4 mg  0.4 mg Intravenous PRN Velna Hatchet      . DISCONTD: ondansetron (ZOFRAN) injection 4 mg  4 mg  Intravenous Q8H PRN Velna Hatchet      . DISCONTD: ondansetron (ZOFRAN) injection 4 mg  4 mg Intravenous Q4H PRN Leighton Roach Meisinger   4 mg at 03/21/11 1926  . DISCONTD: ondansetron (ZOFRAN) tablet 4 mg  4 mg Oral Q4H PRN Leighton Roach Meisinger      . DISCONTD: oxyCODONE-acetaminophen (PERCOCET) 5-325 MG per tablet 1-2 tablet  1-2 tablet Oral Q3H PRN Leighton Roach Meisinger   2 tablet at 03/23/11 0604  . DISCONTD: prenatal vitamin w/FE, FA (PRENATAL 1 + 1) 27-1 MG tablet 1 tablet  1 tablet Oral Daily Leighton Roach Meisinger   1 tablet at 03/23/11 1042  . DISCONTD: scopolamine (TRANSDERM-SCOP) 1.5 MG 1.5 mg  1 patch Transdermal Once Velna Hatchet   1.5 mg at 03/20/11 2043  . DISCONTD: senna-docusate (Senokot-S) tablet 2 tablet  2 tablet Oral QHS Leighton Roach Meisinger   2 tablet at 03/22/11  2122  . DISCONTD: simethicone (MYLICON) chewable tablet 80 mg  80 mg Oral TID PC & HS Todd D Meisinger   80 mg at 03/23/11 1043  . DISCONTD: simethicone (MYLICON) chewable tablet 80 mg  80 mg Oral PRN Leighton Roach Meisinger      . DISCONTD: sodium chloride 0.9 % injection 3 mL  3 mL Intravenous PRN Velna Hatchet      . DISCONTD: witch hazel-glycerin (TUCKS) pad 1 application  1 application Topical PRN Todd D Meisinger      . DISCONTD: zolpidem (AMBIEN) tablet 5 mg  5 mg Oral QHS PRN Leighton Roach Meisinger       Current Outpatient Prescriptions on File Prior to Encounter  Medication Sig Dispense Refill  . fluticasone (FLOVENT HFA) 110 MCG/ACT inhaler Inhale 2 puffs into the lungs 2 (two) times daily. 1 mdi  1 Inhaler  1  . ibuprofen (ADVIL,MOTRIN) 600 MG tablet Take 1 tablet (600 mg total) by mouth every 6 (six) hours.  40 tablet  1  . oxyCODONE-acetaminophen (PERCOCET) 5-325 MG per tablet Take 1-2 tablets by mouth every 3 (three) hours as needed (moderate - severe pain).  30 tablet  0  . prenatal vitamin w/FE, FA (PRENATAL 1 + 1) 27-1 MG TABS Take 1 tablet by mouth daily.  100 each  3  . albuterol (PROAIR HFA) 108 (90 BASE) MCG/ACT inhaler Inhale 2 puffs into the lungs every 4 (four) hours as needed for wheezing. Up to 4 times a day.  1 Inhaler  1  . diphenhydrAMINE (BENADRYL) 25 MG tablet Take 25 mg by mouth at bedtime as needed. For allergies         OBJECTIVE:  Filed Vitals:   03/24/11 0115  BP: 113/70  Pulse: 90  Temp: 98.5 F (36.9 C)  Resp: 18    Abd: Obese. There is mild to moderate tenderness to deep palpation throughout upper abdomen which appears somewhat distended .Bowel sounds are very diminished there is no guarding or rebound. Minimal fundal tenderness and uterus feels mildly sub-involuted. Incision clean dry and intact with staples.  ASSESSMENT: Postoperative pain not controlled. Constipation and abd distention.   PLAN: Discussed with Dr. Ellyn Hack. Will give Toradol, Phenergan,  Dulcolax, then resueme Phenergan.

## 2011-04-08 ENCOUNTER — Encounter (HOSPITAL_COMMUNITY): Payer: Self-pay | Admitting: Obstetrics and Gynecology

## 2011-06-17 ENCOUNTER — Encounter: Payer: Medicare Other | Admitting: Family Medicine

## 2011-07-07 NOTE — Telephone Encounter (Signed)
Preadmission screen  

## 2011-10-13 ENCOUNTER — Encounter: Payer: Self-pay | Admitting: Home Health Services

## 2012-12-15 ENCOUNTER — Ambulatory Visit (INDEPENDENT_AMBULATORY_CARE_PROVIDER_SITE_OTHER): Payer: Medicare Other | Admitting: Family Medicine

## 2012-12-15 ENCOUNTER — Encounter: Payer: Self-pay | Admitting: Family Medicine

## 2012-12-15 VITALS — BP 157/103 | HR 89 | Temp 98.4°F | Ht <= 58 in | Wt 180.1 lb

## 2012-12-15 DIAGNOSIS — J45909 Unspecified asthma, uncomplicated: Secondary | ICD-10-CM | POA: Diagnosis not present

## 2012-12-15 DIAGNOSIS — R030 Elevated blood-pressure reading, without diagnosis of hypertension: Secondary | ICD-10-CM

## 2012-12-15 MED ORDER — ALBUTEROL SULFATE HFA 108 (90 BASE) MCG/ACT IN AERS: 2.0000 | INHALATION_SPRAY | RESPIRATORY_TRACT | Status: DC | PRN

## 2012-12-15 NOTE — Progress Notes (Signed)
Subjective:     Patient ID: [name] [name], female   DOB: 02/27/1976, 37 y.o.   MRN: [id number]  HPI 37 yo F non-smoker presents with nightly SOB x one week. Symptoms associated with wheezing and cough. No fever. She is fine during the day. She has not used albuterol bc she has not had it.  Elevated BP: has been elevated in the past. Usually normal. Non smoker. Drinks one glass of wine night. Reports low salt diet. Does not exercise. Does have poor vision at baseline. Denies CP. Some SOB with wheezing.   Review of Systems As per HPI     Objective:   Physical Exam BP 157/103  Pulse 89  Temp(Src) 98.4 F (36.9 C) (Oral)  Ht 4\' 9"  (1.448 m)  Wt 180 lb 2 oz (81.704 kg)  BMI 38.97 kg/m2  SpO2 99%  LMP 12/05/2012  Breastfeeding? No Eyes: conjunctivae/corneas clear. PERRL, EOM's intact.  Ears: normal TM's and external ear canals both ears Nose: Nares normal. Septum midline. Mucosa normal. No drainage or sinus tenderness. Throat: lips, mucosa, and tongue normal; teeth and gums normal Neck: no adenopathy, no carotid bruit, no JVD, supple, symmetrical, trachea midline and thyroid not enlarged, symmetric, no tenderness/mass/nodules Lungs: normal wob, diffused exp wheezing, no crackles or rales Hearts: S1S2, RRR, no MRG     Assessment and Plan:

## 2012-12-15 NOTE — Assessment & Plan Note (Addendum)
A: BP elevated. Has been elevated in the past.  P: Monitor BP at home. Bring log to three week f/u. If BP still elevated, recommend screening for DM2, check BMP. Start thiazide diuretic or CCB.

## 2012-12-15 NOTE — Assessment & Plan Note (Signed)
A: restart albuterol. Schedule it for a few days P: f/u in 3 weeks to assess control and need for controller medication.

## 2012-12-15 NOTE — Patient Instructions (Addendum)
Ms. [name] [name],  Thank you for coming in today. Your are wheezing. Please start albuterol. Take at night and twice daily for next 5 days then as needed.  For elevated BP: Monitor BP at stores: keep a log F/u next month with log. You may see Dr. Ashley Royalty and make the appt near your daughters, myself or whoever is convenient if Dr. Deirdre Priest is not available.

## 2012-12-16 ENCOUNTER — Encounter: Payer: Self-pay | Admitting: Family Medicine

## 2013-01-05 ENCOUNTER — Ambulatory Visit: Payer: Medicare Other | Admitting: Family Medicine

## 2013-01-08 ENCOUNTER — Emergency Department (HOSPITAL_COMMUNITY)
Admission: EM | Admit: 2013-01-08 | Discharge: 2013-01-08 | Disposition: A | Discharge status: Home / Self Care | Payer: Medicare Other | Attending: Emergency Medicine | Admitting: Emergency Medicine

## 2013-01-08 ENCOUNTER — Emergency Department (HOSPITAL_COMMUNITY): Payer: Medicare Other

## 2013-01-08 ENCOUNTER — Encounter (HOSPITAL_COMMUNITY): Payer: Self-pay | Admitting: Emergency Medicine

## 2013-01-08 DIAGNOSIS — Z760 Encounter for issue of repeat prescription: Secondary | ICD-10-CM | POA: Diagnosis not present

## 2013-01-08 DIAGNOSIS — M25579 Pain in unspecified ankle and joints of unspecified foot: Secondary | ICD-10-CM | POA: Diagnosis not present

## 2013-01-08 DIAGNOSIS — S93409A Sprain of unspecified ligament of unspecified ankle, initial encounter: Secondary | ICD-10-CM | POA: Insufficient documentation

## 2013-01-08 DIAGNOSIS — X500XXA Overexertion from strenuous movement or load, initial encounter: Secondary | ICD-10-CM | POA: Insufficient documentation

## 2013-01-08 DIAGNOSIS — Z8742 Personal history of other diseases of the female genital tract: Secondary | ICD-10-CM | POA: Diagnosis not present

## 2013-01-08 DIAGNOSIS — Y9301 Activity, walking, marching and hiking: Secondary | ICD-10-CM | POA: Insufficient documentation

## 2013-01-08 DIAGNOSIS — Y929 Unspecified place or not applicable: Secondary | ICD-10-CM | POA: Insufficient documentation

## 2013-01-08 DIAGNOSIS — S93401A Sprain of unspecified ligament of right ankle, initial encounter: Secondary | ICD-10-CM

## 2013-01-08 DIAGNOSIS — Z79899 Other long term (current) drug therapy: Secondary | ICD-10-CM | POA: Insufficient documentation

## 2013-01-08 DIAGNOSIS — Z8659 Personal history of other mental and behavioral disorders: Secondary | ICD-10-CM | POA: Insufficient documentation

## 2013-01-08 DIAGNOSIS — Z8669 Personal history of other diseases of the nervous system and sense organs: Secondary | ICD-10-CM | POA: Insufficient documentation

## 2013-01-08 DIAGNOSIS — J45909 Unspecified asthma, uncomplicated: Secondary | ICD-10-CM | POA: Diagnosis not present

## 2013-01-08 DIAGNOSIS — M7989 Other specified soft tissue disorders: Secondary | ICD-10-CM | POA: Diagnosis not present

## 2013-01-08 MED ORDER — ALBUTEROL SULFATE HFA 108 (90 BASE) MCG/ACT IN AERS
2.0000 | INHALATION_SPRAY | RESPIRATORY_TRACT | Status: DC
  Administered 2013-01-08: 2 via RESPIRATORY_TRACT
  Filled 2013-01-08: qty 6.7

## 2013-01-08 NOTE — ED Notes (Signed)
Pt states that a few days ago, she was walking, and her ankle "popped".  States that last night it started swelling.

## 2013-01-08 NOTE — ED Notes (Signed)
Pt reports hx of asthma, unable to get albuterol inhaler filled at pharmacy. Inhaler given to pt in ED

## 2013-01-08 NOTE — ED Provider Notes (Signed)
History     CSN: [id number]  Arrival date & time 01/08/13  0904   First MD Initiated Contact with Patient 01/08/13 [phone]      Chief Complaint  Patient presents with  . Ankle Pain    (Consider location/radiation/quality/duration/timing/severity/associated sxs/prior treatment) Patient is a 37 y.o. female presenting with ankle pain. The history is provided by the patient.  Ankle Pain Location:  Ankle Time since incident:  2 days Injury: yes   Ankle location:  R ankle Pain details:    Quality:  Dull   Severity:  Moderate   Onset quality:  Sudden   Timing:  Constant   Progression:  Unchanged Chronicity:  New Dislocation: no   Prior injury to area:  No Worsened by:  Nothing tried Ineffective treatments:  None tried pt injured her right ankle 2 days ago after stepping incorrectly--  Past Medical History  Diagnosis Date  . Abnormal Pap smear   . Asthma   . Fibroid   . Depression     no medication treatment   . 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 Mouth surgery      37 years old  . Cesarean section  03/20/2011    Procedure: CESAREAN SECTION;  Surgeon: Leighton Roach Meisinger;  Location: [facility];  Service: Gynecology;  Laterality: N/A;  Primary Female at 5  . Myomectomy  03/20/2011    Procedure: MYOMECTOMY;  Surgeon: Leighton Roach Meisinger;  Location: [facility];  Service: Gynecology;  Laterality: N/A;    Family History  Problem Relation Age of Onset  . Birth defects Cousin     Down Syndrome  . Anesthesia problems Mother     History  Substance Use Topics  . Smoking status: Never Smoker   . Smokeless tobacco: Not on file  . Alcohol Use: 0.6 oz/week    1 Glasses of wine per week     Comment: 1 glass wine per day until known pregnancy at approx 12 weeks    OB History   Grav Para Term Preterm Abortions TAB SAB Ect Mult Living   1 1 1  0 0 0 0 0 0 1      Review of Systems  All other systems  reviewed and are negative.    Allergies  Pineapple and Tomato  Home Medications   Current Outpatient Rx  Name  Route  Sig  Dispense  Refill  . albuterol (PROVENTIL HFA;VENTOLIN HFA) 108 (90 BASE) MCG/ACT inhaler   Inhalation   Inhale 2 puffs into the lungs every 6 (six) hours as needed for wheezing or shortness of breath.         . diphenhydrAMINE (BENADRYL) 25 MG tablet   Oral   Take 25 mg by mouth at bedtime as needed. For allergies            BP 144/99  Pulse 98  Temp(Src) 99 F (37.2 C)  Resp 18  SpO2 100%  LMP 12/05/2012  Physical Exam  Nursing note and vitals reviewed. Constitutional: She is oriented to person, place, and time. She appears well-developed and well-nourished.  Non-toxic appearance. No distress.  HENT:  Head: Normocephalic and atraumatic.  Eyes: Conjunctivae, EOM and lids are normal. Pupils are equal, round, and reactive to light.  Neck: Normal range of motion. Neck supple. No tracheal deviation present. No mass present.  Cardiovascular: Normal rate, regular rhythm and normal heart sounds.  Exam reveals no gallop.   No murmur heard. Pulmonary/Chest: Effort normal and breath sounds normal. No stridor. No respiratory distress. She has no decreased breath sounds. She has no wheezes. She has no rhonchi. She has no rales.  Abdominal: Soft. Normal appearance and bowel sounds are normal. She exhibits no distension. There is no tenderness. There is no rebound and no CVA tenderness.  Musculoskeletal: Normal range of motion. She exhibits no edema and no tenderness.       Feet:  Neurological: She is alert and oriented to person, place, and time. She has normal strength. No cranial nerve deficit or sensory deficit. GCS eye subscore is 4. GCS verbal subscore is 5. GCS motor subscore is 6.  Skin: Skin is warm and dry. No abrasion and no rash noted.  Psychiatric: She has a normal mood and affect. Her speech is normal and behavior is normal.    ED Course    Procedures (including critical care time)  Labs Reviewed - No data to display Dg Ankle Complete Right  01/08/2013   *RADIOLOGY REPORT*  Clinical Data:  Ankle swelling and pain.  No known injury.  RIGHT ANKLE - COMPLETE 3+ VIEW  Comparison:  None.  Findings:  There is no evidence of fracture, dislocation, or joint effusion.  There is no evidence of arthropathy or other focal bone abnormality. No evidence of ankle joint effusion.  Soft tissues are unremarkable.  IMPRESSION: Negative.   Original Report Authenticated By: Myles Rosenthal, M.D.     No diagnosis found.    MDM  Pt given splint for suspected ankle sprain, albuterol mdi refilled, stable for d/c        Toy Baker, MD 01/08/13 1043

## 2013-02-02 ENCOUNTER — Encounter: Payer: Medicare Other | Admitting: Family Medicine

## 2013-04-25 ENCOUNTER — Ambulatory Visit: Payer: Medicare Other | Admitting: Family Medicine

## 2013-10-05 ENCOUNTER — Ambulatory Visit (INDEPENDENT_AMBULATORY_CARE_PROVIDER_SITE_OTHER): Payer: Medicare Other | Admitting: Family Medicine

## 2013-10-05 ENCOUNTER — Encounter: Payer: Self-pay | Admitting: Family Medicine

## 2013-10-05 VITALS — BP 158/104 | HR 96 | Temp 98.2°F | Ht <= 58 in | Wt 171.5 lb

## 2013-10-05 DIAGNOSIS — IMO0001 Reserved for inherently not codable concepts without codable children: Secondary | ICD-10-CM | POA: Diagnosis not present

## 2013-10-05 DIAGNOSIS — M791 Myalgia, unspecified site: Secondary | ICD-10-CM | POA: Insufficient documentation

## 2013-10-05 LAB — COMPREHENSIVE METABOLIC PANEL
ALT: 24 U/L (ref 0–35)
AST: 32 U/L (ref 0–37)
Albumin: 4.1 g/dL (ref 3.5–5.2)
Alkaline Phosphatase: 75 U/L (ref 39–117)
BILIRUBIN TOTAL: 0.4 mg/dL (ref 0.2–1.2)
BUN: 8 mg/dL (ref 6–23)
CO2: 25 meq/L (ref 19–32)
CREATININE: 0.65 mg/dL (ref 0.50–1.10)
Calcium: 9.5 mg/dL (ref 8.4–10.5)
Chloride: 102 mEq/L (ref 96–112)
Glucose, Bld: 103 mg/dL — ABNORMAL HIGH (ref 70–99)
Potassium: 4 mEq/L (ref 3.5–5.3)
Sodium: 137 mEq/L (ref 135–145)
Total Protein: 7.2 g/dL (ref 6.0–8.3)

## 2013-10-05 LAB — CBC
HEMATOCRIT: 42.4 % (ref 36.0–46.0)
HEMOGLOBIN: 14.3 g/dL (ref 12.0–15.0)
MCH: 30.2 pg (ref 26.0–34.0)
MCHC: 33.7 g/dL (ref 30.0–36.0)
MCV: 89.6 fL (ref 78.0–100.0)
Platelets: 304 10*3/uL (ref 150–400)
RBC: 4.73 MIL/uL (ref 3.87–5.11)
RDW: 13.3 % (ref 11.5–15.5)
WBC: 4.4 10*3/uL (ref 4.0–10.5)

## 2013-10-05 LAB — CK: Total CK: 97 U/L (ref 7–177)

## 2013-10-05 LAB — TSH: TSH: 2.72 u[IU]/mL (ref 0.350–4.500)

## 2013-10-05 NOTE — Progress Notes (Signed)
   Subjective:    Patient ID: [name] [name], female    DOB: 1976/03/18, 38 y.o.   MRN: [id number]  HPI  Muscle Pain All over especially in back and extremities started 3 days ago.  Assocaited with chills and fatigue.  No fever or nausea or vomiting or diarrhea or rash.  No new medications or exposures.   Aleve helped some.  Has been doing more walking lately due to car repair.   LMP 2 days ago  Review of Symptoms - see HPI  PMH - Smoking status noted.     Review of Systems     Objective:   Physical Exam Alert mildy ill Neck:  No deformities, thyromegaly, masses, or tenderness noted.   Supple with full range of motion without pain. Lungs:  Normal respiratory effort, chest expands symmetrically. Lungs are clear to auscultation, no crackles or wheezes. Heart - Regular rate and rhythm.  No murmurs, gallops or rubs.    Abdomen: soft and non-tender without masses, organomegaly or hernias noted.  No guarding or rebound Psych:  Cognition and judgment appear intact. Alert, communicative  and cooperative with normal attention span and concentration. No apparent delusions, illusions, hallucinations Able to get on exam table but slowly due to CP and pain.  All major muscle groups tender.  Grip and heel strength normal  Finger to Nose EOM intact Cn 2-7 normal        Assessment & Plan:

## 2013-10-05 NOTE — Patient Instructions (Signed)
Good to see you today!  Thanks for coming in.  I will call you if your lab tests are not normal.  Otherwise we will discuss them at your next visit.  Come back next week - Make an appointment at the front desk  Take aleve twice daily for aches and continue to be active but no heavy lifting  Call us if you are feeling a lot worse

## 2013-10-05 NOTE — Assessment & Plan Note (Addendum)
Acute onset.  May be due to viral or over use. No definite signs of inflammatory disease or acute bacterial infection.  Given severity will check labs and follow up soon.    Will follow up on PHQ 9 at that time also

## 2013-10-06 ENCOUNTER — Telehealth: Payer: Self-pay | Admitting: Family Medicine

## 2013-10-06 NOTE — Telephone Encounter (Signed)
Pt called and wanted to know does she have to wait until her next visit to get that or can he call it in? Work [phone]

## 2013-10-06 NOTE — Telephone Encounter (Signed)
Should wait till she comes in.  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

## 2013-10-06 NOTE — Telephone Encounter (Signed)
Pt referring to BP medications. [name] [name], [name] [name]

## 2013-10-06 NOTE — Telephone Encounter (Signed)
Pt informed. [name] [name]  

## 2013-10-12 ENCOUNTER — Encounter: Payer: Self-pay | Admitting: Family Medicine

## 2013-10-12 ENCOUNTER — Ambulatory Visit (INDEPENDENT_AMBULATORY_CARE_PROVIDER_SITE_OTHER): Payer: Medicare Other | Admitting: Family Medicine

## 2013-10-12 VITALS — BP 155/117 | HR 78 | Temp 98.5°F | Ht <= 58 in | Wt 170.0 lb

## 2013-10-12 DIAGNOSIS — Z309 Encounter for contraceptive management, unspecified: Secondary | ICD-10-CM | POA: Insufficient documentation

## 2013-10-12 DIAGNOSIS — F3289 Other specified depressive episodes: Secondary | ICD-10-CM

## 2013-10-12 DIAGNOSIS — F329 Major depressive disorder, single episode, unspecified: Secondary | ICD-10-CM | POA: Diagnosis not present

## 2013-10-12 DIAGNOSIS — I10 Essential (primary) hypertension: Secondary | ICD-10-CM

## 2013-10-12 DIAGNOSIS — F32A Depression, unspecified: Secondary | ICD-10-CM

## 2013-10-12 LAB — POCT URINE PREGNANCY: PREG TEST UR: NEGATIVE

## 2013-10-12 MED ORDER — SERTRALINE HCL 50 MG PO TABS: 50.0000 mg | ORAL_TABLET | Freq: Every day | ORAL | Status: DC

## 2013-10-12 MED ORDER — MEDROXYPROGESTERONE ACETATE 150 MG/ML IM SUSP
150.0000 mg | Freq: Once | INTRAMUSCULAR | Status: AC
  Administered 2013-10-12: 150 mg via INTRAMUSCULAR

## 2013-10-12 MED ORDER — AMLODIPINE BESYLATE 10 MG PO TABS: 10.0000 mg | ORAL_TABLET | Freq: Every day | ORAL | Status: DC

## 2013-10-12 NOTE — Assessment & Plan Note (Addendum)
First time diagnosed and treated with meds but sounds like has had symptoms in past.  Lots of life stressors cloud issue but since seems to be worsening and persistent over several visits with high PHQ9 scores will start SSRI once stable on Depo and amlodipine.   Suggested counselor but her responsibilities make attending very challenging.  Discussed her etoh use and she agrees to cut down.  Will need to follow up on this next visit

## 2013-10-12 NOTE — Patient Instructions (Signed)
Good to see you today!  Thanks for coming in.  High Blood pressure - take one Amlodipine each day.  Try to record your blood pressures several times a week and bring the readings in your next visit  Depression - I would consider seeing a counselor.  Talk with friends and see if you can find one you can see.   I gave you a prescription for Zoloft 50 mg once a day.  I would start that in one week once you are used to your Depo and bp medication If you start to feel worse call [place].  Birth Control - take the depo shot every 3 months  Come back to see one of my partners in 2-3 weeks  [facility] Team Provider

## 2013-10-12 NOTE — Assessment & Plan Note (Signed)
Start Depo given her hypertension and stressful life style

## 2013-10-12 NOTE — Assessment & Plan Note (Signed)
Essential with recent normal labs.  Will start CCB and monitor

## 2013-10-12 NOTE — Progress Notes (Signed)
   Subjective:    Patient ID: [name] [name], female    DOB: 1976-06-30, 38 y.o.   MRN: [id number]  HPI  Muscle Pains Much improved.  Depression Has been feeling down with problems sleeping for weeks- months.  Feels very stressed with her child responsbilities and recent car trouble.  Feels like crying often but cant.  Recently frequent EMA and cannt get back to sleep.  Little energy and tired all the time.  Doesn't do anything for fun.   No SI - my family never has that.   Has seen counselors in past but never treated with antidepressant.  No Mania symptoms in past.  Drinks 2 glasses of wine per night.  Etoh has been a problem - "drank too much" several years ago but she does not feel is now.   Agrees should be able to cut down to 1 glass without problems.  No other drug use   HYPERTENSION Disease Monitoring Home BP Monitoring not checking but has been high on off for many visits Chest pain- no    Dyspnea- no excpet when asthma is botehing her Medications Compliance-  Never taken meds . Lightheadedness-  no  Edema- no except for R ankle chronic strains  ROS - See HPI  PMH Lab Review   Potassium  Date Value Ref Range Status  10/05/2013 4.0  3.5 - 5.3 mEq/L Final     Sodium  Date Value Ref Range Status  10/05/2013 137  135 - 145 mEq/L Final     Creat  Date Value Ref Range Status  10/05/2013 0.65  0.50 - 1.10 mg/dL Final       Review of Symptoms - see HPI  PMH - Smoking status noted.       Review of Systems     Objective:   Physical Exam  Alert nad Heart - Regular rate and rhythm.  No murmurs, gallops or rubs.    Lungs:  Normal respiratory effort, chest expands symmetrically. Lungs are clear to auscultation, no crackles or wheezes. Extrem - mild sts and tenderness over R lateral ankle.  Heel cords tight due to CP.  No pedal edema Psych:  Cognition and judgment appear intact. Alert, communicative  and cooperative with normal attention span and concentration. No  apparent delusions, illusions, hallucinations        Assessment & Plan:

## 2013-11-01 ENCOUNTER — Ambulatory Visit: Payer: Medicare Other | Admitting: Family Medicine

## 2013-11-25 ENCOUNTER — Telehealth: Payer: Self-pay | Admitting: Family Medicine

## 2013-11-25 MED ORDER — ALBUTEROL SULFATE HFA 108 (90 BASE) MCG/ACT IN AERS: 2.0000 | INHALATION_SPRAY | Freq: Four times a day (QID) | RESPIRATORY_TRACT | Status: DC | PRN

## 2013-11-25 NOTE — Telephone Encounter (Signed)
I sent in an Rx for albuterol  Please let her know if she is quite short of breath she should be seen today at [facility] or [facility] Thanks

## 2013-11-25 NOTE — Telephone Encounter (Signed)
Left message on voicemail informing of message from MD. 

## 2013-11-25 NOTE — Telephone Encounter (Signed)
Pt called and needs a refill on her albuterol called in. She didn't need it in March but she is having trouble breathing and does need it now. Please call in ASAP. jw

## 2014-01-09 ENCOUNTER — Ambulatory Visit (INDEPENDENT_AMBULATORY_CARE_PROVIDER_SITE_OTHER): Payer: Medicare Other | Admitting: *Deleted

## 2014-01-09 DIAGNOSIS — Z3049 Encounter for surveillance of other contraceptives: Secondary | ICD-10-CM

## 2014-01-09 DIAGNOSIS — Z3042 Encounter for surveillance of injectable contraceptive: Secondary | ICD-10-CM

## 2014-01-09 MED ORDER — MEDROXYPROGESTERONE ACETATE 150 MG/ML IM SUSP
150.0000 mg | Freq: Once | INTRAMUSCULAR | Status: AC
  Administered 2014-01-09: 150 mg via INTRAMUSCULAR

## 2014-01-09 NOTE — Progress Notes (Signed)
   Pt in for Depo Provera injection.  Pt tolerated Depo injection. Depo given Right upper outer quadrant.  Next injection due August 31 - April 10, 2014.  Reminder card given. Derl Barrow, RN

## 2014-02-06 ENCOUNTER — Emergency Department (HOSPITAL_COMMUNITY)
Admission: EM | Admit: 2014-02-06 | Discharge: 2014-02-06 | Discharge status: Absent without leave | Payer: Medicare Other | Source: Home / Self Care

## 2014-02-06 NOTE — ED Notes (Signed)
Pt called in all waiting areas - no response from this pt.

## 2014-02-06 NOTE — ED Notes (Signed)
Called x 2 by York Cerise, CMA w/ no response from this pt.

## 2014-02-07 ENCOUNTER — Encounter: Payer: Self-pay | Admitting: Family Medicine

## 2014-02-07 ENCOUNTER — Ambulatory Visit (INDEPENDENT_AMBULATORY_CARE_PROVIDER_SITE_OTHER): Payer: Medicare Other | Admitting: Family Medicine

## 2014-02-07 ENCOUNTER — Ambulatory Visit (HOSPITAL_COMMUNITY)
Admission: RE | Admit: 2014-02-07 | Discharge: 2014-02-07 | Disposition: A | Discharge status: Home / Self Care | Payer: Medicare Other | Source: Ambulatory Visit | Attending: Family Medicine | Admitting: Family Medicine

## 2014-02-07 ENCOUNTER — Telehealth: Payer: Self-pay | Admitting: Family Medicine

## 2014-02-07 VITALS — BP 162/102 | HR 112 | Temp 98.8°F | Ht <= 58 in | Wt 165.5 lb

## 2014-02-07 DIAGNOSIS — J069 Acute upper respiratory infection, unspecified: Secondary | ICD-10-CM | POA: Insufficient documentation

## 2014-02-07 DIAGNOSIS — I10 Essential (primary) hypertension: Secondary | ICD-10-CM

## 2014-02-07 LAB — CBC WITH DIFFERENTIAL/PLATELET
Basophils Absolute: 0 10*3/uL (ref 0.0–0.1)
Basophils Relative: 0 % (ref 0–1)
Eosinophils Absolute: 0.2 10*3/uL (ref 0.0–0.7)
Eosinophils Relative: 4 % (ref 0–5)
HEMATOCRIT: 45.2 % (ref 36.0–46.0)
HEMOGLOBIN: 15.8 g/dL — AB (ref 12.0–15.0)
LYMPHS ABS: 1.7 10*3/uL (ref 0.7–4.0)
LYMPHS PCT: 29 % (ref 12–46)
MCH: 31.8 pg (ref 26.0–34.0)
MCHC: 35 g/dL (ref 30.0–36.0)
MCV: 90.9 fL (ref 78.0–100.0)
MONO ABS: 0.8 10*3/uL (ref 0.1–1.0)
MONOS PCT: 14 % — AB (ref 3–12)
NEUTROS ABS: 3 10*3/uL (ref 1.7–7.7)
NEUTROS PCT: 53 % (ref 43–77)
Platelets: 318 10*3/uL (ref 150–400)
RBC: 4.97 MIL/uL (ref 3.87–5.11)
RDW: 14.5 % (ref 11.5–15.5)
WBC: 5.7 10*3/uL (ref 4.0–10.5)

## 2014-02-07 MED ORDER — ALBUTEROL SULFATE (2.5 MG/3ML) 0.083% IN NEBU
2.5000 mg | INHALATION_SOLUTION | Freq: Once | RESPIRATORY_TRACT | Status: AC
  Administered 2014-02-07: 2.5 mg via RESPIRATORY_TRACT

## 2014-02-07 MED ORDER — PREDNISONE 50 MG PO TABS: ORAL_TABLET | ORAL | Status: DC

## 2014-02-07 MED ORDER — IPRATROPIUM BROMIDE 0.02 % IN SOLN
0.5000 mg | Freq: Once | RESPIRATORY_TRACT | Status: AC
  Administered 2014-02-07: 0.5 mg via RESPIRATORY_TRACT

## 2014-02-07 NOTE — Assessment & Plan Note (Signed)
Patient with upper respiratory infection today versus asthma exacerbation versus pneumonia. Patient was given a breathing treatment in clinic today, with good improvement in air movement. Still with some diffuse wheezing, patient encouraged to use an albuterol inhaler at home every 4 hours for the next 48 hours. CBC and CMP today. Muscle cramps are likely due to electrolyte disturbance from illness. Chest x-ray today. Will call patient with results tomorrow and possible antibiotic prescription if needed. Steroid burst given for 5 days.

## 2014-02-07 NOTE — Telephone Encounter (Signed)
Please call [name] [name] and inform her that her CXR is clear. She does not abx. Thanks.

## 2014-02-07 NOTE — Patient Instructions (Addendum)
Upper Respiratory Infection, Adult An upper respiratory infection (URI) is also known as the common cold. It is often caused by a type of germ (virus). Colds are easily spread (contagious). You can pass it to others by kissing, coughing, sneezing, or drinking out of the same glass. Usually, you get better in 1 or 2 weeks.  HOME CARE   Only take medicine as told by your doctor.  Use a warm mist humidifier or breathe in steam from a hot shower.  Drink enough water and fluids to keep your pee (urine) clear or pale yellow.  Get plenty of rest.  Return to work when your temperature is back to normal or as told by your doctor. You may use a face mask and wash your hands to stop your cold from spreading. GET HELP RIGHT AWAY IF:   After the first few days, you feel you are getting worse.  You have questions about your medicine.  You have chills, shortness of breath, or brown or red spit (mucus).  You have yellow or brown snot (nasal discharge) or pain in the face, especially when you bend forward.  You have a fever, puffy (swollen) neck, pain when you swallow, or white spots in the back of your throat.  You have a bad headache, ear pain, sinus pain, or chest pain.  You have a high-pitched whistling sound when you breathe in and out (wheezing).  You have a lasting cough or cough up blood.  You have sore muscles or a stiff neck. MAKE SURE YOU:   Understand these instructions.  Will watch your condition.  Will get help right away if you are not doing well or get worse. Document Released: 12/31/2007 Document Revised: 10/06/2011 Document Reviewed: 11/18/2010 [facility] Patient Information 2015 [place], [place]. This information is not intended to replace advice given to you by your health care provider. Make sure you discuss any questions you have with your health care provider.  Reading Food Labels Foods that are packaged or in containers have a Nutrition Facts panel on the side or back.  This is commonly called the food label. The food label helps you make healthy food choices by providing information about serving size and the amount of calories and various nutrients in the food. You can check the food label to find out if the food contains high or low amounts of nutrients that you want to limit in your diet. You can also use the food label to see if the food is a good source of the nutrients that you want to make sure are included in your diet. HOW DO I READ THE FOOD LABEL?  Begin by checking the serving size and number of servings in the container. All of the nutrition information listed on the food label is based on one serving. If you eat more than one serving, you must multiply the amounts (such as calories, grams of saturated fat, or milligrams of sodium) by the number of servings.  Check the calories. Choosing foods that are low in calories can help you manage your weight.  Look at the numbers in the % Daily Value column for each listed nutrient. This gives you an idea of how much of the daily recommended amount for that nutrient is provided in one serving of the food. A daily value of 5% or less is considered low. A daily value of 20% or more is considered high.  Check the amounts for the items you should limit in your diet. These include:  Total fat.  Saturated fat.  Trans fat.  Cholesterol.  Sodium.  Check the amounts for the items you should make sure you get enough of. These include:  Dietary fiber.  Vitamins A and C.  Calcium.  Iron. WHAT INFORMATION IS PROVIDED ON THE FOOD LABEL? Serving Information  Serving size.  The serving size is listed in cups or pieces. The nutrient amounts listed on the food label apply to this amount of the food.  Servings per container or package.  This shows the number of servings you can expect to get from the container or package if you follow the suggested serving size. Amount Per Serving  Calories.  The number  of calories in one serving of the food. This information is helpful in managing weight. Low-calorie foods contain 40 calories or less. High-calorie foods contain 400 or more calories.  Calories from fat.  The number of calories that come from fat in one serving. Percent Daily Value Percent Daily Value (shown on the label as % Daily Value) tells you what percent of the daily value for each nutrient one serving provides. The daily value is the recommended amount of the nutrient that you should get each day. For example, if 15% is listed next to dietary fiber, it means that one serving of the food will give you 15% of the recommended amount of fiber that you should get in a day. The daily values are based on a 2,000-calorie-per-day diet. You may get more or less than 2,000 calories in your diet each day, but the % Daily Value gives you an idea of whether the food contains a high or low amount of the listed nutrient. A daily value of 5% or less is low. A daily value of 20% or more is high. Total Fat Total fat shows you the total amount of fat in one serving (listed in grams). Foods with high amounts of fat usually have higher calories and may lead to weight gain. Two of the fats that make up a portion of the total fat are included on the label:  Saturated fat.  This number is the amount of saturated fat in one serving (listed in grams). Saturated fat increases the amount of blood cholesterol and should be limited to less than 7% of total calories each day. This means that if you eat 2,000 calories each day, you should eat less than 140 calories from saturated fat.   Trans fat.  This number is the amount of trans fat in one serving (listed in grams). Trans fat is the most unhealthy fat for heart health and should be limited to as little as possible (less than 2 grams per day). Cholesterol The amount of cholesterol in one serving is listed in milligrams. Cholesterol should be limited to no more than 300  mg each day. Sodium The amount of sodium in one serving is listed in milligrams. Most people should limit their sodium intake to 2,300 mg a day. Total Carbohydrate This number shows the amount of total carbohydrate in one serving (listed in grams). This information can help people with diabetes manage the amount of carbohydrate they eat. Two of the carbohydrates that make up a portion of the total carbohydrate are included on the label:  Dietary fiber.  The amount of dietary fiber in one serving is listed in grams. Most people should eat at least 25 g of dietary fiber each day.   Sugars.  The amount of sugar in one serving is also listed  in grams. This value includes both naturally occurring sugars from fruit and milk and added sugars such as honey or table sugar. Protein The amount of protein in one serving is listed in grams.  Vitamins and Minerals The % Daily Value is listed for vitamin A, vitamin C, calcium, and iron. Some food labels also list % Daily Values for some other vitamins or minerals.  WHAT OTHER IMPORTANT LABELING IS ON THE FOOD PACKAGE? Ingredients  Food labels will list each ingredient in the food. The first ingredient listed is the ingredient that the food has the most of. The ingredients are listed in the order of their amount by weight from highest to [place] labels may also include a food allergen warning. Listed here are ingredients that can cause allergic reactions in some people. The potential allergens are listed behind the word "Contains" or "May contain." Examples of ingredients that may be listed are wheat, dairy, eggs, soy, and nuts. If a person knows that he or she is allergic to one of these ingredients, he or she will know to avoid that food. WHERE CAN I FIND MORE INFORMATION? U.S. Food and Drug Administration: [employer] Document Released: 07/14/2005 Document Revised: 07/19/2013 Document Reviewed: 06/06/2013 [facility] Patient  Information 2015 [place], [place]. This information is not intended to replace advice given to you by your health care provider. Make sure you discuss any questions you have with your health care provider.  Low-Sodium Eating Plan Sodium raises blood pressure and causes water to be held in the body. Getting less sodium from food will help lower your blood pressure, reduce any swelling, and protect your heart, liver, and kidneys. We get sodium by adding salt (sodium chloride) to food. Most of our sodium comes from canned, boxed, and frozen foods. Restaurant foods, fast foods, and pizza are also very high in sodium. Even if you take medicine to lower your blood pressure or to reduce fluid in your body, getting less sodium from your food is important. WHAT IS MY PLAN? Most people should limit their sodium intake to 2,300 mg a day. Your health care provider recommends that you limit your sodium intake to __________ a day.  WHAT DO I NEED TO KNOW ABOUT THIS EATING PLAN? For the low-sodium eating plan, you will follow these general guidelines:  Choose foods with a % Daily Value for sodium of less than 5% (as listed on the food label).   Use salt-free seasonings or herbs instead of table salt or sea salt.   Check with your health care provider or pharmacist before using salt substitutes.   Eat fresh foods.  Eat more vegetables and fruits.  Limit canned vegetables. If you do use them, rinse them well to decrease the sodium.   Limit cheese to 1 oz (28 g) per day.   Eat lower-sodium products, often labeled as "lower sodium" or "no salt added."  Avoid foods that contain monosodium glutamate (MSG). MSG is sometimes added to [place] food and some canned foods.  Check food labels (Nutrition Facts labels) on foods to learn how much sodium is in one serving.  Eat more home-cooked food and less restaurant, buffet, and fast food.  When eating at a restaurant, ask that your food be prepared with  less salt or none, if possible.  HOW DO I READ FOOD LABELS FOR SODIUM INFORMATION? The Nutrition Facts label lists the amount of sodium in one serving of the food. If you eat more than one serving, you must  multiply the listed amount of sodium by the number of servings. Food labels may also identify foods as:  Sodium free--Less than 5 mg in a serving.  Very low sodium--35 mg or less in a serving.  Low sodium--140 mg or less in a serving.  Light in sodium--50% less sodium in a serving. For example, if a food that usually has 300 mg of sodium is changed to become light in sodium, it will have 150 mg of sodium.  Reduced sodium--25% less sodium in a serving. For example, if a food that usually has 400 mg of sodium is changed to reduced sodium, it will have 300 mg of sodium. WHAT FOODS CAN I EAT? Grains Low-sodium cereals, including oats, puffed wheat and rice, and shredded wheat cereals. Low-sodium crackers. Unsalted rice and pasta. Lower-sodium bread.  Vegetables Frozen or fresh vegetables. Low-sodium or reduced-sodium canned vegetables. Low-sodium or reduced-sodium tomato sauce and paste. Low-sodium or reduced-sodium tomato and vegetable juices.  Fruits Fresh, frozen, and canned fruit. Fruit juice.  Meat and Other Protein Products Low-sodium canned tuna and salmon. Fresh or frozen meat, poultry, seafood, and fish. Lamb. Unsalted nuts. Dried beans, peas, and lentils without added salt. Unsalted canned beans. Homemade soups without salt. Eggs.  Dairy Milk. Soy milk. Ricotta cheese. Low-sodium or reduced-sodium cheeses. Yogurt.  Condiments Fresh and dried herbs and spices. Salt-free seasonings. Onion and garlic powders. Low-sodium varieties of mustard and ketchup. Lemon juice.  Fats and Oils Reduced-sodium salad dressings. Unsalted butter.  Other Unsalted popcorn and pretzels.  The items listed above may not be a complete list of recommended foods or beverages. Contact your  dietitian for more options. WHAT FOODS ARE NOT RECOMMENDED? Grains Instant hot cereals. Bread stuffing, pancake, and biscuit mixes. Croutons. Seasoned rice or pasta mixes. Noodle soup cups. Boxed or frozen macaroni and cheese. Self-rising flour. Regular salted crackers. Vegetables Regular canned vegetables. Regular canned tomato sauce and paste. Regular tomato and vegetable juices. Frozen vegetables in sauces. Salted french fries. Olives. [name] [name]. Relishes. Sauerkraut. Salsa. Meat and Other Protein Products Salted, canned, smoked, spiced, or pickled meats, seafood, or fish. Bacon, ham, sausage, hot dogs, corned beef, chipped beef, and packaged luncheon meats. Salt pork. Jerky. Pickled herring. Anchovies, regular canned tuna, and sardines. Salted nuts. Dairy Processed cheese and cheese spreads. Cheese curds. Blue cheese and cottage cheese. Buttermilk.  Condiments Onion and garlic salt, seasoned salt, table salt, and sea salt. Canned and packaged gravies. Worcestershire sauce. Tartar sauce. Barbecue sauce. Teriyaki sauce. Soy sauce, including reduced sodium. Steak sauce. Fish sauce. Oyster sauce. Cocktail sauce. Horseradish. Regular ketchup and mustard. Meat flavorings and tenderizers. Bouillon cubes. Hot sauce. Tabasco sauce. Marinades. Taco seasonings. Relishes. Fats and Oils Regular salad dressings. Salted butter. Margarine. Ghee. Bacon fat.  Other Potato and tortilla chips. Corn chips and puffs. Salted popcorn and pretzels. Canned or dried soups. Pizza. Frozen entrees and pot pies.  The items listed above may not be a complete list of foods and beverages to avoid. Contact your dietitian for more information. Document Released: 01/03/2002 Document Revised: 07/19/2013 Document Reviewed: 05/18/2013 [facility] Patient Information 2015 [place], [place]. This information is not intended to replace advice given to you by your health care provider. Make sure you discuss any questions you have with  your health care provider.   I will want to See you back with other myself or Dr. Erin Hearing 1 week. I have given her prescription for steroids today to be taken for 5 days. Please go to have chest x-ray completed today.  I will call you if I feel you need to be started on antibiotic. I will call you with the lab results from today as well. Take your albuterol inhaler every 4-6 hours for the next 2 days to help with breathing. Take Tylenol if you develop any fevers. Make certain to drink plenty of water, approximately 60 ounces or more a day. He will likely need additional medications for your blood pressure if it does not improve It is a pleasure seeing you today and I hope you feel better.

## 2014-02-07 NOTE — Progress Notes (Signed)
   Subjective:    Patient ID: [name] [name], female    DOB: [birth year], 38 y.o.   MRN: [id number]  HPI [name] [name] is a ,38 y.o. African American female presents to family medicine clinic today for same-day appointment.  Dizziness/ night sweats: Patient states that last week she started not feeling well with some dizziness and "tremors ". At that time she has also been noticing some night sweats and chills. She states 3 days ago on Sunday the dizziness became worse. Her body started aching on Monday. In addition she endorses nausea, diarrhea and decreased appetite. She states she is making good urine output attempting to drink plenty of fluids. She denies dysuria. She also endorses nasal congestion and muscle cramps. She has a history of asthma and has been needing to use her albuterol inhaler at least a few times a day, or prior she rarely had to use her inhaler. Hypertension: Of note patient has been recently started on amlodipine for her hypertension. Of which she could not followup because she did not have a ride to the clinic. Patient states she has been taking her medication every day. She has not had any dizziness or notable side effects of medication.  Review of Systems Per history of present illness    Objective:   Physical Exam BP 162/102  Pulse 112  Temp(Src) 98.8 F (37.1 C) (Oral)  Ht 4\' 9"  (1.448 m)  Wt 165 lb 8 oz (75.07 kg)  BMI 35.80 kg/m2 Gen: Pleasant African American female. Walking with a cane due to cerebral palsy. No acute distress and nontoxic in appearance. Well-developed and well-nourished. HEENT: AT. [place]. Bilateral TM visualized and normal in appearance. Right high with mild injections, no icterus. Mildly dry mucous membranes.. Bilateral nares with paleness and swelling. Throat with mild erythema, no exudates.  CV: Tachycardic. No murmur appreciated. Chest: Decreased air movement bilaterally. Mild diffuse wheezes bilaterally. Increased work of breath with  ambulation to the table. Abd: Soft. NTND. BS present. No Masses palpated.

## 2014-02-07 NOTE — Assessment & Plan Note (Signed)
Patient not controlled today. Uncertain if blood pressure elevation is from acute illness today. Discussed with patient her blood pressure in the possibility of needing a second agent for control. She is to followup with in one to 2 weeks.

## 2014-02-08 ENCOUNTER — Telehealth: Payer: Self-pay | Admitting: Family Medicine

## 2014-02-08 LAB — BASIC METABOLIC PANEL
BUN: 7 mg/dL (ref 6–23)
CHLORIDE: 101 meq/L (ref 96–112)
CO2: 22 mEq/L (ref 19–32)
Calcium: 9.9 mg/dL (ref 8.4–10.5)
Creat: 0.71 mg/dL (ref 0.50–1.10)
GLUCOSE: 101 mg/dL — AB (ref 70–99)
POTASSIUM: 3.6 meq/L (ref 3.5–5.3)
SODIUM: 136 meq/L (ref 135–145)

## 2014-02-08 NOTE — Telephone Encounter (Signed)
Please call [name] [name] and inform her that her lab work was also good. No signs of infection or electrolyte imbalance. Tell her to continue the steroids and inhaler and follow up in 1 week to see how she is recovering and discuss her hypertension. Thanks.

## 2014-02-14 ENCOUNTER — Ambulatory Visit: Payer: Medicare Other | Admitting: Family Medicine

## 2014-02-27 ENCOUNTER — Ambulatory Visit: Payer: Medicare Other | Admitting: Family Medicine

## 2014-05-29 ENCOUNTER — Encounter: Payer: Self-pay | Admitting: Family Medicine

## 2014-08-03 ENCOUNTER — Telehealth: Payer: Self-pay | Admitting: Family Medicine

## 2014-08-03 MED ORDER — ALBUTEROL SULFATE HFA 108 (90 BASE) MCG/ACT IN AERS: 2.0000 | INHALATION_SPRAY | Freq: Four times a day (QID) | RESPIRATORY_TRACT | Status: DC | PRN

## 2014-08-03 NOTE — Telephone Encounter (Signed)
Pt called because her insurance is no longer covering her ProAir. She needs something else called in that is the same but that Medicare will cover. jw

## 2014-08-03 NOTE — Telephone Encounter (Signed)
Will forward to MD.  Pt has medicare and medicaid. Laith Antonelli,CMA

## 2014-08-03 NOTE — Telephone Encounter (Signed)
Pt is aware and appt made. Jazmin Hartsell,CMA

## 2014-08-03 NOTE — Telephone Encounter (Signed)
I sent in a generic for Ventolin which hopefully they will cover.  Please let her know that she should call us if they need a specific brand.  She needs an office visit to follow up on her asthma and hypertension Thanks LC

## 2014-08-16 ENCOUNTER — Ambulatory Visit (INDEPENDENT_AMBULATORY_CARE_PROVIDER_SITE_OTHER): Payer: Medicare Other | Admitting: Family Medicine

## 2014-08-16 ENCOUNTER — Encounter: Payer: Self-pay | Admitting: Family Medicine

## 2014-08-16 VITALS — BP 124/86 | HR 92 | Temp 98.5°F | Ht <= 58 in | Wt 174.0 lb

## 2014-08-16 DIAGNOSIS — F329 Major depressive disorder, single episode, unspecified: Secondary | ICD-10-CM | POA: Diagnosis not present

## 2014-08-16 DIAGNOSIS — Z30013 Encounter for initial prescription of injectable contraceptive: Secondary | ICD-10-CM

## 2014-08-16 DIAGNOSIS — I10 Essential (primary) hypertension: Secondary | ICD-10-CM

## 2014-08-16 DIAGNOSIS — Z3009 Encounter for other general counseling and advice on contraception: Secondary | ICD-10-CM | POA: Diagnosis not present

## 2014-08-16 DIAGNOSIS — F32A Depression, unspecified: Secondary | ICD-10-CM

## 2014-08-16 DIAGNOSIS — J4540 Moderate persistent asthma, uncomplicated: Secondary | ICD-10-CM

## 2014-08-16 DIAGNOSIS — Z23 Encounter for immunization: Secondary | ICD-10-CM | POA: Diagnosis not present

## 2014-08-16 LAB — POCT URINE PREGNANCY: Preg Test, Ur: NEGATIVE

## 2014-08-16 MED ORDER — MEDROXYPROGESTERONE ACETATE 150 MG/ML IM SUSP
150.0000 mg | Freq: Once | INTRAMUSCULAR | Status: AC
  Administered 2014-08-16: 150 mg via INTRAMUSCULAR

## 2014-08-16 MED ORDER — FLUTICASONE PROPIONATE HFA 110 MCG/ACT IN AERO: 2.0000 | INHALATION_SPRAY | Freq: Two times a day (BID) | RESPIRATORY_TRACT | Status: DC

## 2014-08-16 NOTE — Patient Instructions (Signed)
Good to see you today!  Thanks for coming in.  Asthma Restart on Flovent 2 puffs twice daily every day Use the albuterol as needed  Hypertension Take the amlodipine every day Try to check your blood pressure once a week or so.  Your goal is < 140/90.  Please write down your readings and bring in  Contraception Will restart your depo   Come back in 1 month for a blood pressure check and pap smear

## 2014-08-16 NOTE — Assessment & Plan Note (Signed)
Not controlled.  Add back controller and follow symptoms

## 2014-08-16 NOTE — Assessment & Plan Note (Signed)
Patient desires restart of depo for contraception.  No contraindications

## 2014-08-16 NOTE — Assessment & Plan Note (Signed)
Seems improved off of medication.  Will follow

## 2014-08-16 NOTE — Assessment & Plan Note (Signed)
Seems controlled by recheck here in office.  Interestingly the machine blood pressure was significantly higher than manual bps

## 2014-08-16 NOTE — Progress Notes (Signed)
   Subjective:    Patient ID: [name] [name], female    DOB: 03-01-1976, 39 y.o.   MRN: [id number]  HPI  Asthma Using inhaler twice daily on average.  Does have some wheezing with cold air and exertion.  No edema.  No sputum or fever  HYPERTENSION Disease Monitoring Home BP Monitoring not chcking Chest pain- no    Dyspnea- no Medications Compliance-  Daily amlodipine. Lightheadedness-  no  Edema- no ROS - See HPI  PMH Lab Review   POTASSIUM  Date Value Ref Range Status  02/07/2014 3.6 3.5 - 5.3 mEq/L Final   SODIUM  Date Value Ref Range Status  02/07/2014 136 135 - 145 mEq/L Final   CREAT  Date Value Ref Range Status  02/07/2014 0.71 0.50 - 1.10 mg/dL Final        Depression Zoloft made her feel strange.  Doing ok from depression standpoint now.  No indications of suicidal ideation   Chief Complaint noted Review of Symptoms - see HPI PMH - Smoking status noted.   Vital Signs reviewed   Review of Systems     Objective:   Physical Exam Alert no acute distress Heart - Regular rate and rhythm.  No murmurs, gallops or rubs.    Lungs:  Normal respiratory effort, chest expands symmetrically. Lungs are clear to auscultation, no crackles or wheezes.  Extrem - no edema  Rechek BP 140/92       Assessment & Plan:

## 2014-09-20 ENCOUNTER — Ambulatory Visit: Payer: Medicare Other | Admitting: Family Medicine

## 2014-09-20 ENCOUNTER — Other Ambulatory Visit: Payer: Medicare Other

## 2014-11-01 ENCOUNTER — Ambulatory Visit: Payer: Medicare Other | Admitting: Family Medicine

## 2014-11-14 ENCOUNTER — Ambulatory Visit (INDEPENDENT_AMBULATORY_CARE_PROVIDER_SITE_OTHER): Payer: Medicare Other | Admitting: Family Medicine

## 2014-11-14 ENCOUNTER — Encounter: Payer: Self-pay | Admitting: Family Medicine

## 2014-11-14 VITALS — BP 144/88 | HR 114 | Temp 98.2°F | Ht <= 58 in | Wt 174.1 lb

## 2014-11-14 DIAGNOSIS — M791 Myalgia, unspecified site: Secondary | ICD-10-CM

## 2014-11-14 DIAGNOSIS — J4540 Moderate persistent asthma, uncomplicated: Secondary | ICD-10-CM | POA: Diagnosis not present

## 2014-11-14 DIAGNOSIS — I10 Essential (primary) hypertension: Secondary | ICD-10-CM

## 2014-11-14 DIAGNOSIS — Z3009 Encounter for other general counseling and advice on contraception: Secondary | ICD-10-CM | POA: Diagnosis present

## 2014-11-14 DIAGNOSIS — Z30013 Encounter for initial prescription of injectable contraceptive: Secondary | ICD-10-CM | POA: Diagnosis not present

## 2014-11-14 LAB — CBC
HEMATOCRIT: 45.5 % (ref 36.0–46.0)
HEMOGLOBIN: 15.5 g/dL — AB (ref 12.0–15.0)
MCH: 31.1 pg (ref 26.0–34.0)
MCHC: 34.1 g/dL (ref 30.0–36.0)
MCV: 91.2 fL (ref 78.0–100.0)
MPV: 10.3 fL (ref 8.6–12.4)
Platelets: 344 10*3/uL (ref 150–400)
RBC: 4.99 MIL/uL (ref 3.87–5.11)
RDW: 14 % (ref 11.5–15.5)
WBC: 5.5 10*3/uL (ref 4.0–10.5)

## 2014-11-14 LAB — BASIC METABOLIC PANEL WITH GFR
BUN: 10 mg/dL (ref 6–23)
CHLORIDE: 105 meq/L (ref 96–112)
CO2: 18 meq/L — AB (ref 19–32)
CREATININE: 0.96 mg/dL (ref 0.50–1.10)
Calcium: 9.1 mg/dL (ref 8.4–10.5)
GFR, EST NON AFRICAN AMERICAN: 76 mL/min
GFR, Est African American: 88 mL/min
GLUCOSE: 100 mg/dL — AB (ref 70–99)
Potassium: 3.6 mEq/L (ref 3.5–5.3)
Sodium: 140 mEq/L (ref 135–145)

## 2014-11-14 LAB — POCT SEDIMENTATION RATE: POCT SED RATE: 6 mm/h (ref 0–22)

## 2014-11-14 MED ORDER — CYCLOBENZAPRINE HCL 10 MG PO TABS: 10.0000 mg | ORAL_TABLET | Freq: Three times a day (TID) | ORAL | Status: DC | PRN

## 2014-11-14 NOTE — Progress Notes (Signed)
   Subjective:    Patient ID: [name] [name], female    DOB: [birth year], 39 y.o.   MRN: [id number]  HPI  Patient presents for Same Day Appointment  CC: arm and leg pain  # Arm and leg pain:  Hx of cerebral palsy, says she has pain all the time but noticed much worse pain this morning in her arms and legs, also low back which is chronic  Pain feels more like a burning.  Recalls feeling the same 1 year ago and being evaluated here at clinic, took ibuprofen and had lab work and the pain resolved in 2-3 days  She also has been having labored breathing, difficulty with talking to son on the phone today.  Has not used any albuterol today for the breathing  Did take 3 tylenol around 12:30pm today and it has helped somewhat  Toward end of visit also states she has been having some dizzy spells like the room is spinning, has to hold on to the walls. She has had dizziness in the past ROS: no fevers, no chills, feels weak all over but no focal weakness, some double vision with the dizziness  Family history in mother of lupus, pt states has been tested for this in the past and nothing was ever positive  Review of Systems   See HPI for ROS. All other systems reviewed and are negative.  Past medical history, surgical, family, and social history reviewed and updated in the EMR as appropriate.  Objective:  BP 144/88 mmHg  Pulse 114  Temp(Src) 98.2 F (36.8 C) (Oral)  Ht 4\' 9"  (1.448 m)  Wt 174 lb 1.6 oz (78.971 kg)  BMI 37.66 kg/m2 Vitals and nursing note reviewed  General: NAD Eyes: PERRL, EOMI CV: Tachycardic but regular rhythm, normal s1s2 no murmurs Resp: mild wheezing bilaterally, no crackles. Normal WOB Ext: no edema or cyanosis. TTP diffusely posterior arms b/l, anterior thighs b/l, posterior shoulders over trap b/l, lateral low back b/l Skin: no rashes Neuro: alert and oriented, no focal deficits noted. No nystagmus noted when pt complained of dizziness  Assessment & Plan:    See Problem List Documentation

## 2014-11-14 NOTE — Patient Instructions (Signed)
We will get some lab work to look for any obvious cause of the pain.  Try the flexeril muscle relaxer, it will make you sleepy so no driving while taking it.  If not feeling better

## 2014-11-15 ENCOUNTER — Other Ambulatory Visit (HOSPITAL_COMMUNITY)
Admission: RE | Admit: 2014-11-15 | Discharge: 2014-11-15 | Disposition: A | Discharge status: Home / Self Care | Payer: Medicare Other | Source: Ambulatory Visit | Attending: Family Medicine | Admitting: Family Medicine

## 2014-11-15 ENCOUNTER — Ambulatory Visit (INDEPENDENT_AMBULATORY_CARE_PROVIDER_SITE_OTHER): Payer: Medicare Other | Admitting: Family Medicine

## 2014-11-15 ENCOUNTER — Encounter: Payer: Self-pay | Admitting: Family Medicine

## 2014-11-15 VITALS — BP 148/82 | HR 62 | Temp 98.3°F | Ht <= 58 in | Wt 172.0 lb

## 2014-11-15 DIAGNOSIS — Z3042 Encounter for surveillance of injectable contraceptive: Secondary | ICD-10-CM | POA: Diagnosis not present

## 2014-11-15 DIAGNOSIS — Z1151 Encounter for screening for human papillomavirus (HPV): Secondary | ICD-10-CM | POA: Insufficient documentation

## 2014-11-15 DIAGNOSIS — Z3009 Encounter for other general counseling and advice on contraception: Secondary | ICD-10-CM | POA: Diagnosis present

## 2014-11-15 DIAGNOSIS — J4540 Moderate persistent asthma, uncomplicated: Secondary | ICD-10-CM

## 2014-11-15 DIAGNOSIS — Z124 Encounter for screening for malignant neoplasm of cervix: Secondary | ICD-10-CM | POA: Diagnosis not present

## 2014-11-15 DIAGNOSIS — Z01419 Encounter for gynecological examination (general) (routine) without abnormal findings: Secondary | ICD-10-CM | POA: Diagnosis not present

## 2014-11-15 DIAGNOSIS — I10 Essential (primary) hypertension: Secondary | ICD-10-CM | POA: Diagnosis not present

## 2014-11-15 DIAGNOSIS — Z30013 Encounter for initial prescription of injectable contraceptive: Secondary | ICD-10-CM | POA: Diagnosis not present

## 2014-11-15 DIAGNOSIS — F329 Major depressive disorder, single episode, unspecified: Secondary | ICD-10-CM | POA: Diagnosis not present

## 2014-11-15 DIAGNOSIS — F32A Depression, unspecified: Secondary | ICD-10-CM

## 2014-11-15 MED ORDER — MEDROXYPROGESTERONE ACETATE 150 MG/ML IM SUSP
150.0000 mg | Freq: Once | INTRAMUSCULAR | Status: AC
  Administered 2014-11-15: 150 mg via INTRAMUSCULAR

## 2014-11-15 NOTE — Progress Notes (Signed)
I was preceptor the day of this visit.   

## 2014-11-15 NOTE — Patient Instructions (Addendum)
Good to see you today!  Thanks for coming in.  Taper off the flexaril - go down to 2 a day then 1 a day.  If the pain is not gone in 2 weeks come back   Monitor your blood pressure -  goal is to be < 140/90  Good work with the asthma - if it begins to bother your more call us

## 2014-11-15 NOTE — Assessment & Plan Note (Signed)
163/112 on initial check, likely elevated due to pain. Re-check better at 144/88, which is still not at goal. Once current symptoms resolve and if BP still elevated will need additional agent.

## 2014-11-15 NOTE — Assessment & Plan Note (Addendum)
Nonspecific and diffuse complaints of pain, consistent with myalgia type pain. No obvious etiology, not on statin, no history of thyroid issue nor complaints of hypothyroid other than fatigue that started today. Has had similar complaints in the past that resolved spontaneously. No red flags on exam, some muscle tenderness that was diffuse. Will check CBC, BMP, ESR. Trial of flexeril (discussed meclizine as well since she mentioned dizziness toward end of exam but pt elects trial of flexeril first). Pt already had appt scheduled with PCP tomorrow.

## 2014-11-15 NOTE — Progress Notes (Signed)
   Subjective:    Patient ID: Rosine Beat, female    DOB: 29-Nov-1975, 39 y.o.   MRN: [id number]  HPI  Asthma Doing well.  No cough and has not used albuterol for weeks.  Using flovent daily.  No mouth rash  HYPERTENSION Disease Monitoring Home BP Monitoring Not checking Chest pain- no    Dyspnea- no Medications Compliance-  Daily norvasc. Lightheadedness-  no  Edema- no ROS - See HPI  PMH Lab Review   POTASSIUM  Date Value Ref Range Status  11/14/2014 3.6 3.5 - 5.3 mEq/L Final   SODIUM  Date Value Ref Range Status  11/14/2014 140 135 - 145 mEq/L Final   CREAT  Date Value Ref Range Status  11/14/2014 0.96 0.50 - 1.10 mg/dL Final       Muscle Pain Seen for this yesteday. Feeling some better.  No fever or change in urine. Taking Flexaril which helps  Chief Complaint noted Review of Symptoms - see HPI PMH - Smoking status noted.   Vital Signs reviewed      Review of Systems     Objective:   Physical Exam Alert no acute distress Heart - Regular rate and rhythm.  No murmurs, gallops or rubs.    Lungs:  Normal respiratory effort, chest expands symmetrically. Lungs are clear to auscultation, no crackles or wheezes. Abdomen: soft and non-tender without masses, organomegaly or hernias noted.  No guarding or rebound Extremities:  No cyanosis, edema, or deformity noted with good range of motion of all major joints.   Genitalia:  Normal introitus for age, no external lesions, no vaginal discharge, mucosa pink and moist, no vaginal or cervical lesions, no vaginal atrophy, no friaility or hemorrhage,         Assessment & Plan:   Gyn - normal exam.  Await pap.  Continue Depo for contraception

## 2014-11-16 LAB — CYTOLOGY - PAP

## 2014-11-16 NOTE — Assessment & Plan Note (Signed)
Well controlled 

## 2014-11-16 NOTE — Assessment & Plan Note (Signed)
Seems well controlled without medications.  She is having some problems with her boyfriend but is handling well. Works for an [employer] and enjoys her work.  Her 37 yo daughter [name] [name] is a "handful" but manageable

## 2014-11-16 NOTE — Assessment & Plan Note (Signed)
BP Readings from Last 3 Encounters:  11/15/14 148/82  11/14/14 144/88  08/16/14 124/86   Not at goal but had not taken her norvasc this am. Asked her to monitor at home

## 2015-01-26 ENCOUNTER — Encounter: Payer: Self-pay | Admitting: Family Medicine

## 2015-01-26 ENCOUNTER — Ambulatory Visit (INDEPENDENT_AMBULATORY_CARE_PROVIDER_SITE_OTHER): Payer: Medicare Other | Admitting: Family Medicine

## 2015-01-26 VITALS — BP 136/96 | HR 102 | Temp 98.3°F | Ht <= 58 in | Wt 178.0 lb

## 2015-01-26 DIAGNOSIS — Z30013 Encounter for initial prescription of injectable contraceptive: Secondary | ICD-10-CM | POA: Diagnosis not present

## 2015-01-26 DIAGNOSIS — Z3009 Encounter for other general counseling and advice on contraception: Secondary | ICD-10-CM | POA: Diagnosis present

## 2015-01-26 DIAGNOSIS — R202 Paresthesia of skin: Secondary | ICD-10-CM

## 2015-01-26 DIAGNOSIS — R200 Anesthesia of skin: Secondary | ICD-10-CM

## 2015-01-26 DIAGNOSIS — M791 Myalgia, unspecified site: Secondary | ICD-10-CM

## 2015-01-26 DIAGNOSIS — R209 Unspecified disturbances of skin sensation: Secondary | ICD-10-CM | POA: Diagnosis not present

## 2015-01-26 DIAGNOSIS — J4540 Moderate persistent asthma, uncomplicated: Secondary | ICD-10-CM | POA: Diagnosis not present

## 2015-01-26 LAB — COMPREHENSIVE METABOLIC PANEL
ALT: 38 U/L — ABNORMAL HIGH (ref 0–35)
AST: 54 U/L — ABNORMAL HIGH (ref 0–37)
Albumin: 3.8 g/dL (ref 3.5–5.2)
Alkaline Phosphatase: 77 U/L (ref 39–117)
BUN: 7 mg/dL (ref 6–23)
CALCIUM: 8.9 mg/dL (ref 8.4–10.5)
CHLORIDE: 103 meq/L (ref 96–112)
CO2: 24 meq/L (ref 19–32)
Creat: 0.69 mg/dL (ref 0.50–1.10)
Glucose, Bld: 98 mg/dL (ref 70–99)
Potassium: 3.6 mEq/L (ref 3.5–5.3)
Sodium: 141 mEq/L (ref 135–145)
TOTAL PROTEIN: 6.9 g/dL (ref 6.0–8.3)
Total Bilirubin: 0.3 mg/dL (ref 0.2–1.2)

## 2015-01-26 NOTE — Patient Instructions (Signed)
Thanks for coming in today.   You will be called with the time for your MRI.   I will call you with the result of your ANA test.   Thanks for letting us take care of you!  Return after your MRI for follow up, and if your symptoms worsen certainly return sooner.    Sincerely,  Paula Compton, MD Family Medicine - PGY 2

## 2015-01-26 NOTE — Progress Notes (Signed)
Patient ID: [name] [name], female   DOB: Feb 27, 1976, 39 y.o.   MRN: [id number]   [facility] Aquilla Hacker, MD Phone: [phone]  Subjective:   # Muscle Pain / Tingling - Pt. 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  - No significant surgical history, no history of trauma, family history significant for Lupus in her mother, but otherwise no history of autoimmune disease other than DMII in other family members.  - She has had "deep burning" and "tingling" in her upper extremities as well as lower extremities for more than 6 months.  - She has had renewed migraine headaches over this time period as well that occur 2 x per week and are relieved by tylenol. She does not have aura or phonophobia / photophobia with these headaches.  - She has not had ocular pain, or blurry vision, or scotomata.  - Of note, she did start depo-progesterone for birth control around 6 months ago as well. She has not had a period since that time which is important because she used to have migraines mostly associated with her period.  - She has baseline weakness / limp on the left as a result of Cerebral palsy. However, she has not required the use of a cane prior to this and is now requiring a cane for help walking mostly due to the pain though she can't tell if she might be more weak as well.  - She has nighttime predominant tingling and numbness in her arms that seems mostly positional to her, though she does get tingling in her arms with the pain during the day time.  - The pain is not made better or worse with anything, and is described as a "burning from inside trying to come out".  - She has not noticed any upper extremity weakness.  - She has not had any back pain.  - She has not changed or used any new medications.  - She has not had any new injuries.  - Her  limb pain is not worse with turning her head, or made better positionally.  - She has never had imaging of her head or spine.   All relevant systems were reviewed and were negative unless otherwise noted in the HPI  Past Medical History Reviewed problem list.  Medications- reviewed and updated Current Outpatient Prescriptions  Medication Sig Dispense Refill  . albuterol (PROVENTIL HFA;VENTOLIN HFA) 108 (90 BASE) MCG/ACT inhaler Inhale 2 puffs into the lungs every 6 (six) hours as needed for wheezing. (Patient not taking: Reported on 11/15/2014) 1 Inhaler 0  . amLODipine (NORVASC) 10 MG tablet Take 1 tablet (10 mg total) by mouth daily. 90 tablet 3  . cyclobenzaprine (FLEXERIL) 10 MG tablet Take 1 tablet (10 mg total) by mouth 3 (three) times daily as needed for muscle spasms. 30 tablet 0  . fluticasone (FLOVENT HFA) 110 MCG/ACT inhaler Inhale 2 puffs into the lungs 2 (two) times daily. 1 Inhaler 12   No current facility-administered medications for this visit.   Chief complaint-noted No additions to family history Social history- patient is a non smoker  Objective: BP 136/96 mmHg  Pulse 102  Temp(Src) 98.3 F (36.8 C) (Oral)  Ht 4\' 9"  (1.448 m)  Wt 178 lb (80.74 kg)  BMI 38.51 kg/m2 Gen: NAD, alert, cooperative with exam, able to  ambulate up to exam table and get on.  HEENT: NCAT, EOMI, PERRL, TM's normal. No LAD, Difficulty visualizing optic fundi but no abnormalities grossly.  Neck: FROM, supple, No LAD CV: RRR, good S1/S2, no murmur,  Resp: CTABL, no wheezes, non-labored Abd: SNTND, BS present, no guarding or organomegaly Ext: No edema, warm, normal tone, moves UE/LE spontaneously Neuro: Alert and oriented, CN II-XII intact, Motor 5/5 in all extremities and muscle groups, No sensory deficits to exam, Gait - limp that is her baseline, able to get up on the exam table, Cerebellar intact.  Skin: no rashes no lesions  Assessment/Plan: See problem based a/p

## 2015-01-26 NOTE — Assessment & Plan Note (Signed)
Symptoms are somewhat vague, and difficult to pin down. I definitely gathered muscle pain, tingling, some potential subjective weakness, new headaches, without overt vision changes at this time. She has had an ESR preivously that was 6. TSH has been normal. Her mom had Lupus previously. This autoimmune history makes me concerned that this may be an initial presentation of an autoimmune type illness such as Multiple Sclerosis. This concern also heightened given the abnormal nature of her pain, new headaches, the tingling, and subjective weakness. She is in the right age category, but less at risk due to [place] race. Additionally, this may be from CP or a manifestation of fibromyalgia. At this point, it deserves some workup to rule out MS and other potential autoimmune causes prior to a diagnosis of exclusion such as fibromyalgia.   - MRI Brain w/wo contrast.  - Will get ANA - Follow up after MRI for further evaluation as indicated.  - Instructed to return if her symptoms acutely worsen or for any other concern.

## 2015-01-30 LAB — ANA: Anti Nuclear Antibody(ANA): NEGATIVE

## 2015-02-01 ENCOUNTER — Telehealth: Payer: Self-pay | Admitting: Family Medicine

## 2015-02-01 DIAGNOSIS — R740 Nonspecific elevation of levels of transaminase and lactic acid dehydrogenase [LDH]: Principal | ICD-10-CM

## 2015-02-01 DIAGNOSIS — R7401 Elevation of levels of liver transaminase levels: Secondary | ICD-10-CM

## 2015-02-01 NOTE — Telephone Encounter (Signed)
Called with the blood work results. ANA negative which is overall reassuring. CMET with very slightly elevated LFT's. Will trend in one month to ensure that they are going down. She has been taking tylenol for pain. Otherwise, MRI head is being scheduled and she will be called with appointment.   CGM MD

## 2015-03-05 ENCOUNTER — Ambulatory Visit: Payer: Medicare Other | Admitting: Family Medicine

## 2015-03-09 ENCOUNTER — Ambulatory Visit
Admission: RE | Admit: 2015-03-09 | Discharge: 2015-03-09 | Disposition: A | Discharge status: Home / Self Care | Payer: Medicare Other | Source: Ambulatory Visit | Attending: Family Medicine | Admitting: Family Medicine

## 2015-03-09 DIAGNOSIS — R202 Paresthesia of skin: Principal | ICD-10-CM

## 2015-03-09 DIAGNOSIS — R200 Anesthesia of skin: Secondary | ICD-10-CM

## 2015-03-09 MED ORDER — GADOBENATE DIMEGLUMINE 529 MG/ML IV SOLN
15.0000 mL | Freq: Once | INTRAVENOUS | Status: AC | PRN
  Administered 2015-03-09: 15 mL via INTRAVENOUS

## 2015-03-12 ENCOUNTER — Encounter: Payer: Self-pay | Admitting: Family Medicine

## 2015-03-12 ENCOUNTER — Telehealth: Payer: Self-pay | Admitting: Family Medicine

## 2015-03-12 NOTE — Telephone Encounter (Signed)
Attempted to call twice with results of MRI. Will send a letter at this time.   CGM MD

## 2015-03-15 ENCOUNTER — Telehealth: Payer: Self-pay | Admitting: Family Medicine

## 2015-03-15 NOTE — Telephone Encounter (Signed)
Pt called and would like to know what her MRI results are. [name] [name]

## 2015-03-15 NOTE — Telephone Encounter (Signed)
Will forward to MD who ordered this MRI. Jazmin Hartsell,CMA

## 2015-03-16 NOTE — Telephone Encounter (Signed)
Patient called back to get the MRI results for the second time.

## 2015-03-16 NOTE — Telephone Encounter (Signed)
Called again, and was able to get through. Discussed with her that the results were mostly normal. She continues to have symptoms similar to the way that she presented in the office whenever I evaluated her. I encouraged her to schedule an appointment with Dr. Erin Hearing (her PCP) for further evaluation in the clinic for further workup as needed.   CGM MD

## 2015-04-09 ENCOUNTER — Telehealth: Payer: Self-pay | Admitting: Family Medicine

## 2015-04-09 DIAGNOSIS — I10 Essential (primary) hypertension: Secondary | ICD-10-CM

## 2015-04-09 DIAGNOSIS — J4540 Moderate persistent asthma, uncomplicated: Secondary | ICD-10-CM

## 2015-04-09 NOTE — Telephone Encounter (Signed)
Pt called and wanted to make sure that her medications are all to date before she calls them into the pharmacy. jw

## 2015-04-10 NOTE — Telephone Encounter (Signed)
Pt is calling back and would like to know when she can get the updated medication list. jw

## 2015-04-10 NOTE — Telephone Encounter (Signed)
Pt is returning Latoya's call. She would like the medication list faxed to [facility] on [address]. Blima Rich

## 2015-04-10 NOTE — Telephone Encounter (Signed)
Faxed med list to [facility] [address] at fax#[phone]. Tnya Ades, CMA.

## 2015-04-10 NOTE — Telephone Encounter (Signed)
LM stating that copy of updated med list is ready for PU at front. I looked through pt chart and there were no changes with current meds. Please advise pt if she calls back. Almeter Westhoff, CMA.

## 2015-04-12 NOTE — Telephone Encounter (Signed)
[facility]ax was never received by [facility].  Please refax

## 2015-04-16 MED ORDER — FLUTICASONE PROPIONATE HFA 110 MCG/ACT IN AERO: 2.0000 | INHALATION_SPRAY | Freq: Two times a day (BID) | RESPIRATORY_TRACT | Status: DC

## 2015-04-16 MED ORDER — ALBUTEROL SULFATE HFA 108 (90 BASE) MCG/ACT IN AERS: 2.0000 | INHALATION_SPRAY | Freq: Four times a day (QID) | RESPIRATORY_TRACT | Status: DC | PRN

## 2015-04-16 MED ORDER — AMLODIPINE BESYLATE 10 MG PO TABS: 10.0000 mg | ORAL_TABLET | Freq: Every day | ORAL | Status: DC

## 2015-04-16 NOTE — Telephone Encounter (Signed)
Refill request from pt. Will forward to MD covering for PCP and PCP for review. Adams,Latoya, CMA. 

## 2015-04-16 NOTE — Telephone Encounter (Signed)
Please let patient know refills sent to pharmacy.

## 2015-04-16 NOTE — Telephone Encounter (Signed)
Need refill on her amlodipine and her Proventil and her fluticasone.

## 2015-04-16 NOTE — Telephone Encounter (Signed)
Patient is aware that scripts have been sent into pharmacy. Jazmin Hartsell,CMA

## 2015-05-03 ENCOUNTER — Encounter: Payer: Self-pay | Admitting: Family Medicine

## 2015-05-03 ENCOUNTER — Encounter: Payer: Self-pay | Admitting: *Deleted

## 2015-05-03 ENCOUNTER — Ambulatory Visit (INDEPENDENT_AMBULATORY_CARE_PROVIDER_SITE_OTHER): Payer: Medicare Other | Admitting: Family Medicine

## 2015-05-03 VITALS — BP 190/116 | HR 103 | Temp 98.5°F | Wt 182.0 lb

## 2015-05-03 DIAGNOSIS — Z3009 Encounter for other general counseling and advice on contraception: Secondary | ICD-10-CM | POA: Diagnosis present

## 2015-05-03 DIAGNOSIS — J4540 Moderate persistent asthma, uncomplicated: Secondary | ICD-10-CM | POA: Diagnosis not present

## 2015-05-03 DIAGNOSIS — Z30013 Encounter for initial prescription of injectable contraceptive: Secondary | ICD-10-CM | POA: Diagnosis not present

## 2015-05-03 DIAGNOSIS — I10 Essential (primary) hypertension: Secondary | ICD-10-CM

## 2015-05-03 DIAGNOSIS — M797 Fibromyalgia: Secondary | ICD-10-CM

## 2015-05-03 MED ORDER — DULOXETINE HCL 30 MG PO CPEP: 30.0000 mg | ORAL_CAPSULE | Freq: Two times a day (BID) | ORAL | Status: DC

## 2015-05-03 NOTE — Assessment & Plan Note (Signed)
Not at goal today though she takes BP meds at lunch. No symptoms. Urged PCP follow up.

## 2015-05-03 NOTE — Assessment & Plan Note (Signed)
Consistent H&P with this including tender point exam (14/18). Other causes, primarily autoimmune, have been ruled out. Will empirically treat with cymbalta and follow up with PCP in 4 - 6 weeks. Avoid opioids, muscle relaxants as no evidence of spasm on exam.

## 2015-05-03 NOTE — Progress Notes (Signed)
Subjective: [name] [name] is a 39 y.o. 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   Has aching pain described as deep "like soreness from exercise," non-radiating but involving all of the 4 extremities, starting June 2004 or 2005, on and off since then. She has a new headache and back train as well. She has taken tylenol which is no longer helping. She was taking flexeril which helped. Rx by Dr. Lamar Benes though she is out of these.   - ROS: Denies fever, cough, CP, palpitations, SOB, abdominal pain, N/V/D, blood in stool, change in bladder habits, arthralgias, and rash.  - Non-smoker, no EtOH, no drugs  Objective: BP 190/116 mmHg  Pulse 103  Temp(Src) 98.5 F (36.9 C) (Oral)  Wt 182 lb (82.555 kg) Gen: Well-appearing 39 y.o.female in NAD HEENT: MMM, posterior oropharynx clear Pulm: Non-labored; CTAB, no wheezes  CV: Regular rate, no murmur appreciated; distal pulses intact/symmetric GI: + BS; soft, non-tender, non-distended Skin: No rashes, wounds, ulcers Neuro: A&Ox3, CN II-XII without deficits MSK: Slow gait, no frank joint deformity/effusions, full active ROM, no paraspinal spasm, no midline tenderness in spine. 14 + tender points on exam.   MRI BRAIN 03/09/2015:  Multiple small deep white matter hyperintensities in both cerebral hemispheres. These are nonspecific. Differential diagnosis includes chronic microvascular ischemia, vasculitis, migraine headaches. Demyelinating disease less likely as the distribution of white matter disease is not typical for multiple sclerosis.  Assessment/Plan: [name] [name] is a 39 y.o. female presents to the same day clinic for extremity pain for the past 12 years consistent with fibromyalgia.

## 2015-05-03 NOTE — Patient Instructions (Signed)
Thank you for coming in today!  - Take cymbalta once a day for 7 days, then twice daily until you follow up with Dr. Erin Hearing in 6 weeks.  - Below is some information on fibromyalgia which is what we're going to treat to see if it gets better.   Our clinic's number is [phone]. Feel free to call any time with questions or concerns. We will answer any questions after hours with our 24-hour emergency line at that number as well.   - Dr. Bonner Puna   Myofascial Pain Syndrome and Fibromyalgia Myofascial pain syndrome and fibromyalgia are both pain disorders. This pain may be felt mainly in your muscles.   Myofascial pain syndrome:  Always has trigger points or tender points in the muscle that will cause pain when pressed. The pain may come and go.  Usually affects your neck, upper back, and shoulder areas. The pain often radiates into your arms and hands.  Fibromyalgia:  Has muscle pains and tenderness that come and go.  Is often associated with fatigue and sleep disturbances.  Has trigger points.  Tends to be long-lasting (chronic), but is not life-threatening. Fibromyalgia and myofascial pain are not the same. However, they often occur together. If you have both conditions, each can make the other worse. Both are common and can cause enough pain and fatigue to make day-to-day activities difficult.  CAUSES  The exact causes of fibromyalgia and myofascial pain are not known. People with certain gene types may be more likely to develop fibromyalgia. Some factors can be triggers for both conditions, such as:   Spine disorders.  Arthritis.  Severe injury (trauma) and other physical stressors.  Being under a lot of stress.  A medical illness. SIGNS AND SYMPTOMS  Fibromyalgia The main symptom of fibromyalgia is widespread pain and tenderness in your muscles. This can vary over time. Pain is sometimes described as stabbing, shooting, or burning. You may have tingling or numbness,  too. You may also have sleep problems and fatigue. You may wake up feeling tired and groggy (fibro fog). Other symptoms may include:   Bowel and bladder problems.  Headaches.  Visual problems.  Problems with odors and noises.  Depression or mood changes.  Painful menstrual periods (dysmenorrhea).  Dry skin or eyes. Myofascial pain syndrome Symptoms of myofascial pain syndrome include:   Tight, ropy bands of muscle.   Uncomfortable sensations in muscular areas, such as:  Aching.  Cramping.  Burning.  Numbness.  Tingling.   Muscle weakness.  Trouble moving certain muscles freely (range of motion). DIAGNOSIS  There are no specific tests to diagnose fibromyalgia or myofascial pain syndrome. Both can be hard to diagnose because their symptoms are common in many other conditions. Your health care provider may suspect one or both of these conditions based on your symptoms and medical history. Your health care provider will also do a physical exam.  The key to diagnosing fibromyalgia is having pain, fatigue, and other symptoms for more than three months that cannot be explained by another condition.  The key to diagnosing myofascial pain syndrome is finding trigger points in muscles that are tender and cause pain elsewhere in your body (referred pain). TREATMENT  Treating fibromyalgia and myofascial pain often requires a team of health care providers. This usually starts with your primary provider and a physical therapist. You may also find it helpful to work with alternative health care providers, such as massage therapists or acupuncturists. Treatment for fibromyalgia may include medicines. This may  include nonsteroidal anti-inflammatory drugs (NSAIDs), along with other medicines.  Treatment for myofascial pain may also include:  NSAIDs.  Cooling and stretching of muscles.  Trigger point injections.  Sound wave (ultrasound) treatments to stimulate muscles. HOME CARE  INSTRUCTIONS   Take medicines only as directed by your health care provider.  Exercise as directed by your health care provider or physical therapist.  Try to avoid stressful situations.  Practice relaxation techniques to control your stress. You may want to try:  Biofeedback.  Visual imagery.  Hypnosis.  Muscle relaxation.  Yoga.  Meditation.  Talk to your health care provider about alternative treatments, such as acupuncture or massage treatment.  Maintain a healthy lifestyle. This includes eating a healthy diet and getting enough sleep.  Consider joining a support group.  Do not do activities that stress or strain your muscles. That includes repetitive motions and heavy lifting. SEEK MEDICAL CARE IF:   You have new symptoms.  Your symptoms get worse.  You have side effects from your medicines.  You have trouble sleeping.  Your condition is causing depression or anxiety. FOR MORE INFORMATION   National Fibromyalgia Association: http://www.fmaware.orgwww.fmaware.[employer]pex: http://www.arthritis.orgwww.arthritis.org  American Chronic Pain Association: StreetWrestling.at.https://stevens.biz/   This information is not intended to replace advice given to you by your health care provider. Make sure you discuss any questions you have with your health care provider.   Document Released: 07/14/2005 Document Revised: 08/04/2014 Document Reviewed: 04/19/2014 Elsevier Interactive Patient Education [employer].

## 2015-08-06 ENCOUNTER — Telehealth: Payer: Self-pay | Admitting: Family Medicine

## 2015-08-06 NOTE — Telephone Encounter (Signed)
[facility] After Hours Telephone Line  Person calling: Elanna Ozier Relationship to patient: Self  Chief complaint: Congestion and shortness of breath  Patient states that started having symptoms of congestion and dyspnea three days ago. Symptoms have continued to worsen. Se states that she does not have albuterol on her and that she has a prescription at the pharmacy. No fevers.  Advised patient that she should get her albuterol inhaler filled. Likely an aspect of anxiety. Recommended if no improvement with albuterol, that she make an appointment to be seen. Patient acknowledged understanding. All questions answered.  Cordelia Poche, MD PGY-3, [facility] 08/06/2015, 6:17 AM

## 2015-08-07 ENCOUNTER — Encounter: Payer: Self-pay | Admitting: Family Medicine

## 2015-08-07 ENCOUNTER — Ambulatory Visit (INDEPENDENT_AMBULATORY_CARE_PROVIDER_SITE_OTHER): Payer: Medicare Other | Admitting: Family Medicine

## 2015-08-07 ENCOUNTER — Telehealth: Payer: Self-pay | Admitting: Family Medicine

## 2015-08-07 VITALS — BP 130/89 | HR 114 | Temp 98.1°F | Ht <= 58 in | Wt 176.0 lb

## 2015-08-07 DIAGNOSIS — Z3009 Encounter for other general counseling and advice on contraception: Secondary | ICD-10-CM | POA: Diagnosis not present

## 2015-08-07 DIAGNOSIS — J4540 Moderate persistent asthma, uncomplicated: Secondary | ICD-10-CM

## 2015-08-07 DIAGNOSIS — R000 Tachycardia, unspecified: Secondary | ICD-10-CM | POA: Insufficient documentation

## 2015-08-07 DIAGNOSIS — Z30013 Encounter for initial prescription of injectable contraceptive: Secondary | ICD-10-CM | POA: Diagnosis not present

## 2015-08-07 MED ORDER — HYDROCODONE-HOMATROPINE 5-1.5 MG/5ML PO SYRP: 5.0000 mL | ORAL_SOLUTION | Freq: Three times a day (TID) | ORAL | Status: DC | PRN

## 2015-08-07 MED ORDER — MONTELUKAST SODIUM 10 MG PO TABS: 10.0000 mg | ORAL_TABLET | Freq: Every day | ORAL | Status: DC

## 2015-08-07 NOTE — Assessment & Plan Note (Signed)
URI likely viral in patient with asthma. Pulmonary exam is completely benign. O2 sat at 99% RA x 2. Patient was not in distress today and did not hear her cough throughout today's visit. I do not feel the need to have her on oral steroid. Continue Home albuterol as needed as well as her Flovent as prescribed by PCP. There might be some allergic component to her cough/ asthma. Singulair prescribed today. Return precaution given. F/U with PCP tomorrow. She stated she already scheduled that appointment.

## 2015-08-07 NOTE — Progress Notes (Signed)
Subjective:     Patient ID: [name] [name], female   DOB: 1976-05-05, 40 y.o.   MRN: [id number]  Cough This is a new problem. Episode onset: 4-5 days ago. The problem has been gradually improving. The problem occurs every few minutes. The cough is productive of sputum. Associated symptoms include nasal congestion, rhinorrhea, a sore throat, shortness of breath and wheezing. Pertinent negatives include no chest pain, chills, fever or headaches. Associated symptoms comments: Her chest hurt whenever she coughs only. She has some chest tightness.. The symptoms are aggravated by cold air (Cough is worse at night). Risk factors: She does not smoke, no cats and dogs ecposure. Cold weather might have contributed to her cough and congestion. She has tried a beta-agonist inhaler and OTC cough suppressant (She is also on Flovent for asthma) for the symptoms. The treatment provided moderate relief. Her past medical history is significant for asthma and environmental allergies. There is no history of bronchiectasis, bronchitis, COPD, emphysema or pneumonia.  She has been using been using her albuterol at least twice a day, this is unsual for her. Tachycardia: Patient denies any chest pain, no palpitations. She has difficulty breathing with cough and congestion otherwise no other cardiac symptoms. She stated she is uncertain if she had been told in the past that her HR is elevated.  Current Outpatient Prescriptions on File Prior to Visit  Medication Sig Dispense Refill  . albuterol (PROVENTIL HFA;VENTOLIN HFA) 108 (90 BASE) MCG/ACT inhaler Inhale 2 puffs into the lungs every 6 (six) hours as needed for wheezing. 1 Inhaler 2  . amLODipine (NORVASC) 10 MG tablet Take 1 tablet (10 mg total) by mouth daily. 90 tablet 0  . fluticasone (FLOVENT HFA) 110 MCG/ACT inhaler Inhale 2 puffs into the lungs 2 (two) times daily. 1 Inhaler 3  . DULoxetine (CYMBALTA) 30 MG capsule Take 1 capsule (30 mg total) by mouth 2 (two) times  daily. (Patient not taking: Reported on 08/07/2015) 60 capsule 1   No current facility-administered medications on file prior to visit.   Past Medical History  Diagnosis Date  . Abnormal Pap smear   . Asthma   . Fibroid   . Depression     no medication treatment   . 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  HENT: Positive for rhinorrhea and sore throat.   Respiratory: Positive for cough, shortness of breath and wheezing.   Cardiovascular: Negative for chest pain, palpitations and leg swelling.  Gastrointestinal: Negative.   Allergic/Immunologic: Positive for environmental allergies.  Neurological: Negative for headaches.  All other systems reviewed and are negative.  Filed Vitals:   08/07/15 1020 08/07/15 1039  BP: 130/89   Pulse: 118 114  Temp: 98.1 F (36.7 C)   TempSrc: Oral   Height: 4\' 9"  (1.448 m)   Weight: 176 lb (79.833 kg)   SpO2: 99% 99%       Objective:   Physical Exam  Constitutional: She is oriented to person, place, and time. She appears well-developed. No distress.  Cardiovascular: Regular rhythm and normal heart sounds.  Tachycardia present.   No murmur heard. Pulmonary/Chest: Effort normal and breath sounds normal. No respiratory distress. She has no decreased breath sounds. She has no wheezes. She has no rhonchi. She has no rales.  Abdominal: Soft. Bowel sounds are normal. She exhibits no distension and no mass. There is no tenderness.  Musculoskeletal: Normal range of motion.  Neurological: She is alert and oriented to person, place, and time. No cranial nerve deficit.  Nursing note and vitals reviewed.      Assessment:     Cough: URI and Asthma Tachycardia     Plan:     Check problem list.

## 2015-08-07 NOTE — Patient Instructions (Signed)
It was nice seeing you. I am sorry about your cough and congestion, likely viral infection on top of your asthma. The good thing is you are not wheezing and your Oxygen level is fine. Continue current home regimen for your asthma. You might have some allergy component to your asthma. Please start Singulair. Also Hycodan as needed for cough and congestion. See PCP tomorrow to reassess you for your increased heart rate. Call or go to the emergency if having SOB, chest pain and palpitations.

## 2015-08-07 NOTE — Assessment & Plan Note (Signed)
Currently asymptomatic. I reviewed her vital flow sheet, she has had elevation of her HR for a while. No EKG on record. ?? Tachycardia related to use of albuterol inhaler which she used few min before coming to the clinic. Since she is completely asymptomatic EKG not done today. She has f/u with PCP tomorrow for reassessment. If tachycardia is persistent it might be necessary to obtain EKG and get full work up done. Return precaution discussed with her. She agreed with plan.

## 2015-08-08 ENCOUNTER — Ambulatory Visit: Payer: Self-pay | Admitting: Family Medicine

## 2015-08-15 ENCOUNTER — Ambulatory Visit: Payer: Self-pay | Admitting: Family Medicine

## 2015-08-20 ENCOUNTER — Emergency Department (HOSPITAL_COMMUNITY)
Admission: EM | Admit: 2015-08-20 | Discharge: 2015-08-21 | Disposition: A | Discharge status: Home / Self Care | Payer: Medicare Other | Attending: Emergency Medicine | Admitting: Emergency Medicine

## 2015-08-20 ENCOUNTER — Encounter: Payer: Self-pay | Admitting: Family Medicine

## 2015-08-20 ENCOUNTER — Ambulatory Visit (INDEPENDENT_AMBULATORY_CARE_PROVIDER_SITE_OTHER): Payer: Medicare Other | Admitting: Family Medicine

## 2015-08-20 ENCOUNTER — Encounter (HOSPITAL_COMMUNITY): Payer: Self-pay | Admitting: Emergency Medicine

## 2015-08-20 VITALS — BP 163/104 | HR 103 | Temp 98.3°F | Wt 174.0 lb

## 2015-08-20 DIAGNOSIS — F329 Major depressive disorder, single episode, unspecified: Secondary | ICD-10-CM | POA: Diagnosis not present

## 2015-08-20 DIAGNOSIS — Z8669 Personal history of other diseases of the nervous system and sense organs: Secondary | ICD-10-CM | POA: Insufficient documentation

## 2015-08-20 DIAGNOSIS — J45909 Unspecified asthma, uncomplicated: Secondary | ICD-10-CM | POA: Insufficient documentation

## 2015-08-20 DIAGNOSIS — R1032 Left lower quadrant pain: Secondary | ICD-10-CM | POA: Insufficient documentation

## 2015-08-20 DIAGNOSIS — H9191 Unspecified hearing loss, right ear: Secondary | ICD-10-CM | POA: Insufficient documentation

## 2015-08-20 DIAGNOSIS — R740 Nonspecific elevation of levels of transaminase and lactic acid dehydrogenase [LDH]: Secondary | ICD-10-CM | POA: Insufficient documentation

## 2015-08-20 DIAGNOSIS — Z79899 Other long term (current) drug therapy: Secondary | ICD-10-CM | POA: Diagnosis not present

## 2015-08-20 DIAGNOSIS — Z3009 Encounter for other general counseling and advice on contraception: Secondary | ICD-10-CM | POA: Diagnosis not present

## 2015-08-20 DIAGNOSIS — Z791 Long term (current) use of non-steroidal anti-inflammatories (NSAID): Secondary | ICD-10-CM | POA: Insufficient documentation

## 2015-08-20 DIAGNOSIS — J4540 Moderate persistent asthma, uncomplicated: Secondary | ICD-10-CM | POA: Diagnosis not present

## 2015-08-20 DIAGNOSIS — R112 Nausea with vomiting, unspecified: Secondary | ICD-10-CM | POA: Diagnosis present

## 2015-08-20 DIAGNOSIS — R7401 Elevation of levels of liver transaminase levels: Secondary | ICD-10-CM

## 2015-08-20 DIAGNOSIS — M797 Fibromyalgia: Secondary | ICD-10-CM | POA: Diagnosis not present

## 2015-08-20 DIAGNOSIS — R6883 Chills (without fever): Secondary | ICD-10-CM | POA: Diagnosis not present

## 2015-08-20 DIAGNOSIS — Z86018 Personal history of other benign neoplasm: Secondary | ICD-10-CM | POA: Diagnosis not present

## 2015-08-20 DIAGNOSIS — Z30013 Encounter for initial prescription of injectable contraceptive: Secondary | ICD-10-CM | POA: Diagnosis not present

## 2015-08-20 LAB — COMPREHENSIVE METABOLIC PANEL
ALT: 190 U/L — ABNORMAL HIGH (ref 14–54)
ANION GAP: 14 (ref 5–15)
AST: 432 U/L — ABNORMAL HIGH (ref 15–41)
Albumin: 4.2 g/dL (ref 3.5–5.0)
Alkaline Phosphatase: 95 U/L (ref 38–126)
BUN: 7 mg/dL (ref 6–20)
CHLORIDE: 103 mmol/L (ref 101–111)
CO2: 23 mmol/L (ref 22–32)
CREATININE: 0.73 mg/dL (ref 0.44–1.00)
Calcium: 9.4 mg/dL (ref 8.9–10.3)
Glucose, Bld: 161 mg/dL — ABNORMAL HIGH (ref 65–99)
POTASSIUM: 4 mmol/L (ref 3.5–5.1)
SODIUM: 140 mmol/L (ref 135–145)
Total Bilirubin: 0.7 mg/dL (ref 0.3–1.2)
Total Protein: 7.9 g/dL (ref 6.5–8.1)

## 2015-08-20 LAB — CBC
HCT: 44.6 % (ref 36.0–46.0)
HEMOGLOBIN: 14.8 g/dL (ref 12.0–15.0)
MCH: 32.3 pg (ref 26.0–34.0)
MCHC: 33.2 g/dL (ref 30.0–36.0)
MCV: 97.4 fL (ref 78.0–100.0)
PLATELETS: 344 10*3/uL (ref 150–400)
RBC: 4.58 MIL/uL (ref 3.87–5.11)
RDW: 13 % (ref 11.5–15.5)
WBC: 6.6 10*3/uL (ref 4.0–10.5)

## 2015-08-20 LAB — LIPASE, BLOOD: LIPASE: 18 U/L (ref 11–51)

## 2015-08-20 MED ORDER — GABAPENTIN 100 MG PO CAPS: 100.0000 mg | ORAL_CAPSULE | Freq: Three times a day (TID) | ORAL | Status: DC

## 2015-08-20 MED ORDER — ONDANSETRON HCL 4 MG/2ML IJ SOLN
4.0000 mg | Freq: Once | INTRAMUSCULAR | Status: AC
  Administered 2015-08-20: 4 mg via INTRAVENOUS
  Filled 2015-08-20: qty 2

## 2015-08-20 MED ORDER — SODIUM CHLORIDE 0.9 % IV BOLUS (SEPSIS)
1000.0000 mL | Freq: Once | INTRAVENOUS | Status: AC
  Administered 2015-08-20: 1000 mL via INTRAVENOUS

## 2015-08-20 NOTE — ED Notes (Signed)
Patient presents for vomiting x2 starting today, seen at PCP for fibromyalgia, unable to keep fluids and food down.

## 2015-08-20 NOTE — ED Provider Notes (Signed)
CSN: [id number]     Arrival date & time 08/20/15  1847 History  By signing my name below, I, [name] [name], attest that this documentation has been prepared under the direction and in the presence of Shanon Rosser, MD. Electronically Signed: Helane [name], ED Scribe. 08/20/2015. 11:27 PM.    Chief Complaint  Patient presents with  . Vomiting    The history is provided by the patient. No language interpreter was used.   HPI Comments: [name] [name] is a 40 y.o. female who presents to the Emergency Department complaining of vomiting onset 9 hours ago. She notes she was seen by her PCP for a fibromyalgia flare up and was given gabapentin, which she has not been able to keep down. She notes she was on Cymbalta without relief, which is why she was switched to the new medication. She reports associated LLQ pressure, chills after vomiting and nausea. She notes a PSHx of C-section. Pt denies fever and diarrhea.   Past Medical History  Diagnosis Date  . Abnormal Pap smear   . Asthma   . Fibroid   . Depression     no medication treatment   . 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 Mouth surgery      40 years old  . Cesarean section  03/20/2011    Procedure: CESAREAN SECTION;  Surgeon: Blane Ohara Meisinger;  Location: [facility];  Service: Gynecology;  Laterality: N/A;  Primary Female at 63  . Myomectomy  03/20/2011    Procedure: MYOMECTOMY;  Surgeon: Blane Ohara Meisinger;  Location: [facility];  Service: Gynecology;  Laterality: N/A;   Family History  Problem Relation Age of Onset  . Birth defects Cousin     Down Syndrome  . Anesthesia problems Mother    Social History  Substance Use Topics  . Smoking status: Never Smoker   . Smokeless tobacco: None  . Alcohol Use: 0.6 oz/week    1 Glasses of wine per week     Comment: 1 glass wine per day until known pregnancy at approx 12 weeks   OB History    Gravida Para  Term Preterm AB TAB SAB Ectopic Multiple Living   1 1 1  0 0 0 0 0 0 1     Review of Systems  All other systems reviewed and are negative.   Allergies  Pineapple and Tomato  Home Medications   Prior to Admission medications   Medication Sig Start Date End Date Taking? Authorizing Provider  albuterol (PROVENTIL HFA;VENTOLIN HFA) 108 (90 BASE) MCG/ACT inhaler Inhale 2 puffs into the lungs every 6 (six) hours as needed for wheezing. 04/16/15  Yes Blane Ohara McDiarmid, MD  fluticasone (FLOVENT HFA) 110 MCG/ACT inhaler Inhale 2 puffs into the lungs 2 (two) times daily. 04/16/15  Yes Blane Ohara McDiarmid, MD  HYDROcodone-homatropine [facility]) 5-1.5 MG/5ML syrup Take 5 mLs by mouth every 8 (eight) hours as needed for cough. 08/07/15  Yes Kinnie Feil, MD  amLODipine (NORVASC) 10 MG tablet Take 1 tablet (10 mg total) by mouth daily. Patient not taking: Reported on 08/20/2015 04/16/15   Blane Ohara McDiarmid, MD  gabapentin (NEURONTIN) 100 MG capsule Take 1 capsule (100 mg total) by mouth 3 (three) times daily. Can increase slowly to 3 capsules 3 times a day Patient not taking: Reported on 08/20/2015 08/20/15   Frazier Richards, MD  montelukast (SINGULAIR) 10 MG tablet  Take 1 tablet (10 mg total) by mouth at bedtime. Patient not taking: Reported on 08/20/2015 08/07/15   Kinnie Feil, MD   BP 155/137 mmHg  Pulse 107  Temp(Src) 97.9 F (36.6 C) (Oral)  Resp 20  SpO2 100% Physical Exam General: Well-developed, well-nourished female in no acute distress; appearance consistent with age of record HENT: normocephalic; atraumatic Eyes: pupils equal, round and reactive to light; extraocular muscles intact Neck: supple Heart: tachycardia, regular rhythm Lungs: clear to auscultation bilaterally Abdomen: soft; nondistended; nontender; no masses or hepatosplenomegaly; bowel sounds present Extremities: No deformity; full range of motion; pulses normal Neurologic: Awake, alert and oriented; motor function intact in  all extremities and symmetric; no facial droop Skin: Warm and dry Psychiatric: Normal mood and affect   ED Course  Procedures   MDM   Nursing notes and vitals signs, including pulse oximetry, reviewed.  Summary of this visit's results, reviewed by myself:  Labs:  Results for orders placed or performed during the hospital encounter of 08/20/15 (from the past 24 hour(s))  Lipase, blood     Status: None   Collection Time: 08/20/15  7:57 PM  Result Value Ref Range   Lipase 18 11 - 51 U/L  Comprehensive metabolic panel     Status: Abnormal   Collection Time: 08/20/15  7:57 PM  Result Value Ref Range   Sodium 140 135 - 145 mmol/L   Potassium 4.0 3.5 - 5.1 mmol/L   Chloride 103 101 - 111 mmol/L   CO2 23 22 - 32 mmol/L   Glucose, Bld 161 (H) 65 - 99 mg/dL   BUN 7 6 - 20 mg/dL   Creatinine, Ser 0.73 0.44 - 1.00 mg/dL   Calcium 9.4 8.9 - 10.3 mg/dL   Total Protein 7.9 6.5 - 8.1 g/dL   Albumin 4.2 3.5 - 5.0 g/dL   AST 432 (H) 15 - 41 U/L   ALT 190 (H) 14 - 54 U/L   Alkaline Phosphatase 95 38 - 126 U/L   Total Bilirubin 0.7 0.3 - 1.2 mg/dL   GFR calc non Af Amer >60 >60 mL/min   GFR calc Af Amer >60 >60 mL/min   Anion gap 14 5 - 15  CBC     Status: None   Collection Time: 08/20/15  7:57 PM  Result Value Ref Range   WBC 6.6 4.0 - 10.5 K/uL   RBC 4.58 3.87 - 5.11 MIL/uL   Hemoglobin 14.8 12.0 - 15.0 g/dL   HCT 44.6 36.0 - 46.0 %   MCV 97.4 78.0 - 100.0 fL   MCH 32.3 26.0 - 34.0 pg   MCHC 33.2 30.0 - 36.0 g/dL   RDW 13.0 11.5 - 15.5 %   Platelets 344 150 - 400 K/uL   1:08 AM Patient feeling better and is able to drink fluids without emesis. She was advised of her elevated transaminases, significantly higher than they were 6 months ago. She was advised follow-up with her PCP for further evaluation. Her liver was not tender nor enlarged on examination.  Final diagnoses:  Nausea and vomiting in adult  Elevated transaminase level   I personally performed the services  described in this documentation, which was scribed in my presence. The recorded information has been reviewed and is accurate.   Shanon Rosser, MD 08/21/15 [phone]

## 2015-08-21 DIAGNOSIS — R112 Nausea with vomiting, unspecified: Secondary | ICD-10-CM | POA: Diagnosis not present

## 2015-08-21 MED ORDER — ONDANSETRON 8 MG PO TBDP: 8.0000 mg | ORAL_TABLET | Freq: Three times a day (TID) | ORAL | Status: DC | PRN

## 2015-08-21 NOTE — Discharge Instructions (Signed)
Nausea, Adult °Nausea is the feeling that you have an upset stomach or have to vomit. Nausea by itself is not likely a serious concern, but it may be an early sign of more serious medical problems. As nausea gets worse, it can lead to vomiting. If vomiting develops, there is the risk of dehydration.  °CAUSES  °· Viral infections. °· Food poisoning. °· Medicines. °· Pregnancy. °· Motion sickness. °· Migraine headaches. °· Emotional distress. °· Severe pain from any source. °· Alcohol intoxication. °HOME CARE INSTRUCTIONS °· Get plenty of rest. °· Ask your caregiver about specific rehydration instructions. °· Eat small amounts of food and sip liquids more often. °· Take all medicines as told by your caregiver. °SEEK MEDICAL CARE IF: °· You have not improved after 2 days, or you get worse. °· You have a headache. °SEEK IMMEDIATE MEDICAL CARE IF:  °· You have a fever. °· You faint. °· You keep vomiting or have blood in your vomit. °· You are extremely weak or dehydrated. °· You have dark or bloody stools. °· You have severe chest or abdominal pain. °MAKE SURE YOU: °· Understand these instructions. °· Will watch your condition. °· Will get help right away if you are not doing well or get worse. °  °This information is not intended to replace advice given to you by your health care provider. Make sure you discuss any questions you have with your health care provider. °  °Document Released: 08/21/2004 Document Revised: 08/04/2014 Document Reviewed: 03/26/2011 °Elsevier Interactive Patient Education ©2016 [employer] ° °

## 2015-08-22 ENCOUNTER — Ambulatory Visit (INDEPENDENT_AMBULATORY_CARE_PROVIDER_SITE_OTHER): Payer: Medicare Other | Admitting: Family Medicine

## 2015-08-22 ENCOUNTER — Ambulatory Visit
Admission: RE | Admit: 2015-08-22 | Discharge: 2015-08-22 | Disposition: A | Discharge status: Home / Self Care | Payer: Medicare Other | Source: Ambulatory Visit | Attending: Family Medicine | Admitting: Family Medicine

## 2015-08-22 VITALS — BP 160/111 | HR 100 | Temp 98.1°F | Ht <= 58 in | Wt 173.4 lb

## 2015-08-22 DIAGNOSIS — Z3009 Encounter for other general counseling and advice on contraception: Secondary | ICD-10-CM | POA: Diagnosis not present

## 2015-08-22 DIAGNOSIS — R1032 Left lower quadrant pain: Secondary | ICD-10-CM

## 2015-08-22 DIAGNOSIS — R5383 Other fatigue: Secondary | ICD-10-CM

## 2015-08-22 DIAGNOSIS — R748 Abnormal levels of other serum enzymes: Secondary | ICD-10-CM | POA: Insufficient documentation

## 2015-08-22 DIAGNOSIS — R112 Nausea with vomiting, unspecified: Secondary | ICD-10-CM

## 2015-08-22 DIAGNOSIS — F1010 Alcohol abuse, uncomplicated: Secondary | ICD-10-CM | POA: Insufficient documentation

## 2015-08-22 DIAGNOSIS — R740 Nonspecific elevation of levels of transaminase and lactic acid dehydrogenase [LDH]: Secondary | ICD-10-CM

## 2015-08-22 DIAGNOSIS — J4540 Moderate persistent asthma, uncomplicated: Secondary | ICD-10-CM | POA: Diagnosis not present

## 2015-08-22 DIAGNOSIS — K582 Mixed irritable bowel syndrome: Secondary | ICD-10-CM

## 2015-08-22 DIAGNOSIS — Z30013 Encounter for initial prescription of injectable contraceptive: Secondary | ICD-10-CM | POA: Diagnosis not present

## 2015-08-22 DIAGNOSIS — R7309 Other abnormal glucose: Secondary | ICD-10-CM

## 2015-08-22 HISTORY — DX: Alcohol abuse, uncomplicated: F10.10

## 2015-08-22 LAB — POCT GLYCOSYLATED HEMOGLOBIN (HGB A1C): Hemoglobin A1C: 5.2

## 2015-08-22 LAB — POCT URINE PREGNANCY: PREG TEST UR: NEGATIVE

## 2015-08-22 MED ORDER — PROMETHAZINE HCL 12.5 MG PO TABS: 12.5000 mg | ORAL_TABLET | Freq: Four times a day (QID) | ORAL | Status: DC | PRN

## 2015-08-22 MED ORDER — POLYETHYLENE GLYCOL 3350 17 GM/SCOOP PO POWD: 17.0000 g | Freq: Two times a day (BID) | ORAL | Status: DC | PRN

## 2015-08-22 MED ORDER — DOCUSATE SODIUM 100 MG PO CAPS
100.0000 mg | ORAL_CAPSULE | Freq: Two times a day (BID) | ORAL | Status: DC
Start: 2015-08-22 — End: 2017-01-14

## 2015-08-22 NOTE — Assessment & Plan Note (Signed)
2-3 large glasses nightly x 1 year "treating fibromyalgia pains" - Advised reducing alcohol consumption - f/u with PCP in 1-2 weeks

## 2015-08-22 NOTE — Progress Notes (Signed)
   Subjective:   [name] [name] is a 40 y.o. female with a history of CP, IBS, asthma and recent fibromyalgia diagnosis here for diffuse muscle pain  EXTREMITY PAIN  Location: thighs and upper arms primarily Pain started: 3 days ago Pain is: constant Severity: 8/10 Medications tried: cymbalta helped but made her too tired to work so she stopped Recent trauma: no Similar pain previously: yes, when fibromyalgia flares  Symptoms Redness:no Swelling:no Fever: no Weakness: no Weight loss: no Rash: no  Review of Systems:  Per HPI.    PMH, PSH, Medications, Allergies, and FmHx reviewed and updated in EMR.  Social History: never smoker  Objective:  BP 163/104 mmHg  Pulse 103  Temp(Src) 98.3 F (36.8 C) (Oral)  Wt 174 lb (78.926 kg)  Gen:  40 y.o. female in NAD HEENT: NCAT, MMM, EOMI, PERRL, anicteric sclerae CV: RRR, no MRG, no JVD Resp: Non-labored, CTAB, no wheezes noted Abd: Soft, NTND, BS present, no guarding or organomegaly Ext: WWP, no edema MSK: tenderness of major muscle groups in all 4 extremities, no bony tenderness or joint tenderness Neuro: Alert and oriented, speech normal      Chemistry      Component Value Date/Time   NA 140 08/20/2015 1957   K 4.0 08/20/2015 1957   CL 103 08/20/2015 1957   CO2 23 08/20/2015 1957   BUN 7 08/20/2015 1957   CREATININE 0.73 08/20/2015 1957   CREATININE 0.69 01/26/2015 1125      Component Value Date/Time   CALCIUM 9.4 08/20/2015 1957   ALKPHOS 95 08/20/2015 1957   AST 432* 08/20/2015 1957   ALT 190* 08/20/2015 1957   BILITOT 0.7 08/20/2015 1957      Lab Results  Component Value Date   WBC 6.6 08/20/2015   HGB 14.8 08/20/2015   HCT 44.6 08/20/2015   MCV 97.4 08/20/2015   PLT 344 08/20/2015   Lab Results  Component Value Date   TSH 2.720 10/05/2013   Lab Results  Component Value Date   HGBA1C 5.2 08/22/2015   Assessment & Plan:     [name] [name] is a 40 y.o. female here for muscle  pain  Fibromyalgia Diffuse muscle aches and tenderness, cymbalta stopped due to SEs. Worsening pain for the past 3 days.  - discussed other treatment options, given medicaid will try gabapentin, slow titration to minimize sedation - f/u with PCP in 2-3 weeks   Beverlyn Roux, MD, MPH [facility] PGY-3 08/22/2015 8:03 PM

## 2015-08-22 NOTE — Assessment & Plan Note (Signed)
Hx of constipation.  - KUB for further evaluation - Treat with enema's, colace and miralax prn - f/u with PCP in 1-2 weeks

## 2015-08-22 NOTE — Progress Notes (Signed)
   Subjective:    Patient ID: [name] [name], female    DOB: [birth year], 40 y.o.   MRN: [id number]  Seen for Same day visit for   CC: Nausea, vomiting  She reports nausea beginning 1/21 and started developing intermittent vomiting on 1/23.  At that time she also reports left lower quadrant abdominal pain.  She was seen in the ED and improved with nausea medications as well as fluids.  However, she was unable to afford nausea medication prescribed at discharge.  Reports she felt better yesterday with still some nausea however her vomiting again returned this morning.  Vomiting worse with certain smells (foods), but has been able to keep down some liquids.  She reports history of constipation requiring enema's and disimpaction as well as Hx of IBS with intermittent constipation and diarrhea.  She reports daily loose stool for the past several weeks.  Denies any recent fevers, chills or URI symptoms.  She reports drinking 2-3 glasses (large) of wine nightly for the past year to treat her "fibromyalgia pain".  She also reports being sexually active with a boyfriend who she recently found out has not been faithful; no condom use. Denies illicit drug use.  Previous C-section but no other abdominal surgeries.   Review of Systems   See HPI for ROS. Objective:  BP 160/111 mmHg  Pulse 100  Temp(Src) 98.1 F (36.7 C) (Oral)  Ht 4\' 9"  (1.448 m)  Wt 173 lb 6.4 oz (78.654 kg)  BMI 37.51 kg/m2  LMP 08/18/2015  General: NAD Cardiac: RRR, normal heart sounds, no murmurs. 2+ radial and PT pulses bilaterally Respiratory: CTAB, normal effort Abdomen: soft, mild LLQ tenderness, nondistended, no hepatic or splenomegaly. Bowel sounds present Extremities: no edema or cyanosis. WWP. Skin: warm and dry, no rashes noted    Assessment & Plan:   Nausea and vomiting Intermittent nausea, vomiting for 3 days now.  Also associated with left lower quadrant abdominal pain, transaminitis.  Current etiology unknown.   History of constipation associated with cerebral palsy, IBS, associated with fibromyalgia as well as ongoing alcohol abuse.  Reports drinking 2-3 large glasses nightly for one year.  Currently without signs of acute abdomen or dehydration. Most likely related to constipation complicated by alcohol use.  - Phenergan 12.5 every 6 hours when necessary - Advised reducing alcohol consumption - Advised NO Tylenol - Checking TSH, A1c and KUB - Advised enemas which have worked for her in the past as well as colace qd and miralax BID prn - See AVS for return precaution   Abnormal transaminases AST/ALT ratio greater than 2. Most likely related to alcohol abuse: 2-3 large glasses nightly x 1 year "treating fibromyalgia pains". However , also reports infidelity with boyfriend and sex without condom use. No RUQ abdominal pain - Check acute hepatitis panel and iron panel - Advised against continued Tylenol use - Defer right upper quadrant ultrasound for now. No pain or elevated alkaline phosphatase or bili - recommended f/u with PCP in 1-2 weeks  Alcohol abuse 2-3 large glasses nightly x 1 year "treating fibromyalgia pains" - Advised reducing alcohol consumption - f/u with PCP in 1-2 weeks  Abdominal pain, LLQ (left lower quadrant) Hx of constipation.  - KUB for further evaluation - Treat with enema's, colace and miralax prn - f/u with PCP in 1-2 weeks

## 2015-08-22 NOTE — Assessment & Plan Note (Signed)
AST/ALT ratio greater than 2. Most likely related to alcohol abuse: 2-3 large glasses nightly x 1 year "treating fibromyalgia pains". However , also reports infidelity with boyfriend and sex without condom use. No RUQ abdominal pain - Check acute hepatitis panel and iron panel - Advised against continued Tylenol use - Defer right upper quadrant ultrasound for now. No pain or elevated alkaline phosphatase or bili - recommended f/u with PCP in 1-2 weeks

## 2015-08-22 NOTE — Assessment & Plan Note (Signed)
Intermittent nausea, vomiting for 3 days now.  Also associated with left lower quadrant abdominal pain, transaminitis.  Current etiology unknown.  History of constipation associated with cerebral palsy, IBS, associated with fibromyalgia as well as ongoing alcohol abuse.  Reports drinking 2-3 large glasses nightly for one year.  Currently without signs of acute abdomen or dehydration. Most likely related to constipation complicated by alcohol use.  - Phenergan 12.5 every 6 hours when necessary - Advised reducing alcohol consumption - Advised NO Tylenol - Checking TSH, A1c and KUB - Advised enemas which have worked for her in the past as well as colace qd and miralax BID prn - See AVS for return precaution

## 2015-08-22 NOTE — Patient Instructions (Signed)
It was great seeing you today. Your abdominal pain, nausea, vomiting and elevated liver enzymes could be caused by several things. See below I have order some labs and x-rays today to check on these. I will send you a letter with the results, or call you if we need to make any changes to your current therapies.   1. Constipation:  1. Use enema as you have previously done.  2. Start colace  3. Use Miralax 1-2 times a day to produce soft consistent stools 2. Alcohol Use: I recommend reducing alcohol use to one or fewer glasses a day 3. While we are trying to determine the cause of your symptoms you can take phenergan  4. If you become unable to keep an liquids down or develop severe unrelenting stomach pain   Please bring all your medications to every doctors visit  Sign up for My Chart to have easy access to your labs results, and communication with your Primary care physician.  Next Appointment  Please make an appointment with Dr Erin Hearing in 1-2 weeks for follow-up of your abdominal pain, nausea and vomiting    I look forward to talking with you again at our next visit. If you have any questions or concerns before then, please call the clinic at [phone].  Take Care,   Dr Phill Myron

## 2015-08-22 NOTE — Assessment & Plan Note (Signed)
Diffuse muscle aches and tenderness, cymbalta stopped due to SEs. Worsening pain for the past 3 days.  - discussed other treatment options, given medicaid will try gabapentin, slow titration to minimize sedation - f/u with PCP in 2-3 weeks

## 2015-08-23 ENCOUNTER — Telehealth: Payer: Self-pay | Admitting: Family Medicine

## 2015-08-23 LAB — FERRITIN: FERRITIN: 328 ng/mL — AB (ref 10–291)

## 2015-08-23 LAB — ACUTE HEP PANEL AND HEP B SURFACE AB
HCV AB: NEGATIVE
HEP A IGM: NONREACTIVE
HEP B S AB: POSITIVE — AB
Hep B C IgM: NONREACTIVE
Hepatitis B Surface Ag: NEGATIVE

## 2015-08-23 LAB — IRON AND TIBC
%SAT: 29 % (ref 11–50)
IRON: 99 ug/dL (ref 40–190)
TIBC: 346 ug/dL (ref 250–450)
UIBC: 247 ug/dL (ref 125–400)

## 2015-08-23 LAB — ETHANOL: ALCOHOL ETHYL (B): 11 mg/dL — AB (ref 0–10)

## 2015-08-23 LAB — TSH: TSH: 1.983 u[IU]/mL (ref 0.350–4.500)

## 2015-08-23 NOTE — Telephone Encounter (Signed)
Will forward to MD for results of both labs and imaging. Jazmin Hartsell,CMA

## 2015-08-23 NOTE — Telephone Encounter (Signed)
Covering for Dr. Erin Hearing who is out on vacation. Will forward this to Dr. Berkley Harvey, who saw patient yesterday for these concerns.  Leeanne Rio, MD

## 2015-08-23 NOTE — Telephone Encounter (Signed)
Pt returned call. Also need Blood test results.  [name] [name], ASA

## 2015-08-23 NOTE — Telephone Encounter (Signed)
Pt calling for results.  Phone cut off in middle of conversation. From chart, I assume imaging results. Sadie Reynolds, ASA

## 2015-08-24 NOTE — Telephone Encounter (Signed)
Called and discussed recent blood work and KUB with patient.  KUB was not impressive for constipation , but she has been using enemas and notes improvement in her abdominal pain as well as resolution of her nausea, vomiting.  She has not needed to take Phenergan.  Blood work also revealed positive hepatitis B surface antibody.  She cannot recall if she ever received hepatitis B vaccine.  She has a follow-up appointment with her PCP on 2/8 to discuss if additional test (anti-HBc) are needed. Also recommend repeating LFTs and consider RUQ [place].

## 2015-09-05 ENCOUNTER — Ambulatory Visit: Payer: Self-pay | Admitting: Family Medicine

## 2015-11-29 ENCOUNTER — Encounter: Payer: Self-pay | Admitting: Family Medicine

## 2015-11-29 ENCOUNTER — Ambulatory Visit (INDEPENDENT_AMBULATORY_CARE_PROVIDER_SITE_OTHER): Payer: Medicaid Other | Admitting: Family Medicine

## 2015-11-29 VITALS — BP 153/106 | HR 114 | Temp 98.4°F | Ht <= 58 in | Wt 175.0 lb

## 2015-11-29 DIAGNOSIS — J0110 Acute frontal sinusitis, unspecified: Secondary | ICD-10-CM

## 2015-11-29 DIAGNOSIS — J4540 Moderate persistent asthma, uncomplicated: Secondary | ICD-10-CM

## 2015-11-29 DIAGNOSIS — Z3009 Encounter for other general counseling and advice on contraception: Secondary | ICD-10-CM | POA: Diagnosis not present

## 2015-11-29 DIAGNOSIS — Z30013 Encounter for initial prescription of injectable contraceptive: Secondary | ICD-10-CM | POA: Diagnosis not present

## 2015-11-29 MED ORDER — AMOXICILLIN-POT CLAVULANATE 875-125 MG PO TABS: 1.0000 | ORAL_TABLET | Freq: Two times a day (BID) | ORAL | Status: DC

## 2015-11-29 NOTE — Patient Instructions (Signed)
Thank you for coming in to clinic today.  1. It sounds like you have a Sinusitis (Bacterial Infection) - this most likely started as an Upper Respiratory Virus that has settled into an infection. Allergies can also cause this. - Start Augmentin 1 pill twice daily (breakfast and dinner, with food and plenty of water) for 10 days, complete entire course, do not stop early even if feeling better - Recommend to start keep using Nasal Saline spray multiple times a day to help flush out congestion and clear sinuses - Improve hydration by drinking plenty of clear fluids (water, gatorade) to reduce secretions and thin congestion - May start a Nasonex steroid nasal spray 1 spray in each nostril each day for next 2 to 4 weeks, may need for longer - Congestion draining down throat can cause irritation. May try warm herbal tea with honey, cough drops - Can take Tylenol or Ibuprofen as needed for fevers - If you want to try an OTC cough medicine, you may try Mucinex for up to 1 week (twice daily)  If you develop significant worsening fever >101F for at least 3 consecutive days, difficulty breathing or wheezing, please schedule a follow-up evaluation within next few days to week. Or go to ED as needed.  Please schedule a follow-up appointment with Dr Erin Hearing within 2 weeks if not improved, Sinusitis, otherwise may follow-up within 1 month for Fibromyalgia  If you have any other questions or concerns, please feel free to call the clinic to contact me. You may also schedule an earlier appointment if necessary.  However, if your symptoms get significantly worse, please go to the Emergency Department to seek immediate medical attention.  [name] [name], [place]

## 2015-11-29 NOTE — Assessment & Plan Note (Addendum)
Consistent with subacute sinusitis R-frontal, likely initially viral URI with improvement now worsening sinus congestion, concern for possible bacterial infection, despite afebrile.  Plan: 1. Discussion on risks/benefits of antibiotics, given worsening, mutual decision to start Augmentin 875-125mg  PO BID x 10 days 2. Trial OTC nasonex, may use anti-histamine 3. Supportive care with nasal saline OTC, hydration 4. Continue albuterol PRN 5. Return criteria reviewed

## 2015-11-29 NOTE — Progress Notes (Signed)
Subjective:    Patient ID: [name] [name], female    DOB: 1975/11/13, 40 y.o.   MRN: [id number]  [name] [name] is a 40 y.o. female presenting on 11/29/2015 for Shortness of Breath   Patient presents for a same day appointment.  HPI  SINUSITIS / DYSPNEA / URI / H/o ASTHMA: - Reports symptoms started about 2 weeks ago with URI symptoms following exposure to sick contact (daughter) at same time, daughter was only sick for 24-48 hours with URI then resolved. Patient describes symptoms with cough and sinus/chest congestion, describes cough as productive with mucus, associated with sinus pressure and headache, no fever, had intermittent episode of nausea without vomiting. However symptoms improved over a week then acutely returned with difficulty breathing and coughing again, worsening over past few days. - Chronic history of asthma, feels like she is wheezing at night, using Albuterol inhaler 2-4 times a day for past 2 days with mild relief - History of congenital cerebral palsy, with chronic congenital R-hearing loss - History fibromyalgia, describes some increased aching in arms and legs, recent flare up 2 days ago with respiratory illness, takes Gabapentin for this - Admits headache - Denies any fevers/chills, sweats, rash, chest pain/pressure, pre-syncope/syncope, dizziness/vertigo, nasal purulence   Social History  Substance Use Topics  . Smoking status: Never Smoker   . Smokeless tobacco: None  . Alcohol Use: 0.6 oz/week    1 Glasses of wine per week     Comment: 1 glass wine per day until known pregnancy at approx 12 weeks    Review of Systems Per HPI unless specifically indicated above     Objective:    BP 153/106 mmHg  Pulse 114  Temp(Src) 98.4 F (36.9 C) (Oral)  Ht 4\' 10"  (1.473 m)  Wt 175 lb (79.379 kg)  BMI 36.58 kg/m2  LMP 11/12/2015  Wt Readings from Last 3 Encounters:  11/29/15 175 lb (79.379 kg)  08/22/15 173 lb 6.4 oz (78.654 kg)  08/20/15 174 lb (78.926  kg)    Physical Exam  Constitutional: She appears well-developed and well-nourished. No distress.  Currently ill appearing but non-toxic, comfortable and cooperative  HENT:  Head: Normocephalic and atraumatic.  Mouth/Throat: Oropharynx is clear and moist.  Left frontal sinus tenderness to palpation. R- minimally tender. Bilateral maxillary sinuses non-tender. No dental pain on percussion. Oropharynx is clear moist mucus mem, without asymmetry. TM's clear bilaterally normal landmarks no erythema no bulging.  Eyes: Conjunctivae are normal.  Neck: Normal range of motion. Neck supple.  Cardiovascular: Regular rhythm, normal heart sounds and intact distal pulses.   No murmur heard. Tachycardic  Pulmonary/Chest: Effort normal and breath sounds normal. No respiratory distress. She has no wheezes. She has no rales. She exhibits no tenderness.  Speaks full sentences.  Musculoskeletal: She exhibits no edema.  Lymphadenopathy:    She has no cervical adenopathy.  Neurological: She is alert.  Skin: Skin is warm and dry. No rash noted. She is not diaphoretic.  Nursing note and vitals reviewed.      Assessment & Plan:   Problem List Items Addressed This Visit    Sinusitis, acute frontal - Primary    Consistent with subacute sinusitis R-frontal, likely initially viral URI with improvement now worsening sinus congestion, concern for possible bacterial infection, despite afebrile.  Plan: 1. Discussion on risks/benefits of antibiotics, given worsening, mutual decision to start Augmentin 875-125mg  PO BID x 10 days 2. Trial OTC nasonex, may use anti-histamine 3. Supportive care with nasal saline OTC, hydration  4. Continue albuterol PRN 5. Return criteria reviewed      Relevant Medications   amoxicillin-clavulanate (AUGMENTIN) 875-125 MG tablet   Asthma    Stable, without acute exacerbation today. Mild tachycardic recent albuterol use. No resp distress or wheezing on exam, lungs clear. Suspect  some dyspnea and wheezing at night in setting of URI / now sinusitis triggering mild asthma. - Discussed options with oral steroids, however suspect sinusitis is underlying problem and will treat this first - Continue albuterol, singulair - Strict return precautions given         Meds ordered this encounter  Medications  .           . amoxicillin-clavulanate (AUGMENTIN) 875-125 MG tablet    Sig: Take 1 tablet by mouth 2 (two) times daily.    Dispense:  20 tablet    Refill:  0      Follow up plan: Return in about 2 weeks (around 12/13/2015), or if symptoms worsen or fail to improve, for sinusitis.  Nobie Putnam, [place], PGY-3

## 2015-11-29 NOTE — Assessment & Plan Note (Addendum)
Stable, without acute exacerbation today. Mild tachycardic recent albuterol use. No resp distress or wheezing on exam, lungs clear. Suspect some dyspnea and wheezing at night in setting of URI / now sinusitis triggering mild asthma. - Discussed options with oral steroids, however suspect sinusitis is underlying problem and will treat this first - Continue albuterol, singulair - Strict return precautions given

## 2016-01-07 ENCOUNTER — Encounter: Payer: Self-pay | Admitting: Family Medicine

## 2016-01-07 ENCOUNTER — Ambulatory Visit (INDEPENDENT_AMBULATORY_CARE_PROVIDER_SITE_OTHER): Payer: Medicaid Other | Admitting: Family Medicine

## 2016-01-07 VITALS — BP 153/95 | HR 109 | Temp 98.4°F | Ht <= 58 in | Wt 179.4 lb

## 2016-01-07 DIAGNOSIS — M797 Fibromyalgia: Secondary | ICD-10-CM | POA: Diagnosis not present

## 2016-01-07 DIAGNOSIS — Z3009 Encounter for other general counseling and advice on contraception: Secondary | ICD-10-CM | POA: Diagnosis not present

## 2016-01-07 DIAGNOSIS — M791 Myalgia, unspecified site: Secondary | ICD-10-CM

## 2016-01-07 DIAGNOSIS — R740 Nonspecific elevation of levels of transaminase and lactic acid dehydrogenase [LDH]: Secondary | ICD-10-CM | POA: Diagnosis not present

## 2016-01-07 DIAGNOSIS — R7401 Elevation of levels of liver transaminase levels: Secondary | ICD-10-CM

## 2016-01-07 DIAGNOSIS — J4540 Moderate persistent asthma, uncomplicated: Secondary | ICD-10-CM | POA: Diagnosis not present

## 2016-01-07 DIAGNOSIS — Z30013 Encounter for initial prescription of injectable contraceptive: Secondary | ICD-10-CM | POA: Diagnosis not present

## 2016-01-07 MED ORDER — BACLOFEN 10 MG PO TABS: 5.0000 mg | ORAL_TABLET | Freq: Three times a day (TID) | ORAL | Status: DC | PRN

## 2016-01-07 NOTE — Patient Instructions (Signed)
Sent in baclofen for you. This is a muscle relaxer Use caution as it might make you sleepy  Checking liver, kidneys, muscle enzyme test today  Follow up with Dr. Erin Hearing in a few weeks for chronic medical problems  Be well, Dr. Ardelia Mems

## 2016-01-07 NOTE — Progress Notes (Signed)
Date of Visit: 01/07/2016   HPI:  Patient presents for a same day appointment to discuss pain in legs.  Reports history of fibromyalgia that flares from time to time. Takes gabapentin 200mg  twice daily for her usual aches and pains. Has had increased pain this week in arms and legs. No joint pains, more in her muscles. No fevers or preceding injuries. Stooling and urinating normally. Eating and drinking well. Thinks this was instigated by a coworker yelling at her earlier this week - it was very upsetting.   Also of note - had LFT's checked in January that were elevated (AST 432, ALT 190). Had follow up testing for iron studies and hepatitis, which were unremarkable. Was supposed to follow up with PCP but has yet to see PCP for this. No subsequent checks of LFTs since January.  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 Speech normal. Alert & oriented, follows commands. Extremities: mild tenderness of muscles in all 4 extremities.  ASSESSMENT/PLAN:  1. Fibromyalgia flare - likely triggered by emotional upset from negative interaction with coworker earlier this week. Well appearing. Will treat with muscle relaxer (baclofen). Given the complaint of muscle tenderness will also check CK with labs today. Follow up with PCP in a few weeks for chronic medical issues.  2. Transaminitis - repeat CMET today to ensure LFTs improved. Follow up with PCP.   FOLLOW UP: Follow up in several weeks with PCP for above  issues  [place] J. Ardelia Mems, [place]

## 2016-01-08 ENCOUNTER — Encounter: Payer: Self-pay | Admitting: Family Medicine

## 2016-01-08 LAB — COMPLETE METABOLIC PANEL WITH GFR
ALT: 49 U/L — AB (ref 6–29)
AST: 116 U/L — AB (ref 10–30)
Albumin: 3.7 g/dL (ref 3.6–5.1)
Alkaline Phosphatase: 83 U/L (ref 33–115)
BUN: 9 mg/dL (ref 7–25)
CHLORIDE: 103 mmol/L (ref 98–110)
CO2: 20 mmol/L (ref 20–31)
CREATININE: 0.79 mg/dL (ref 0.50–1.10)
Calcium: 8.9 mg/dL (ref 8.6–10.2)
GFR, Est African American: >89 mL/min (ref >=60)
GFR, Est Non African American: >89 mL/min (ref >=60)
GLUCOSE: 80 mg/dL (ref 65–99)
POTASSIUM: 4.1 mmol/L (ref 3.5–5.3)
SODIUM: 137 mmol/L (ref 135–146)
Total Bilirubin: 0.3 mg/dL (ref 0.2–1.2)
Total Protein: 6.9 g/dL (ref 6.1–8.1)

## 2016-01-08 LAB — CK: Total CK: 158 U/L (ref 7–177)

## 2016-01-25 ENCOUNTER — Ambulatory Visit: Payer: Self-pay | Admitting: Family Medicine

## 2016-01-28 ENCOUNTER — Ambulatory Visit (INDEPENDENT_AMBULATORY_CARE_PROVIDER_SITE_OTHER): Payer: Medicaid Other | Admitting: Family Medicine

## 2016-01-28 VITALS — BP 145/111 | HR 7 | Temp 98.6°F | Ht <= 58 in | Wt 180.0 lb

## 2016-01-28 DIAGNOSIS — M797 Fibromyalgia: Secondary | ICD-10-CM

## 2016-01-28 DIAGNOSIS — Z30013 Encounter for initial prescription of injectable contraceptive: Secondary | ICD-10-CM | POA: Diagnosis not present

## 2016-01-28 DIAGNOSIS — J4540 Moderate persistent asthma, uncomplicated: Secondary | ICD-10-CM | POA: Diagnosis not present

## 2016-01-28 DIAGNOSIS — Z3009 Encounter for other general counseling and advice on contraception: Secondary | ICD-10-CM | POA: Diagnosis not present

## 2016-01-28 MED ORDER — KETOROLAC TROMETHAMINE 30 MG/ML IJ SOLN
30.0000 mg | Freq: Once | INTRAMUSCULAR | Status: AC
  Administered 2016-01-28: 30 mg via INTRAMUSCULAR

## 2016-01-28 MED ORDER — GABAPENTIN 100 MG PO CAPS: ORAL_CAPSULE | ORAL | Status: DC

## 2016-01-28 MED ORDER — METHYLPREDNISOLONE ACETATE 80 MG/ML IJ SUSP
80.0000 mg | Freq: Once | INTRAMUSCULAR | Status: AC
  Administered 2016-01-28: 80 mg via INTRAMUSCULAR

## 2016-01-28 NOTE — Patient Instructions (Signed)
Thank you so much for coming to visit today! I have refilled your Gabapentin. Please continue to use this and Baclofen. You may try Melatonin to help you sleep at night. We will give you a shot of Toradol and steroids today. A work note was given through 02/01/16. Please return if your symptoms do not improve or worsen.  Thanks again! Dr. Gerlean Ren

## 2016-01-30 NOTE — Assessment & Plan Note (Signed)
-   Suspect Fibromyalgia flare, however difficult to say if back pain is from fibromyalgia or musculoskeletal due to move - Discussed importance of sleep with Fibromyalgia. Sleep hygiene discussed. To consider melatonin if she is nervous about Baclofen at night. - Continue Gabapentin, Baclofen - Will give injection of Depomedrol and Toradol here - Note work given - Follow up with PCP as scheduled on 7/26

## 2016-01-30 NOTE — Progress Notes (Signed)
Subjective:     Patient ID: [name] [name], female   DOB: 1976/07/11, 40 y.o.   MRN: [id number]  HPI [name] [name] is a 40yo female presenting today for fibromyalgia flare. - Recently seen on 01/07/16 for same, exacerbated by coworker yelling at her. Prescribed Baclofen and Gabapentin. - This episode stated Wednesday 6/28 and has been getting worse - Has been moving over the last week, which has been stressful. Has not been sleeping well, only about 4hr/night - Pain is located in upper arms and legs and in lower back. Reports while the arms and legs are common locations for her flare, back pain is rare. - Baclofen and Gabapentin help. Only takes Baclofen at night and only when her husband is home. He has not been home the last several nights and she is worried about it making her too sleepy to hear her young daughter cry at night. - States moving and working has been too much for her fibromyalgia. Requests time off of work. - Denies fever, chest pain, shortness of breath - Never Smoker  Review of Systems Per HPI. Other systems negative.    Objective:   Physical Exam  Constitutional: She appears well-developed and well-nourished. No distress.  Cardiovascular: Normal rate and regular rhythm.  Exam reveals no gallop and no friction rub.   No murmur heard. Pulmonary/Chest: Effort normal. No respiratory distress. She has no wheezes.  Musculoskeletal: She exhibits no edema.  Negative Homan's. No calf tenderness. No midline spinal tenderness.  Psychiatric: She has a normal mood and affect. Her behavior is normal.      Assessment and Plan:     Fibromyalgia - Suspect Fibromyalgia flare, however difficult to say if back pain is from fibromyalgia or musculoskeletal due to move - Discussed importance of sleep with Fibromyalgia. Sleep hygiene discussed. To consider melatonin if she is nervous about Baclofen at night. - Continue Gabapentin, Baclofen - Will give injection of Depomedrol and Toradol  here - Note work given - Follow up with PCP as scheduled on 7/26

## 2016-02-07 ENCOUNTER — Telehealth: Payer: Self-pay | Admitting: Family Medicine

## 2016-02-07 NOTE — Telephone Encounter (Signed)
Called asked her to come in at 60 on 7-26 she agrees

## 2016-02-20 ENCOUNTER — Ambulatory Visit (INDEPENDENT_AMBULATORY_CARE_PROVIDER_SITE_OTHER): Payer: Medicaid Other | Admitting: Family Medicine

## 2016-02-20 ENCOUNTER — Encounter: Payer: Self-pay | Admitting: Family Medicine

## 2016-02-20 DIAGNOSIS — Z3009 Encounter for other general counseling and advice on contraception: Secondary | ICD-10-CM | POA: Diagnosis present

## 2016-02-20 DIAGNOSIS — R740 Nonspecific elevation of levels of transaminase and lactic acid dehydrogenase [LDH]: Secondary | ICD-10-CM | POA: Diagnosis not present

## 2016-02-20 DIAGNOSIS — M797 Fibromyalgia: Secondary | ICD-10-CM | POA: Diagnosis not present

## 2016-02-20 DIAGNOSIS — F32A Depression, unspecified: Secondary | ICD-10-CM

## 2016-02-20 DIAGNOSIS — F329 Major depressive disorder, single episode, unspecified: Secondary | ICD-10-CM

## 2016-02-20 DIAGNOSIS — J4540 Moderate persistent asthma, uncomplicated: Secondary | ICD-10-CM | POA: Diagnosis not present

## 2016-02-20 DIAGNOSIS — I10 Essential (primary) hypertension: Secondary | ICD-10-CM | POA: Diagnosis not present

## 2016-02-20 DIAGNOSIS — Z30013 Encounter for initial prescription of injectable contraceptive: Secondary | ICD-10-CM | POA: Diagnosis not present

## 2016-02-20 DIAGNOSIS — R748 Abnormal levels of other serum enzymes: Secondary | ICD-10-CM

## 2016-02-20 MED ORDER — GABAPENTIN 100 MG PO CAPS: ORAL_CAPSULE | ORAL | 0 refills | Status: DC

## 2016-02-20 MED ORDER — AMLODIPINE BESYLATE 10 MG PO TABS: 10.0000 mg | ORAL_TABLET | Freq: Every day | ORAL | 0 refills | Status: DC

## 2016-02-20 NOTE — Patient Instructions (Addendum)
Good to see you today!  Thanks for coming in. [name] [name] in [place]  For your blood pressure - start amllodipine 10 mg every day.  The main side effect is occsl swelling of legs if you get this then stop and see a local doctor.  Your goal blood pressure is < 140/90  For your liver tests which were elevated See a local doctor to have these rechecked They are most likely due to the alcohol.  You should stop drinking completely and avoid large doses of tylenol   For the Fibromyalgia - continue the gabapentin.  It will likely be worse during stress.  Exercise like walking can help  If your mood is getting very depressed especially any thoughts of hurting yourself then see someone right away  Asthma - continue the flovent daily and as needed albuterol

## 2016-02-20 NOTE — Assessment & Plan Note (Signed)
Clinically stable.  Recommend have checked when she moves to [place] next week.   Advised stopping alcohol.  Her father is a non drinker

## 2016-02-20 NOTE — Assessment & Plan Note (Signed)
BP Readings from Last 3 Encounters:  02/20/16 (!) 166/116  01/28/16 (!) 145/111  01/07/16 (!) 153/95    Given is consistently high will start amlodipine and ask her to follow up with MD in [place]

## 2016-02-20 NOTE — Assessment & Plan Note (Signed)
Stable Continue gabapentin 

## 2016-02-20 NOTE — Progress Notes (Signed)
Subjective  Patient is presenting with the following illnesses  Fibromyalgia  - has been having a lot of pain all over.  Thinks is related to her increased stress with just leaving her boyfriend and is moving to [place] to stay with her father.  Using gabapentin which helps.  No joint swelling or redness or fever  Elevated LFTs This was noticed on prior CMET.  Was improving last check.  No abdomen pain or nausea and vomiting or jaundice.  Is still drinking etoh but has stopped all tylenol.  HYPERTENSION Disease Monitoring  Home BP Monitoring (Severity) not checking but has been high at other visits Symptoms - Chest pain- no    Dyspnea- no Medications (Modifying factors) Compliance-  Not on any meds. Lightheadedness-  no  Edema- mild, gets better in AM Timing - continuous  Duration - years ROS - See HPI  Asthma - not much trouble breathing lately.  Using albuterl rarely.  Using flovent daily.   No nighttime cough    PMH Lab Review   Potassium  Date Value Ref Range Status  01/07/2016 4.1 3.5 - 5.3 mmol/L Final   Sodium  Date Value Ref Range Status  01/07/2016 137 135 - 146 mmol/L Final   Creat  Date Value Ref Range Status  01/07/2016 0.79 0.50 - 1.10 mg/dL Final          Chief Complaint noted Review of Symptoms - see HPI PMH - Smoking status noted.     Objective Vital Signs reviewed Psych:  Cognition and judgment appear intact. Alert, communicative  and cooperative with normal attention span and concentration. No apparent delusions, illusions, hallucinations Able to get up on exam table but slowly due to her CP Heart - Regular rate and rhythm.  No murmurs, gallops or rubs.    Lungs:  Normal respiratory effort, chest expands symmetrically. Lungs are clear to auscultation, no crackles or wheezes. No edema on exam     Assessments/Plans  See Encounter view if individual problem A/Ps not visible See after visit summary for details of patient instuctions

## 2016-02-20 NOTE — Assessment & Plan Note (Signed)
Worsened partially due to current stress with leaving her boyfriend and mving to [place].  Suggest seeing counselor and abstaining from etoh

## 2016-03-18 ENCOUNTER — Other Ambulatory Visit: Payer: Self-pay | Admitting: Family Medicine

## 2016-03-18 DIAGNOSIS — J4540 Moderate persistent asthma, uncomplicated: Secondary | ICD-10-CM

## 2016-03-21 ENCOUNTER — Telehealth: Payer: Self-pay | Admitting: *Deleted

## 2016-03-21 NOTE — Telephone Encounter (Signed)
Prior Authorization received from [facility] pharmacy for Flovent [facility] Inhaler. Formulary and PA form placed in provider box for completion. Derl Barrow, RN

## 2016-03-24 ENCOUNTER — Other Ambulatory Visit: Payer: Self-pay | Admitting: Family Medicine

## 2016-03-24 MED ORDER — BECLOMETHASONE DIPROPIONATE 40 MCG/ACT IN AERS: 2.0000 | INHALATION_SPRAY | Freq: Two times a day (BID) | RESPIRATORY_TRACT | 12 refills | Status: DC

## 2016-03-24 NOTE — Progress Notes (Deleted)
qvarSubjective  Patient is presenting with the following illnesses     Chief Complaint noted Review of Symptoms - see HPI PMH - Smoking status noted.     Objective Vital Signs reviewed     Assessments/Plans  No problem-specific Assessment & Plan notes found for this encounter.   See Encounter view if individual problem A/Ps not visible See after visit summary for details of patient instuctions

## 2016-03-24 NOTE — Telephone Encounter (Signed)
Prescribed Qvar which is approved  Did not see this in her prior medication lists

## 2016-11-11 ENCOUNTER — Ambulatory Visit (INDEPENDENT_AMBULATORY_CARE_PROVIDER_SITE_OTHER): Payer: Medicaid Other | Admitting: Obstetrics and Gynecology

## 2016-11-11 ENCOUNTER — Encounter (HOSPITAL_COMMUNITY): Payer: Self-pay | Admitting: Emergency Medicine

## 2016-11-11 ENCOUNTER — Emergency Department (HOSPITAL_COMMUNITY)
Admission: EM | Admit: 2016-11-11 | Discharge: 2016-11-11 | Disposition: A | Discharge status: Home / Self Care | Payer: Medicaid Other | Attending: Emergency Medicine | Admitting: Emergency Medicine

## 2016-11-11 VITALS — BP 150/80 | HR 100 | Temp 98.3°F | Wt 183.0 lb

## 2016-11-11 DIAGNOSIS — J4521 Mild intermittent asthma with (acute) exacerbation: Secondary | ICD-10-CM | POA: Diagnosis present

## 2016-11-11 DIAGNOSIS — R062 Wheezing: Secondary | ICD-10-CM | POA: Diagnosis not present

## 2016-11-11 DIAGNOSIS — J45909 Unspecified asthma, uncomplicated: Secondary | ICD-10-CM | POA: Diagnosis not present

## 2016-11-11 DIAGNOSIS — Z79899 Other long term (current) drug therapy: Secondary | ICD-10-CM | POA: Diagnosis not present

## 2016-11-11 DIAGNOSIS — I10 Essential (primary) hypertension: Secondary | ICD-10-CM

## 2016-11-11 DIAGNOSIS — Z5321 Procedure and treatment not carried out due to patient leaving prior to being seen by health care provider: Secondary | ICD-10-CM | POA: Insufficient documentation

## 2016-11-11 HISTORY — DX: Cerebral palsy, unspecified: G80.9

## 2016-11-11 MED ORDER — ALBUTEROL SULFATE (2.5 MG/3ML) 0.083% IN NEBU
5.0000 mg | INHALATION_SOLUTION | Freq: Once | RESPIRATORY_TRACT | Status: AC
  Administered 2016-11-11: 5 mg via RESPIRATORY_TRACT
  Filled 2016-11-11: qty 6

## 2016-11-11 MED ORDER — ALBUTEROL SULFATE (2.5 MG/3ML) 0.083% IN NEBU
INHALATION_SOLUTION | RESPIRATORY_TRACT | Status: AC
  Filled 2016-11-11: qty 3

## 2016-11-11 MED ORDER — LORATADINE 10 MG PO TABS: 10.0000 mg | ORAL_TABLET | Freq: Every day | ORAL | 11 refills | Status: DC

## 2016-11-11 MED ORDER — ALBUTEROL SULFATE HFA 108 (90 BASE) MCG/ACT IN AERS: INHALATION_SPRAY | RESPIRATORY_TRACT | 2 refills | Status: DC

## 2016-11-11 MED ORDER — ALBUTEROL SULFATE (2.5 MG/3ML) 0.083% IN NEBU
2.5000 mg | INHALATION_SOLUTION | Freq: Once | RESPIRATORY_TRACT | Status: AC
Start: 2016-11-11 — End: 2016-11-11
  Administered 2016-11-11: 2.5 mg via RESPIRATORY_TRACT

## 2016-11-11 MED ORDER — IPRATROPIUM BROMIDE 0.02 % IN SOLN
0.5000 mg | Freq: Once | RESPIRATORY_TRACT | Status: AC
  Administered 2016-11-11: 0.5 mg via RESPIRATORY_TRACT

## 2016-11-11 MED ORDER — FLUTICASONE PROPIONATE HFA 44 MCG/ACT IN AERO: 2.0000 | INHALATION_SPRAY | Freq: Two times a day (BID) | RESPIRATORY_TRACT | 12 refills | Status: DC

## 2016-11-11 MED ORDER — ALBUTEROL SULFATE (2.5 MG/3ML) 0.083% IN NEBU
5.0000 mg | INHALATION_SOLUTION | Freq: Once | RESPIRATORY_TRACT | Status: AC
  Administered 2016-11-11: 5 mg via RESPIRATORY_TRACT

## 2016-11-11 MED ORDER — PREDNISONE 20 MG PO TABS
20.0000 mg | ORAL_TABLET | Freq: Every day | ORAL | 0 refills | Status: DC
Start: 2016-11-11 — End: 2016-12-03

## 2016-11-11 NOTE — ED Triage Notes (Signed)
Pt reports having wheezing that started around 2100 and took otc benadryl with no relief. Pt reports that she does not have any inhalers.

## 2016-11-11 NOTE — Progress Notes (Signed)
   Subjective:   Patient ID: [name] [name], female    DOB: [birth year], 41 y.o.   MRN: [id number]  Patient presents for Same Day Appointment  Chief Complaint  Patient presents with  . Asthma    HPI: # Asthma Asthma flare up yesterday Went to ED and received 2 nebulizer treatments Felt better and left before seeing provider States that with the start of allergy seasons her asthma has been exacerbated Symptoms started about 2 days ago but got worse yesterday. symptoms: dyspnea, wheezing, hard to catch breath, chest tightness Unemployed so unable to afford medications Not taking her medications due to being unable to afford - not taking albuterol or qvar Taking benadryl for 3 days but not much relief  Symptoms Fever: no Sputum: no Wheezing or asthma: yes  Review of Systems   See HPI for ROS.   History  Smoking Status  . Never Smoker  Smokeless Tobacco  . Never Used    Past medical history, surgical, family, and social history reviewed and updated in the EMR as appropriate.   Objective:  BP (!) 150/80   Pulse 100   Temp 98.3 F (36.8 C) (Oral)   Wt 183 lb (83 kg)   LMP 10/27/2016   SpO2 99%   BMI 39.60 kg/m  Vitals and nursing note reviewed  Physical Exam  Constitutional: She is well-developed, well-nourished, and in no distress.  HENT:  Mouth/Throat: Oropharynx is clear and moist.  Eyes: Conjunctivae and EOM are normal.  Neck: Normal range of motion. Neck supple.  Cardiovascular: Normal rate, regular rhythm and normal heart sounds.   Pulmonary/Chest: Effort normal. She has decreased breath sounds. She has wheezes. She has no rales.  Psychiatric: Mood and affect normal.    Assessment & Plan:  Please see separate assessment and plan  Diagnosis and plan along with any newly prescribed medication(s) were discussed in detail with this patient today. The patient verbalized understanding and agreed with the plan. Patient advised if symptoms worsen return to  clinic or ER.   PATIENT EDUCATION PROVIDED: See AVS   Luiz Blare, DO 11/11/2016, 3:45 PM PGY-3, [place]

## 2016-11-11 NOTE — Patient Instructions (Signed)
Bronchospasm, Adult Bronchospasm is when airways in the lungs get smaller. When this happens, it can be hard to breathe. You may cough. You may also make a whistling sound when you breathe (wheeze). Follow these instructions at home: Medicines  Take over-the-counter and prescription medicines only as told by your doctor.  If you need to use an inhaler or nebulizer to take your medicine, ask your doctor how to use it.  If you were given a spacer, always use it with your inhaler. Lifestyle  Change your heating and air conditioning filter. Do this at least once a month.  Try not to use fireplaces and wood stoves.  Do not  smoke. Do not  allow smoking in your home.  Try not to use things that have a strong smell, like perfume.  Get rid of pests (such as roaches and mice) and their poop.  Remove any mold from your home.  Keep your house clean. Get rid of dust.  Use cleaning products that have no smell.  Replace carpet with wood, tile, or vinyl flooring.  Use allergy-proof pillows, mattress covers, and box spring covers.  Wash bed sheets and blankets every week. Use hot water. Dry them in a dryer.  Use blankets that are made of polyester or cotton.  Wash your hands often.  Keep pets out of your bedroom.  When you exercise, try not to breathe in cold air. General instructions  Have a plan for getting medical care. Know these things: ? When to call your doctor. ? When to call local emergency services (911 in the [place] ? Where to go in an emergency.  Stay up to date on your shots (immunizations).  When you have an episode: ? Stay calm. ? Relax. ? Breathe slowly. Contact a doctor if:  Your muscles ache.  Your chest hurts.  The color of the mucus you cough up (sputum) changes from clear or white to yellow, green, gray, or bloody.  The mucus you cough up gets thicker.  You have a fever. Get help right away if:  The whistling sound gets worse, even after you  take your medicines.  Your coughing gets worse.  You find it even harder to breathe.  Your chest hurts very much. Summary  Bronchospasm is when airways in the lungs get smaller.  When this happens, it can be hard to breathe. You may cough. You may also make a whistling sound when you breathe.  Stay away from things that cause you to have episodes. These include smoke or dust. This information is not intended to replace advice given to you by your health care provider. Make sure you discuss any questions you have with your health care provider. Document Released: 05/11/2009 Document Revised: 07/17/2016 Document Reviewed: 07/17/2016 Elsevier Interactive Patient Education  2017 [employer]  

## 2016-11-11 NOTE — ED Notes (Signed)
Pt stated she was feeling better and she will just follow up with her PCP. Pt left

## 2016-11-13 ENCOUNTER — Encounter: Payer: Self-pay | Admitting: Obstetrics and Gynecology

## 2016-11-13 NOTE — Assessment & Plan Note (Signed)
BP noted to be elevated on exam today. Due to acute asthma exacerbation will hold off on treating at this time. Patient to continue amlodipine as prescribed. Patient to contact office if she is unable to afford her medications. Follow-up with PCP for further adjustments.

## 2016-11-13 NOTE — Assessment & Plan Note (Signed)
With acute exacerbation today. Patient with diffuse wheezing but normal respiratory effort. Oxygen saturation normal. Vitals stable. Given breathing treatment in clinic. Believe seasonal allergies trigger for acute asthma exacerbation along with medication noncompliance. Rx sent to pharmacy for Flovent and albuterol. Discontinue Qvar due to insurance changes. Patient believes she will be able to pick up medications for them.  Rx also given for Claritin for allergy control. Will also give patient Dosepak of prednisone. Warning signs and return precautions given.

## 2016-11-14 ENCOUNTER — Ambulatory Visit: Payer: Medicaid Other | Admitting: Internal Medicine

## 2016-12-03 ENCOUNTER — Encounter: Payer: Self-pay | Admitting: Family Medicine

## 2016-12-03 ENCOUNTER — Ambulatory Visit (INDEPENDENT_AMBULATORY_CARE_PROVIDER_SITE_OTHER): Payer: Medicaid Other | Admitting: Family Medicine

## 2016-12-03 DIAGNOSIS — J4520 Mild intermittent asthma, uncomplicated: Secondary | ICD-10-CM | POA: Diagnosis not present

## 2016-12-03 DIAGNOSIS — R2231 Localized swelling, mass and lump, right upper limb: Secondary | ICD-10-CM | POA: Diagnosis present

## 2016-12-03 DIAGNOSIS — I10 Essential (primary) hypertension: Secondary | ICD-10-CM

## 2016-12-03 DIAGNOSIS — R2230 Localized swelling, mass and lump, unspecified upper limb: Secondary | ICD-10-CM | POA: Insufficient documentation

## 2016-12-03 NOTE — Progress Notes (Signed)
Subjective  Patient is presenting with the following illnesses  Asthma Recent exacerbation seen in ER.  Feeling better.  Some cough at night.  No fever or sputum or leg swelling Not sure how oftern using Flovent.  Using albuterol almost daily  Rhinitis Not taking clariten (ran out) Some runny nose and itchy eyes  HYPERTENSION Disease Monitoring  Home BP Monitoring (Severity) not checking Symptoms - Chest pain- no    Dyspnea- see above Medications (Modifying factors) Compliance-  Out of amlodipne for a week. Lightheadedness-  no  Edema- no Timing - continuous  Duration - years ROS - See HPI  PMH Lab Review   Potassium  Date Value Ref Range Status  01/07/2016 4.1 3.5 - 5.3 mmol/L Final   Sodium  Date Value Ref Range Status  01/07/2016 137 135 - 146 mmol/L Final   Creat  Date Value Ref Range Status  01/07/2016 0.79 0.50 - 1.10 mg/dL Final          Chief Complaint noted Review of Symptoms - see HPI PMH - Smoking status noted.     Objective Vital Signs reviewed Alert nad Lungs:  Normal respiratory effort, chest expands symmetrically. Lungs are clear to auscultation, no crackles or wheezes. Extremities:  No cyanosis, edema, or deformity noted with good range of motion of all major joints.   Heart - Regular rate and rhythm.  No murmurs, gallops or rubs.     R axilla - poorly defined soft tissue fullness in lower axilla soft nontender not red -seems like lipoma.  Measures about 4 x4 cm but is poorly defined    Assessments/Plans  No problem-specific Assessment & Plan notes found for this encounter.   See Encounter view if individual problem A/Ps not visible See after visit summary for details of patient instuctions

## 2016-12-03 NOTE — Assessment & Plan Note (Signed)
Not at goal.  Encourage to take her medications

## 2016-12-03 NOTE — Patient Instructions (Signed)
Good to see you today!  Thanks for coming in.  For your blood pressure - take the amlodipine every day the same time  Check your blood pressure at the drug store several times a week and write down  Take your asthma controlled - Flovent 2 p twice a day every day   Bring all your medications   Come back in one month

## 2016-12-03 NOTE — Assessment & Plan Note (Addendum)
Seems consistent with  Lipoma.   Measures about 4 x4 cm but is poorly defined.   Monitor

## 2016-12-03 NOTE — Assessment & Plan Note (Signed)
Worsened.  See after visit summary  

## 2017-01-09 ENCOUNTER — Telehealth: Payer: Self-pay | Admitting: Family Medicine

## 2017-01-09 NOTE — Telephone Encounter (Signed)
Pt needs a referral to [facility] OB/[facility] for uterine fibroids. ep

## 2017-01-12 NOTE — Telephone Encounter (Signed)
Will forward to MD and referral coordinator. Jazmin Hartsell,CMA  

## 2017-01-13 NOTE — Telephone Encounter (Signed)
Patient needs follow up office visit to make sure blood pressure is controlled  before can refer  Thanks

## 2017-01-13 NOTE — Telephone Encounter (Signed)
Patient scheduled for tomorrow with PCP.

## 2017-01-14 ENCOUNTER — Ambulatory Visit (INDEPENDENT_AMBULATORY_CARE_PROVIDER_SITE_OTHER): Payer: Medicaid Other | Admitting: Family Medicine

## 2017-01-14 ENCOUNTER — Encounter: Payer: Self-pay | Admitting: Family Medicine

## 2017-01-14 DIAGNOSIS — J4530 Mild persistent asthma, uncomplicated: Secondary | ICD-10-CM

## 2017-01-14 DIAGNOSIS — F329 Major depressive disorder, single episode, unspecified: Secondary | ICD-10-CM | POA: Diagnosis not present

## 2017-01-14 DIAGNOSIS — D259 Leiomyoma of uterus, unspecified: Secondary | ICD-10-CM

## 2017-01-14 DIAGNOSIS — I10 Essential (primary) hypertension: Secondary | ICD-10-CM

## 2017-01-14 DIAGNOSIS — F32A Depression, unspecified: Secondary | ICD-10-CM

## 2017-01-14 DIAGNOSIS — F1010 Alcohol abuse, uncomplicated: Secondary | ICD-10-CM

## 2017-01-14 DIAGNOSIS — R748 Abnormal levels of other serum enzymes: Secondary | ICD-10-CM

## 2017-01-14 DIAGNOSIS — D219 Benign neoplasm of connective and other soft tissue, unspecified: Secondary | ICD-10-CM | POA: Insufficient documentation

## 2017-01-14 MED ORDER — LISINOPRIL 20 MG PO TABS: 20.0000 mg | ORAL_TABLET | Freq: Every day | ORAL | 3 refills | Status: DC

## 2017-01-14 NOTE — Patient Instructions (Addendum)
Good to see you today!  Thanks for coming in.  For the BP Stop the amlodipine start the lisinopril once daily.  If any lip throat swelling then stop and call me Come in for a blood in one week after starting Call the day before Follow your blood pressure readings goal is < 150/90   Cut back on alcohol  Keep your appointment at [facility]  I will send in a referral for Gyn  Come back in 2 months

## 2017-01-14 NOTE — Assessment & Plan Note (Signed)
Relates drinking 1 bottle of wine on Sat and Sunday but none during the week.  Goal is to stop completely

## 2017-01-14 NOTE — Assessment & Plan Note (Signed)
Stable.  Does have appointment for counseling at [facility]

## 2017-01-14 NOTE — Assessment & Plan Note (Signed)
Will recheck

## 2017-01-14 NOTE — Assessment & Plan Note (Signed)
At gaol but amlodipine causing edema.  Change to lisinopril

## 2017-01-14 NOTE — Progress Notes (Signed)
Subjective  Patient is presenting with the following illnesses  HYPERTENSION Disease Monitoring  Home BP Monitoring (Severity) not checking Symptoms - Chest pain- no    Dyspnea- no Medications (Modifying factors) Compliance-  Daily amlodipine. Lightheadedness-  no  Edema- yes more on R her good leg.  Has been gaining weight since started norvasc Timing - continuous  Duration - years ROS - See HPI  ASTHMA Doing well.  Using Flovent twice a day daily.  Only occsl albuterol.  Sleeping ok from cough point of view.  No sputume  DEPRESSION Has an appointment tomorrow at [facility].  No suicidal ideation or HI but is under stress due to her SO and living situation.  Does not feel needs medication at prsent time   PMH Lab Review   Potassium  Date Value Ref Range Status  01/07/2016 4.1 3.5 - 5.3 mmol/L Final   Sodium  Date Value Ref Range Status  01/07/2016 137 135 - 146 mmol/L Final   Creat  Date Value Ref Range Status  01/07/2016 0.79 0.50 - 1.10 mg/dL Final       Chief Complaint noted Review of Symptoms - see HPI PMH - Smoking status noted.     Objective Vital Signs reviewed .Psych:  Cognition and judgment appear intact. Alert, communicative  and cooperative with normal attention span and concentration. No apparent delusions, illusions, hallucinations Extrem - 1 + edema at right ankle. NL calf exam     Assessments/Plans  No problem-specific Assessment & Plan notes found for this encounter.   See Encounter view if individual problem A/Ps not visible See after visit summary for details of patient instuctions

## 2017-01-14 NOTE — Assessment & Plan Note (Signed)
Stable

## 2017-01-21 ENCOUNTER — Other Ambulatory Visit: Payer: Medicaid Other

## 2017-01-21 DIAGNOSIS — R748 Abnormal levels of other serum enzymes: Secondary | ICD-10-CM

## 2017-01-22 LAB — COMPREHENSIVE METABOLIC PANEL
ALBUMIN: 4.3 g/dL (ref 3.5–5.5)
ALT: 10 IU/L (ref 0–32)
AST: 17 IU/L (ref 0–40)
Albumin/Globulin Ratio: 1.5 (ref 1.2–2.2)
Alkaline Phosphatase: 70 IU/L (ref 39–117)
BUN / CREAT RATIO: 14 (ref 9–23)
BUN: 9 mg/dL (ref 6–20)
Bilirubin Total: <0.2 mg/dL (ref 0.0–1.2)
CALCIUM: 10 mg/dL (ref 8.7–10.2)
CO2: 21 mmol/L (ref 20–29)
CREATININE: 0.65 mg/dL (ref 0.57–1.00)
Chloride: 103 mmol/L (ref 96–106)
GFR, EST AFRICAN AMERICAN: 130 mL/min/{1.73_m2} (ref >59)
GFR, EST NON AFRICAN AMERICAN: 113 mL/min/{1.73_m2} (ref >59)
GLOBULIN, TOTAL: 2.9 g/dL (ref 1.5–4.5)
Glucose: 93 mg/dL (ref 65–99)
Potassium: 4.3 mmol/L (ref 3.5–5.2)
SODIUM: 139 mmol/L (ref 134–144)
Total Protein: 7.2 g/dL (ref 6.0–8.5)

## 2017-02-06 ENCOUNTER — Other Ambulatory Visit: Payer: Self-pay | Admitting: Family Medicine

## 2017-02-06 MED ORDER — GABAPENTIN 100 MG PO CAPS: ORAL_CAPSULE | ORAL | 1 refills | Status: DC

## 2017-02-06 MED ORDER — ALBUTEROL SULFATE HFA 108 (90 BASE) MCG/ACT IN AERS: INHALATION_SPRAY | RESPIRATORY_TRACT | 2 refills | Status: DC

## 2017-02-06 NOTE — Telephone Encounter (Signed)
Patient came in to request refill of:  Name of Medication(s):  Gabapentin, albuterol Last date of OV:  01/14/17 Pharmacy: [facility] on [address]  Will route refill request to Clinic RN.  Discussed with patient policy to call pharmacy for future refills.  Also, discussed refills may take up to 48 hours to approve or deny.  [name] [name]

## 2017-04-21 ENCOUNTER — Ambulatory Visit (INDEPENDENT_AMBULATORY_CARE_PROVIDER_SITE_OTHER): Payer: Medicaid Other | Admitting: Internal Medicine

## 2017-04-21 ENCOUNTER — Encounter: Payer: Self-pay | Admitting: Internal Medicine

## 2017-04-21 ENCOUNTER — Ambulatory Visit
Admission: RE | Admit: 2017-04-21 | Discharge: 2017-04-21 | Disposition: A | Discharge status: Home / Self Care | Payer: Medicaid Other | Source: Ambulatory Visit | Attending: Family Medicine | Admitting: Family Medicine

## 2017-04-21 VITALS — BP 155/92 | Temp 98.4°F

## 2017-04-21 DIAGNOSIS — R1031 Right lower quadrant pain: Secondary | ICD-10-CM

## 2017-04-21 DIAGNOSIS — L299 Pruritus, unspecified: Secondary | ICD-10-CM

## 2017-04-21 MED ORDER — HYDROCORTISONE 1 % EX OINT: 1.0000 "application " | TOPICAL_OINTMENT | Freq: Two times a day (BID) | CUTANEOUS | 0 refills | Status: DC

## 2017-04-21 MED ORDER — HYDROXYZINE HCL 10 MG PO TABS: 10.0000 mg | ORAL_TABLET | Freq: Three times a day (TID) | ORAL | 0 refills | Status: DC | PRN

## 2017-04-21 NOTE — Assessment & Plan Note (Addendum)
Constipation leading differential, as patient with a history of constipation-predominant IBS causing significant abdominal pain in the past requiring disimpaction. Patient did have BM two days ago with no improvement in pain however, so not entirely convinced this is cause. No masses felt on exam however patient with larger body habitus so may be difficult to appreciate. Appendicitis less likely as pain has been ongoing for 5 days and patient with no signs of systemic infection. Also only mildly tender to palpation and tenderness more diffuse. Could be hepatic etiology, as patient with history of alcohol abuse, Hep B, and elevated transaminases in past. Generalized pruritus initially seemed possibly related to hepatic dysfunction, however given presence of rash think this is probably separate issue. Will begin with KUB to rule out constipation, then proceed with complete abdominal US if KUB neg. Will obtain CMP in office today to evaluate LFTs and electrolytes. Will call patient with results.

## 2017-04-21 NOTE — Progress Notes (Signed)
Subjective:   Patient: [name] [name]       Birthdate: 1975/12/26       MRN: [id number]      HPI  [name] [name] is a 41 y.o. female presenting for RLQ pain and generalized pruritus .   RLQ pain Began about 5 days ago. Was initially dull and constant, now is intermittent and sharp. Has a history of IBS but has never been seen by GI and does not take medication for this regularly. Occasionally takes Colace and has had to use enemas before but not recently. Had LLQ pain in past which was due to impaction, however patient says she had a BM two days ago. Has had nausea but no vomiting. Endorses decreased appetite and has eaten primarily only pretzels and ginger ale. Has not taken anything for pain. Has not taken any laxatives. Cannot say if her abdomen appears swollen or not. Denies yellowing of eyes or skin. Does have history of alcohol abuse, but last drink 2 months ago. Denies illicit drug use. Last sexually active one year ago.   Generalized pruritus Also began about five days ago. Primarily on palms, torso, arms, and upper legs. Notes a pustular rash primarily on chest, with finer papular rash on arms, legs, and torso. Has taken Benadryl which improves symptoms for a few hours. Denies new detergents, soaps, moisturizers. Started lisinopril 3 months ago but no other new medications. Has not been in the woods or outside for extended period of time recently. Does have eczema but says current rash is not like typical eczematous lesions and she does not normally have itching with eczema.   Smoking status reviewed. Patient is never smoker.   Review of Systems See HPI.     Objective:  Physical Exam  Constitutional: She is oriented to person, place, and time and well-developed, well-nourished, and in no distress.  HENT:  Head: Normocephalic and atraumatic.  Eyes: Pupils are equal, round, and reactive to light. Conjunctivae are normal. Right eye exhibits no discharge. Left eye exhibits no  discharge. No scleral icterus.  Pulmonary/Chest: Effort normal. No respiratory distress.  Abdominal:  Generalized TTP, but slightly worse in RLQ. Abd soft, non-distended. No guarding or rebound. Decreased BS. No masses noted. No hepatomegaly noted but difficult to determine due to patient's body habitus.   Neurological: She is alert and oriented to person, place, and time.  Skin:  Erythematous pustular lesions located on chest, most with excoriations. No signs of infection.  Fine flesh-colored papular rash on arms, abdomen, and back, also with excoriations.  No jaundice.   Psychiatric: Affect and judgment normal.      Assessment & Plan:  RLQ abdominal pain Constipation leading differential, as patient with a history of constipation-predominant IBS causing significant abdominal pain in the past requiring disimpaction. Patient did have BM two days ago with no improvement in pain however, so not entirely convinced this is cause. No masses felt on exam however patient with larger body habitus so may be difficult to appreciate. Appendicitis less likely as pain has been ongoing for 5 days and patient with no signs of systemic infection. Also only mildly tender to palpation and tenderness more diffuse. Could be hepatic etiology, as patient with history of alcohol abuse, Hep B, and elevated transaminases in past. Generalized pruritus initially seemed possibly related to hepatic dysfunction, however given presence of rash think this is probably separate issue. Will begin with KUB to rule out constipation, then proceed with complete abdominal US if KUB neg. Will  obtain CMP in office today to evaluate LFTs and electrolytes. Will call patient with results.   Pruritus Generalized, likely 2/2 rash. Initially thought possible hepatic etiology given accompanying abdominal pain as well as patient's history of alcohol abuse and elevated transaminases, however more likely that rash is cause. Rash appears most  consistent with a contact dermatitis, however aggravating factor unknown, as patient reporting no new exposures. Multiple excoriations but no signs of infection. Proceeding with further work up to rule out liver etiology of abdominal pain, which would also suggest hepatic cause of pruritus if abnormalities noted. In the meantime, will treat rash and pruritus as separate issue.  - Atarax PRN TID - Hydrocortisone 1% BID  Precepted with Dr. McKeag/McDiarmid.   Adin Hector, MD, MPH PGY-3 [facility] Pager [phone]

## 2017-04-21 NOTE — Assessment & Plan Note (Signed)
Generalized, likely 2/2 rash. Initially thought possible hepatic etiology given accompanying abdominal pain as well as patient's history of alcohol abuse and elevated transaminases, however more likely that rash is cause. Rash appears most consistent with a contact dermatitis, however aggravating factor unknown, as patient reporting no new exposures. Multiple excoriations but no signs of infection. Proceeding with further work up to rule out liver etiology of abdominal pain, which would also suggest hepatic cause of pruritus if abnormalities noted. In the meantime, will treat rash and pruritus as separate issue.  - Atarax PRN TID - Hydrocortisone 1% BID

## 2017-04-21 NOTE — Patient Instructions (Addendum)
It was nice meeting you today Ms. Gacek!  Please go to [facility] to have your xray performed. If this is normal, we will proceed with an ultrasound. I will call to let you know the results as soon as they are available. I will also let you know the results of your lab testing at that time.   Please apply the hydrocortisone cream to your rash twice a day. You can take Atarax up to three times a day as needed for itching. This may make you sleepy.   If you have any questions or concerns, please feel free to call the clinic.   Be well,  Dr. Avon Gully

## 2017-04-22 LAB — CMP14+EGFR
ALBUMIN: 3.9 g/dL (ref 3.5–5.5)
ALK PHOS: 65 IU/L (ref 39–117)
ALT: 10 IU/L (ref 0–32)
AST: 13 IU/L (ref 0–40)
Albumin/Globulin Ratio: 1.5 (ref 1.2–2.2)
BUN / CREAT RATIO: 11 (ref 9–23)
BUN: 12 mg/dL (ref 6–20)
CO2: 24 mmol/L (ref 20–29)
Calcium: 9.6 mg/dL (ref 8.7–10.2)
Chloride: 103 mmol/L (ref 96–106)
Creatinine, Ser: 1.05 mg/dL — ABNORMAL HIGH (ref 0.57–1.00)
GFR calc Af Amer: 77 mL/min/{1.73_m2} (ref >59)
GFR calc non Af Amer: 67 mL/min/{1.73_m2} (ref >59)
GLUCOSE: 93 mg/dL (ref 65–99)
Globulin, Total: 2.6 g/dL (ref 1.5–4.5)
POTASSIUM: 4.1 mmol/L (ref 3.5–5.2)
Sodium: 140 mmol/L (ref 134–144)
Total Protein: 6.5 g/dL (ref 6.0–8.5)

## 2017-04-23 NOTE — Progress Notes (Signed)
Called patient regarding normal KUB and LFTs. Patient still experiencing pain. Less likely hepatic etiology given normal LFTs, but will proceed with complete abd [place] for further work up. Patient to go to [facility] to have this done either tomorrow or Monday. Will call with results when available.   Adin Hector, MD, MPH PGY-3 [employer] [facility] Pager [phone]

## 2017-04-23 NOTE — Addendum Note (Signed)
Addended by: Adin Hector on: 04/23/2017 10:29 AM   Modules accepted: Orders

## 2017-05-07 ENCOUNTER — Ambulatory Visit: Payer: Self-pay | Admitting: Family Medicine

## 2017-05-25 ENCOUNTER — Ambulatory Visit (INDEPENDENT_AMBULATORY_CARE_PROVIDER_SITE_OTHER): Payer: Medicaid Other | Admitting: Family Medicine

## 2017-05-25 ENCOUNTER — Encounter: Payer: Self-pay | Admitting: Family Medicine

## 2017-05-25 VITALS — BP 152/98 | HR 104 | Temp 98.6°F | Ht <= 58 in | Wt 183.0 lb

## 2017-05-25 DIAGNOSIS — F32A Depression, unspecified: Secondary | ICD-10-CM

## 2017-05-25 DIAGNOSIS — Z23 Encounter for immunization: Secondary | ICD-10-CM | POA: Diagnosis not present

## 2017-05-25 DIAGNOSIS — J4530 Mild persistent asthma, uncomplicated: Secondary | ICD-10-CM

## 2017-05-25 DIAGNOSIS — F329 Major depressive disorder, single episode, unspecified: Secondary | ICD-10-CM | POA: Diagnosis not present

## 2017-05-25 DIAGNOSIS — L299 Pruritus, unspecified: Secondary | ICD-10-CM | POA: Diagnosis not present

## 2017-05-25 DIAGNOSIS — M797 Fibromyalgia: Secondary | ICD-10-CM

## 2017-05-25 DIAGNOSIS — I10 Essential (primary) hypertension: Secondary | ICD-10-CM

## 2017-05-25 MED ORDER — GABAPENTIN 100 MG PO CAPS: ORAL_CAPSULE | ORAL | 1 refills | Status: DC

## 2017-05-25 MED ORDER — PREDNISONE 20 MG PO TABS: 40.0000 mg | ORAL_TABLET | Freq: Every day | ORAL | 0 refills | Status: AC

## 2017-05-25 MED ORDER — TRIAMCINOLONE ACETONIDE 0.1 % EX CREA: 1.0000 "application " | TOPICAL_CREAM | Freq: Two times a day (BID) | CUTANEOUS | 0 refills | Status: DC

## 2017-05-25 MED ORDER — TRIAMCINOLONE 0.1 % CREAM:EUCERIN CREAM 1:1: TOPICAL_CREAM | Freq: Two times a day (BID) | CUTANEOUS | Status: DC | PRN

## 2017-05-25 MED ORDER — CITALOPRAM HYDROBROMIDE 20 MG PO TABS: 20.0000 mg | ORAL_TABLET | Freq: Every day | ORAL | 3 refills | Status: DC

## 2017-05-25 NOTE — Patient Instructions (Addendum)
It was great to meet you today! Thank you for letting me participate in your care!  Today, we discussed your recent upper respiratory infection. You don't need any antibiotics at this time as it is likely a viral infection. However, I am writing you for a 5 day course of oral Prednisone for an acute Asthma exacerbation.  I am starting you on a medication for depression called Celexa. I will be important to see you in one month to see how you are doing with this medication.  I am also writing you for a cream to help with the itching.  We are also getting some blood work to follow up on your kidney function.  Please return in 2 weeks to have your blood pressure checked to make sure it has decreased.    Be well, Harolyn Rutherford, DO PGY-1, [facility]

## 2017-05-25 NOTE — Progress Notes (Signed)
Subjective: Chief Complaint  Patient presents with  . cough and congestion  . Rash     HPI: [name] [name] is a 41 y.o. presenting to clinic today to discuss the following:  Ms [name] [name] is a 41y/o female with PMH of cerebral palsy, HTN, Asthma, IBS, and depression who presents for:  1 3 days of cough and congestion 2 High blood pressure 3 Depression 4 Generalized itching with dark spots on abdomen  Her cough and congestion is associated with a productive yellowish sputum but she denies fever, difficulty breathing, or any SOB. She has had a runny nose but no sore throat.  She also endorsed today a difficult home situation due to a divorce but unable to leave the home due to financial reasons. For the present, her and her husband and separated but still living in the same home.  She also in endorsing the development of dark itchy spots on her abdomen and legs. This began last week before her recent sickness but she has not had this before.  She denies any abdominal pain, nausea, vomiting, diarrhea, constipation, urinary pain or burning.  Health Maintenance: flu shot     ROS noted in HPI.   Past Medical, Surgical, Social, and Family History Reviewed & Updated per EMR.   Pertinent Historical Findings include:   History  Smoking Status  . Never Smoker  Smokeless Tobacco  . Never Used      Objective: BP (!) 152/98   Pulse (!) 104   Temp 98.6 F (37 C) (Oral)   Ht _0  (1.448 m)   Wt 183 lb (83 kg)   SpO2 99%   BMI 39.60 kg/m  Vitals and nursing notes reviewed  Physical Exam Gen: Alert and Oriented x 3, NAD HEENT: Normocephalic, atraumatic, PERRLA, EOMI, TM visible with good light reflex, non-swollen, non-erythematous turbinates, non-erythematous pharyngeal mucosa, no exudates Neck: trachea midline, no thyroidmegaly, no LAD CV: RRR, no murmurs, normal S1, S2 split, +2 pulses dorsalis pedis bilaterally, no JVD, no carotid bruits Resp: End-expiratory  wheezing in lower lung fields bilaterally, no rales, or rhonchi, comfortable work of breathing Abd: non-distended, non-tender, soft, +bs in all four quadrants, no hepatosplenomegaly MSK: FROM in all four extremities Ext: no clubbing, cyanosis, or edema Neuro: CN II-XII grossly intact, no focal or gross deficits Skin: warm, dry, intact, no rashes Psych: appropriate behavior, mood, denies any suicidal ideation   Results for orders placed or performed in visit on 05/25/17 (from the past 72 hour(s))  BMP8+eGFR     Status: None   Collection Time: 05/25/17  4:33 PM  Result Value Ref Range   Glucose 92 65 - 99 mg/dL   BUN 8 6 - 20 mg/dL   Creatinine, Ser 0.58 0.57 - 1.00 mg/dL   GFR calc non Af Amer 116 >59 mL/min/1.73   GFR calc Af Amer 134 >59 mL/min/1.73   BUN/Creatinine Ratio 14 9 - 23   Sodium 137 134 - 144 mmol/L   Potassium 4.0 3.5 - 5.2 mmol/L   Chloride 103 96 - 106 mmol/L   CO2 21 20 - 29 mmol/L   Calcium 9.4 8.7 - 10.2 mg/dL    Assessment/Plan:  Hypertension BP is not well controlled at this visit. She was 150/100 and on my recheck found to be 152/98. Due to her recent sickness I suspect it may have caused a transient rise in her BP.   I will have her return for a nurse visit in two weeks  to reassess her BP and adjust medications at that time if necessary.  Asthma Due to symptoms of URI and end-expiratory wheezing on exam I am concerned she may have an acute asthma exacerbation. Patient endorsed having to be hospitalized for several days due to a similar event in the past.  Prescribing Prednisone 44m daily for 5 days. Consider PFT at next visit if she is needing inhaler more. Symptoms of asthma for now are stable.  Fibromyalgia Refilled Gabapentin 1037mBID  Pruritus Patient has excoriated areas of macular hyperpigmented areas that she describes as itching. Unclear etiology at this time.   Prescribed Kenalog 0.1% due to patient trying hydrocortisone creams with no  relief.  Depression Patient states she does see a counselor regularly but is getting a new one soon due to dissatisfaction with her current counselor.  Patient had a PHQ-9 score today of 21 and was not currently on medication and states she currently feels depressed, decreased activity, lack of energy, lack of enjoyment of usual things, and decreased sleep. States this has been going on for more than 18 months but symptoms for about 6-8. Meets criteria for major depression.  Started her on Celexa 2045maily. Will reassess in 1 month.     PATIENT EDUCATION PROVIDED: See AVS    Diagnosis and plan along with any newly prescribed medication(s) were discussed in detail with this patient today. The patient verbalized understanding and agreed with the plan. Patient advised if symptoms worsen return to clinic or ER.   Health Maintainance:   Orders Placed This Encounter  Procedures  . Flu Vaccine QUAD 36+ mos IM  . BMP8+eGFR    Meds ordered this encounter  Medications  . gabapentin (NEURONTIN) 100 MG capsule    Sig: 2 capsules PO twice daily    Dispense:  360 capsule    Refill:  1  . predniSONE (DELTASONE) 20 MG tablet    Sig: Take 2 tablets (40 mg total) by mouth daily with breakfast.    Dispense:  10 tablet    Refill:  0  . DISCONTD: triamcinolone 0.1 % cream : eucerin cream, 1:1  . citalopram (CELEXA) 20 MG tablet    Sig: Take 1 tablet (20 mg total) by mouth daily.    Dispense:  30 tablet    Refill:  3  . triamcinolone cream (KENALOG) 0.1 %    Sig: Apply 1 application topically 2 (two) times daily.    Dispense:  30 g    Refill:  0     TimHarolyn RutherfordO 05/25/2017, 3:15 PM PGY-1, [facility]

## 2017-05-26 DIAGNOSIS — Z23 Encounter for immunization: Secondary | ICD-10-CM | POA: Insufficient documentation

## 2017-05-26 LAB — BMP8+EGFR
BUN / CREAT RATIO: 14 (ref 9–23)
BUN: 8 mg/dL (ref 6–20)
CO2: 21 mmol/L (ref 20–29)
CREATININE: 0.58 mg/dL (ref 0.57–1.00)
Calcium: 9.4 mg/dL (ref 8.7–10.2)
Chloride: 103 mmol/L (ref 96–106)
GFR, EST AFRICAN AMERICAN: 134 mL/min/{1.73_m2} (ref >59)
GFR, EST NON AFRICAN AMERICAN: 116 mL/min/{1.73_m2} (ref >59)
Glucose: 92 mg/dL (ref 65–99)
POTASSIUM: 4 mmol/L (ref 3.5–5.2)
SODIUM: 137 mmol/L (ref 134–144)

## 2017-05-27 NOTE — Assessment & Plan Note (Signed)
Patient has excoriated areas of macular hyperpigmented areas that she describes as itching. Unclear etiology at this time.   Prescribed Kenalog 0.1% due to patient trying hydrocortisone creams with no relief.

## 2017-05-27 NOTE — Assessment & Plan Note (Signed)
Refilled Gabapentin 100mg  BID

## 2017-05-27 NOTE — Assessment & Plan Note (Signed)
Due to symptoms of URI and end-expiratory wheezing on exam I am concerned she may have an acute asthma exacerbation. Patient endorsed having to be hospitalized for several days due to a similar event in the past.  Prescribing Prednisone 40mg  daily for 5 days. Consider PFT at next visit if she is needing inhaler more. Symptoms of asthma for now are stable.

## 2017-05-27 NOTE — Assessment & Plan Note (Signed)
Patient states she does see a counselor regularly but is getting a new one soon due to dissatisfaction with her current counselor.  Patient had a PHQ-9 score today of 21 and was not currently on medication and states she currently feels depressed, decreased activity, lack of energy, lack of enjoyment of usual things, and decreased sleep. States this has been going on for more than 18 months but symptoms for about 6-8. Meets criteria for major depression.  Started her on Celexa 20mg  daily. Will reassess in 1 month.

## 2017-05-27 NOTE — Assessment & Plan Note (Signed)
BP is not well controlled at this visit. She was 150/100 and on my recheck found to be 152/98. Due to her recent sickness I suspect it may have caused a transient rise in her BP.   I will have her return for a nurse visit in two weeks to reassess her BP and adjust medications at that time if necessary.

## 2017-06-08 ENCOUNTER — Encounter: Payer: Self-pay | Admitting: Family Medicine

## 2017-06-08 NOTE — Progress Notes (Signed)
Patient will be mailed the results of her BMP. Her results were normal.   I was concerned due to a recent increase in her creatinine at her last BMP but it has now resolved.

## 2017-07-01 ENCOUNTER — Telehealth: Payer: Self-pay | Admitting: Family Medicine

## 2017-07-01 NOTE — Telephone Encounter (Signed)
Pt would like a referral to rhemoutologist.  Dr Manya Silvas ortho.

## 2017-07-01 NOTE — Telephone Encounter (Signed)
Pt also needs a referral to a ob-gyn.  She has medicaid

## 2017-07-03 NOTE — Telephone Encounter (Signed)
Pt called [facility].  Dr Blinda Leatherwood isnt taking new pts now. Please refer to another rheumatologist that takes Medicaid

## 2017-07-13 ENCOUNTER — Other Ambulatory Visit: Payer: Self-pay

## 2017-07-13 ENCOUNTER — Ambulatory Visit (INDEPENDENT_AMBULATORY_CARE_PROVIDER_SITE_OTHER): Payer: Medicaid Other | Admitting: Family Medicine

## 2017-07-13 ENCOUNTER — Encounter: Payer: Self-pay | Admitting: Family Medicine

## 2017-07-13 VITALS — BP 150/90 | HR 97 | Temp 98.8°F | Wt 186.4 lb

## 2017-07-13 DIAGNOSIS — J4540 Moderate persistent asthma, uncomplicated: Secondary | ICD-10-CM

## 2017-07-13 DIAGNOSIS — D219 Benign neoplasm of connective and other soft tissue, unspecified: Secondary | ICD-10-CM | POA: Diagnosis not present

## 2017-07-13 DIAGNOSIS — F329 Major depressive disorder, single episode, unspecified: Secondary | ICD-10-CM | POA: Diagnosis not present

## 2017-07-13 DIAGNOSIS — M797 Fibromyalgia: Secondary | ICD-10-CM

## 2017-07-13 DIAGNOSIS — Z0000 Encounter for general adult medical examination without abnormal findings: Secondary | ICD-10-CM

## 2017-07-13 DIAGNOSIS — F32A Depression, unspecified: Secondary | ICD-10-CM

## 2017-07-13 DIAGNOSIS — I10 Essential (primary) hypertension: Secondary | ICD-10-CM | POA: Diagnosis not present

## 2017-07-13 MED ORDER — BENZONATATE 100 MG PO CAPS: 100.0000 mg | ORAL_CAPSULE | Freq: Two times a day (BID) | ORAL | 0 refills | Status: DC | PRN

## 2017-07-13 MED ORDER — LISINOPRIL-HYDROCHLOROTHIAZIDE 20-12.5 MG PO TABS: 1.0000 | ORAL_TABLET | Freq: Every day | ORAL | 3 refills | Status: DC

## 2017-07-13 MED ORDER — GUAIFENESIN 200 MG PO TABS: 200.0000 mg | ORAL_TABLET | ORAL | 0 refills | Status: DC | PRN

## 2017-07-13 MED ORDER — FLUTICASONE PROPIONATE HFA 110 MCG/ACT IN AERO: 2.0000 | INHALATION_SPRAY | Freq: Two times a day (BID) | RESPIRATORY_TRACT | 12 refills | Status: DC

## 2017-07-13 NOTE — Progress Notes (Signed)
Subjective: No chief complaint on file.  [name] [name] is a 41y/o female with a PMH of cerebral palsy, HTN, Asthma, IBS, and depression who presents for:  1 Follow up to see if starting Celexa has helped her with symptoms of depression. 2 Follow for her HTN 3 Follow up to see if her asthma has been well controlled on her current regimen.  Today her blood pressure today remains elevated at 150/90 and 145/92 on recheck. She states she does not check it at home but will check [facility].   She states Celexa has helped her with her symptoms of depression. She has been taking it as directed and states she has more energy but it does make her sleepy.   Her asthma is still not improved since last visit. She reports using her albuterol inhaler 3-4 times per day daily while taking Flovent twice a day and having multiple episodes of waking up at night due to wheezing and coughing. At her last visit she was given a short course of prednisone that helped some but it did not resolve her symptoms.  She is also endorsing a continual cough and cold like illness. She states it has been lingering for several weeks. She has no fever no sinus tenderness, no exudates, no sore throat and states if feels like the "common cold".   Review of Systems  Constitutional: Negative for chills, fever and weight loss.  HENT: Positive for congestion.   Respiratory: Positive for cough, sputum production and wheezing. Negative for hemoptysis and shortness of breath.   Cardiovascular: Negative for chest pain and palpitations.  Gastrointestinal: Negative for abdominal pain, constipation, diarrhea, nausea and vomiting.  Genitourinary: Negative for dysuria, frequency and urgency.  Skin: Positive for rash. Negative for itching.    Health Maintenance: None today     ROS noted in HPI.   Past Medical, Surgical, Social, and Family History Reviewed & Updated per EMR.   Pertinent Historical Findings  include:   Social History   Tobacco Use  Smoking Status Never Smoker  Smokeless Tobacco Never Used      Objective: There were no vitals taken for this visit. Vitals and nursing notes reviewed  Physical Exam  Constitutional: She is oriented to person, place, and time. She appears distressed.  HENT:  Head: Normocephalic and atraumatic.  Mouth/Throat: Oropharynx is clear and moist. No oropharyngeal exudate.  Eyes: Conjunctivae are normal. Pupils are equal, round, and reactive to light.  Cardiovascular: Normal rate, regular rhythm and normal heart sounds.  No murmur heard. Pulmonary/Chest: Effort normal. She has wheezes. She has no rales.  Abdominal: Soft. Bowel sounds are normal. She exhibits no distension. There is no tenderness. There is no rebound.  Musculoskeletal: Normal range of motion. She exhibits no edema or tenderness.  Neurological: She is alert and oriented to person, place, and time.  Skin: Skin is warm and dry. No erythema.     No results found for this or any previous visit (from the past 72 hour(s)).  Assessment/Plan:  Hypertension I have changed her BP medication regimen due to high blood pressure again today and on recheck. I have discontinued her lisinopril 20mg .  I have started her on a combo pill of lisinopril 20mg  and HCTZ 12.5mg  for better BP control.  I have also gotten labs to check potassium and kidney function today.  Asthma I have made changes to her asthma regimen.  I have increased her fluticasone from 44 to 162mcg/act BID.  This change was due to her endorse continued night time awakenings with SOB and having to use her rescue inhaler nearly every single day.  Of note, patient feels like she has had a lingering cold which could be contributing but her symptoms have persisted for 2 months so it can't all be just due to an acute illness. To help with these acute "cold-like "symptoms I have prescribed guaifenesin and tessalon  pearls.  Depression Her PHQ-9 score today was 15 down from 21 at last visit and since starting Celexa.   She states she feels like Celexa is helping her.  Continue with current dose of citaplopram at 20mg  daily and continue to monitor.  Fibroids Patient requested a referral for fibroids to OB/GYN.  I have sent a referral for her to [facility], OB/GYN.  Fibromyalgia Continue Gabapentin 100mg   Patient will try to get into water work out routine at the [facility] to help with symptoms.  I recently referred her to rheumatology but the only practitioner in [place] was full.   If she continues to have worsening symptoms consider starting her on Amitriptyline and referring her to [facility] Rheumatology.  Healthcare maintenance Obtained HgbA1c, Lipid panel as these had not recently been done.     PATIENT EDUCATION PROVIDED: See AVS    Diagnosis and plan along with any newly prescribed medication(s) were discussed in detail with this patient today. The patient verbalized understanding and agreed with the plan. Patient advised if symptoms worsen return to clinic or ER.   Health Maintainance:   Orders Placed This Encounter  Procedures  . Lipid Panel  . Hemoglobin A1c  . Ambulatory referral to Obstetrics / Gynecology    Referral Priority:   Routine    Referral Type:   Consultation    Referral Reason:   Patient Preference    Requested Specialty:   Obstetrics and Gynecology    Number of Visits Requested:   1    Meds ordered this encounter  Medications  . lisinopril-hydrochlorothiazide (ZESTORETIC) 20-12.5 MG tablet    Sig: Take 1 tablet by mouth daily.    Dispense:  90 tablet    Refill:  3  . fluticasone (FLOVENT HFA) 110 MCG/ACT inhaler    Sig: Inhale 2 puffs into the lungs 2 (two) times daily.    Dispense:  1 Inhaler    Refill:  12  . benzonatate (TESSALON) 100 MG capsule    Sig: Take 1 capsule (100 mg total) by mouth 2 (two) times daily as needed for  cough.    Dispense:  20 capsule    Refill:  0  . guaiFENesin 200 MG tablet    Sig: Take 1 tablet (200 mg total) by mouth every 4 (four) hours as needed for cough or to loosen phlegm.    Dispense:  30 suppository    Refill:  0     Harolyn Rutherford, DO 07/13/2017, 3:05 PM PGY-1, [facility]\

## 2017-07-13 NOTE — Patient Instructions (Signed)
It was great to see you again today! Thank you for letting me once again participate in your care!  Today, we discussed several things. For your high blood pressure I am starting you on a new combo pill. Please take it daily. It will make you urinate more but over time your body should adjust. For your asthma I am increasing your Flovent dose. Please use it as prescribed. Cont to use albuterol as needed.   I have given you a referral to OB-GYN for fibroid management.   Please continue taking Celexa as prescribed, your depression is improved since your last visit.   For you cough and cold I prescribed tessalon pearls and guaifenesin to help improve your symptoms.  If you have any questions or concerns please do not hesitate to call the office.  Be well, Harolyn Rutherford, DO PGY-1, [facility]

## 2017-07-14 LAB — LIPID PANEL
CHOLESTEROL TOTAL: 187 mg/dL (ref 100–199)
Chol/HDL Ratio: 3.5 ratio (ref 0.0–4.4)
HDL: 54 mg/dL (ref >39)
LDL CALC: 87 mg/dL (ref 0–99)
TRIGLYCERIDES: 229 mg/dL — AB (ref 0–149)
VLDL CHOLESTEROL CAL: 46 mg/dL — AB (ref 5–40)

## 2017-07-14 LAB — HEMOGLOBIN A1C
Est. average glucose Bld gHb Est-mCnc: 105 mg/dL
Hgb A1c MFr Bld: 5.3 % (ref 4.8–5.6)

## 2017-07-23 NOTE — Assessment & Plan Note (Signed)
Obtained HgbA1c, Lipid panel as these had not recently been done.

## 2017-07-23 NOTE — Assessment & Plan Note (Signed)
Her PHQ-9 score today was 15 down from 21 at last visit and since starting Celexa.   She states she feels like Celexa is helping her.  Continue with current dose of citaplopram at 20mg  daily and continue to monitor.

## 2017-07-23 NOTE — Assessment & Plan Note (Signed)
Continue Gabapentin 100mg   Patient will try to get into water work out routine at the [facility] to help with symptoms.  I recently referred her to rheumatology but the only practitioner in [place] was full.   If she continues to have worsening symptoms consider starting her on Amitriptyline and referring her to [facility] Rheumatology.

## 2017-07-23 NOTE — Assessment & Plan Note (Signed)
I have changed her BP medication regimen due to high blood pressure again today and on recheck. I have discontinued her lisinopril 20mg .  I have started her on a combo pill of lisinopril 20mg  and HCTZ 12.5mg  for better BP control.  I have also gotten labs to check potassium and kidney function today.

## 2017-07-23 NOTE — Assessment & Plan Note (Signed)
Patient requested a referral for fibroids to OB/GYN.  I have sent a referral for her to [facility], OB/GYN.

## 2017-07-23 NOTE — Assessment & Plan Note (Addendum)
I have made changes to her asthma regimen.  I have increased her fluticasone from 44 to 126mcg/act BID. This change was due to her endorse continued night time awakenings with SOB and having to use her rescue inhaler nearly every single day.  Of note, patient feels like she has had a lingering cold which could be contributing but her symptoms have persisted for 2 months so it can't all be just due to an acute illness. To help with these acute "cold-like "symptoms I have prescribed guaifenesin and tessalon pearls.

## 2017-08-18 ENCOUNTER — Emergency Department (HOSPITAL_COMMUNITY)
Admission: EM | Admit: 2017-08-18 | Discharge: 2017-08-19 | Disposition: A | Discharge status: Home / Self Care | Payer: Medicaid Other | Attending: Emergency Medicine | Admitting: Emergency Medicine

## 2017-08-18 ENCOUNTER — Other Ambulatory Visit: Payer: Self-pay

## 2017-08-18 ENCOUNTER — Emergency Department (HOSPITAL_COMMUNITY): Payer: Medicaid Other

## 2017-08-18 ENCOUNTER — Encounter (HOSPITAL_COMMUNITY): Payer: Self-pay | Admitting: *Deleted

## 2017-08-18 DIAGNOSIS — W010XXA Fall on same level from slipping, tripping and stumbling without subsequent striking against object, initial encounter: Secondary | ICD-10-CM | POA: Diagnosis not present

## 2017-08-18 DIAGNOSIS — Y929 Unspecified place or not applicable: Secondary | ICD-10-CM | POA: Insufficient documentation

## 2017-08-18 DIAGNOSIS — J45909 Unspecified asthma, uncomplicated: Secondary | ICD-10-CM | POA: Insufficient documentation

## 2017-08-18 DIAGNOSIS — Z79899 Other long term (current) drug therapy: Secondary | ICD-10-CM | POA: Insufficient documentation

## 2017-08-18 DIAGNOSIS — Y999 Unspecified external cause status: Secondary | ICD-10-CM | POA: Diagnosis not present

## 2017-08-18 DIAGNOSIS — Y9389 Activity, other specified: Secondary | ICD-10-CM | POA: Insufficient documentation

## 2017-08-18 DIAGNOSIS — S99912A Unspecified injury of left ankle, initial encounter: Secondary | ICD-10-CM | POA: Diagnosis present

## 2017-08-18 DIAGNOSIS — G809 Cerebral palsy, unspecified: Secondary | ICD-10-CM | POA: Diagnosis not present

## 2017-08-18 DIAGNOSIS — S82892A Other fracture of left lower leg, initial encounter for closed fracture: Secondary | ICD-10-CM

## 2017-08-18 HISTORY — DX: Fibromyalgia: M79.7

## 2017-08-18 MED ORDER — HYDROCODONE-ACETAMINOPHEN 5-325 MG PO TABS
1.0000 | ORAL_TABLET | Freq: Once | ORAL | Status: AC
  Administered 2017-08-18: 1 via ORAL
  Filled 2017-08-18: qty 1

## 2017-08-18 NOTE — ED Triage Notes (Signed)
EMS reports pt was in kitchen stepped and twisted her ankle, heard a pop. Ankle is swollen. Also has asthma, pt was given a breathing treatment due to URI.

## 2017-08-19 MED ORDER — HYDROCODONE-ACETAMINOPHEN 5-325 MG PO TABS: 1.0000 | ORAL_TABLET | Freq: Three times a day (TID) | ORAL | 0 refills | Status: DC

## 2017-08-19 NOTE — ED Provider Notes (Signed)
[facility] DEPT Provider Note   CSN: [id number] Arrival date & time: 08/18/17  2114     History   Chief Complaint Chief Complaint  Patient presents with  . Ankle Pain    Left    HPI [name] [name] is a 42 y.o. female.  HPI 42 year old female with history of cerebral palsy comes in after a fall.  Patient was playing with her children when she slipped and fell.  Patient heard a pop, and was unable to get up and bear weight.  Patient's pain is located in the entire left ankle.  Patient has no history of injury to that side.  Patient denies any numbness or tingling.  Past Medical History:  Diagnosis Date  . Abnormal Pap smear   . Asthma   . Cerebral palsy ([place])   . Depression    no medication treatment   . Fibroid   . Fibromyalgia syndrome   . 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 Healthcare maintenance 07/13/2017  . Need for immunization against influenza 05/26/2017  . RLQ abdominal pain 04/21/2017  . Pruritus 04/21/2017  . Fibroids 01/14/2017  . Axillary mass 12/03/2016  . Abnormal transaminases 08/22/2015  . Alcohol abuse 08/22/2015  . Fibromyalgia 05/03/2015  . Depression 10/12/2013  . Contraception management 10/12/2013  . Hypertension 06/18/2009  . CEREBRAL PALSY 09/24/2006  . Asthma 09/24/2006  . IRRITABLE BOWEL SYNDROME 09/24/2006    Past Surgical History:  Procedure Laterality Date  . CESAREAN SECTION  03/20/2011   Procedure: CESAREAN SECTION;  Surgeon: Blane Ohara Meisinger;  Location: [facility];  Service: Gynecology;  Laterality: N/A;  Primary Female at 16  . MOUTH SURGERY     42 years old  . MYOMECTOMY  03/20/2011   Procedure: MYOMECTOMY;  Surgeon: Blane Ohara Meisinger;  Location: [facility];  Service: Gynecology;  Laterality: N/A;    OB History    Gravida Para Term Preterm AB Living   1 1 1  0 0 1   SAB TAB Ectopic Multiple Live Births   0 0 0 0 1       Home Medications    Prior to Admission medications   Medication Sig Start Date End Date Taking? Authorizing Provider  albuterol (PROAIR HFA) 108 (90 Base) MCG/ACT inhaler USE 2 PUFFS INTO THE LUNGS EVERY 6 (SIX) HOURS AS NEEDED FOR WHEEZING. 02/06/17  Yes Chambliss, Jeb Levering, MD  citalopram (CELEXA) 20 MG tablet Take 1 tablet (20 mg total) by mouth daily. 05/25/17  Yes Lockamy, Timothy, DO  fluticasone (FLOVENT HFA) 110 MCG/ACT inhaler Inhale 2 puffs into the lungs 2 (two) times daily. 07/13/17  Yes Lockamy, Timothy, DO  gabapentin (NEURONTIN) 100 MG capsule 2 capsules PO twice daily Patient taking differently: Take 200 mg by mouth 2 (two) times daily.  05/25/17  Yes Lockamy, Timothy, DO  lisinopril-hydrochlorothiazide (ZESTORETIC) 20-12.5 MG tablet Take 1 tablet by mouth daily. 07/13/17  Yes Lockamy, Timothy, DO  benzonatate (TESSALON) 100 MG capsule Take 1 capsule (100 mg total) by mouth 2 (two) times daily as needed for cough. Patient not taking: Reported on 08/18/2017 07/13/17   Nuala Alpha, DO  guaiFENesin 200 MG tablet Take 1 tablet (200 mg total) by mouth every 4 (four) hours as needed for cough or to loosen phlegm. Patient not taking: Reported on 08/18/2017 07/13/17   Nuala Alpha, DO  HYDROcodone-acetaminophen (NORCO/VICODIN) 5-325 MG tablet  Take 1 tablet by mouth every 8 (eight) hours. 08/19/17   Varney Biles, MD  hydrocortisone 1 % ointment Apply 1 application topically 2 (two) times daily. Patient not taking: Reported on 08/18/2017 04/21/17   Verner Mould, MD  hydrOXYzine (ATARAX/VISTARIL) 10 MG tablet Take 1 tablet (10 mg total) by mouth 3 (three) times daily as needed. Patient not taking: Reported on 08/18/2017 04/21/17   Verner Mould, MD  loratadine (CLARITIN) 10 MG tablet Take 1 tablet (10 mg total) by mouth daily. Patient not taking: Reported on 12/03/2016 11/11/16   Katheren Shams, DO  triamcinolone cream (KENALOG) 0.1 % Apply  1 application topically 2 (two) times daily. Patient not taking: Reported on 08/18/2017 05/25/17   Nuala Alpha, DO    Family History Family History  Problem Relation Age of Onset  . Birth defects Cousin        Down Syndrome  . Anesthesia problems Mother     Social History Social History   Tobacco Use  . Smoking status: Never Smoker  . Smokeless tobacco: Never Used  Substance Use Topics  . Alcohol use: Yes    Alcohol/week: 0.6 oz    Types: 1 Glasses of wine per week    Comment: 1 glass wine per day until known pregnancy at approx 12 weeks  . Drug use: No     Allergies   Pineapple and Tomato   Review of Systems Review of Systems  Constitutional: Positive for activity change.  Musculoskeletal: Positive for arthralgias.  Neurological: Negative for numbness.  Hematological: Does not bruise/bleed easily.    Physical Exam Updated Vital Signs BP (!) 143/91 (BP Location: Left Arm)   Pulse 99   Temp 98.6 F (37 C) (Oral)   Resp 18   SpO2 97%   Physical Exam  Constitutional: She is oriented to person, place, and time. She appears well-developed.  HENT:  Head: Normocephalic and atraumatic.  Eyes: EOM are normal.  Neck: Normal range of motion. Neck supple.  Cardiovascular: Normal rate.  Pulmonary/Chest: Effort normal.  Abdominal: Bowel sounds are normal.  Musculoskeletal: She exhibits edema and tenderness.  Pt has generalized tenderness of the ankle more T's.  Patient's swelling is primarily on the medial and lateral aspect of the ankle.  Neurological: She is alert and oriented to person, place, and time.  Skin: Skin is warm and dry.  Nursing note and vitals reviewed.    ED Treatments / Results  Labs (all labs ordered are listed, but only abnormal results are displayed) Labs Reviewed - No data to display  EKG  EKG Interpretation None       Radiology Dg Ankle Complete Left  Result Date: 08/18/2017 CLINICAL DATA:  Left ankle pain after twisting  injury. EXAM: LEFT ANKLE COMPLETE - 3+ VIEW COMPARISON:  None. FINDINGS: An acute, transverse fracture through the medial malleolus with slight anteromedial displacement of the fracture fragment is noted with intra-articular extension into the ankle joint. Small avulsion off the tip of the medial malleolus is also seen. Moderate soft tissue swelling is seen about the fracture more so anteromedially. Base of fifth metatarsal appears intact. No joint dislocations. Subtalar joint is maintained. IMPRESSION: Acute, closed, anteromedially displaced fracture of the medial malleolus with smaller avulsed fragment off the tip of the malleolus. Moderate to marked soft tissue swelling is seen. Electronically Signed   By: Ashley Royalty M.D.   On: 08/18/2017 21:55    Procedures Procedures (including critical care time)  SPLINT APPLICATION Date/Time: 15:40  AM Authorized by: Talayeh Bruinsma Consent: Verbal consent obtained. Risks and benefits: risks, benefits and alternatives were discussed Consent given by: patient Splint applied by: orthopedic technician Location details: left ankle Splint type: posterior foot splint Supplies used: webril, ace wrap, plaster, tape Post-procedure: The splinted body part was neurovascularly unchanged following the procedure. Patient tolerance: Patient tolerated the procedure well with no immediate complications.     Medications Ordered in ED Medications  HYDROcodone-acetaminophen (NORCO/VICODIN) 5-325 MG per tablet 1 tablet (1 tablet Oral Given 08/18/17 2336)     Initial Impression / Assessment and Plan / ED Course  I have reviewed the triage vital signs and the nursing notes.  Pertinent labs & imaging results that were available during my care of the patient were reviewed by me and considered in my medical decision making (see chart for details).     Patient comes in after a fall and resultant ankle injury.  X-ray confirms distal tibial fracture, there is mild  anteromedial displacement.  Also there is a small avulsion fracture.  We will discharge patient with crutches and posterior splint, nonweightbearing status.  Also follow-up in 1 week.  Final Clinical Impressions(s) / ED Diagnoses   Final diagnoses:  Closed fracture of left ankle, initial encounter    ED Discharge Orders        Ordered    HYDROcodone-acetaminophen (NORCO/VICODIN) 5-325 MG tablet  Every 8 hours     08/19/17 0045       Varney Biles, MD 08/19/17 [phone]

## 2017-08-19 NOTE — Discharge Instructions (Signed)
You have an ankle fracture, please see the orthopedic doctor in 1 week. Keep the leg elevated, and no weightbearing for now - so use the crutches for any movement.

## 2017-08-24 ENCOUNTER — Other Ambulatory Visit: Payer: Self-pay | Admitting: Family Medicine

## 2017-09-07 NOTE — Pre-Procedure Instructions (Signed)
[name] [name]  09/07/2017      [id number] [place], [place] [address] [place] [address] Phone: [phone] Fax: [phone]    Your procedure is scheduled on Thursday February 14.  Report to [facility] Admitting at 6:00 A.M.  Call this number if you have problems the morning of surgery:  [phone]   Remember:  Do not eat food or drink liquids after midnight.  Take these medicines the morning of surgery with A SIP OF WATER:   Citalopram (Celexa) Gabapentin (neurontin) Flovent inhaler Hydrocodone-acetaminophen (Norco) OR oxycodone-acetaminophen (Percocet) if needed Albuterol if needed (please bring inhaler to hospital with you)\  7 days prior to surgery STOP taking any Aspirin(unless otherwise instructed by your surgeon), Aleve, Naproxen, Ibuprofen, Motrin, Advil, Goody's, BC's, all herbal medications, fish oil, and all vitamins    Do not wear jewelry, make-up or nail polish.  Do not wear lotions, powders, or perfumes, or deodorant.  Do not shave 48 hours prior to surgery.  Men may shave face and neck.  Do not bring valuables to the hospital.  [facility] is not responsible for any belongings or valuables.  Contacts, dentures or bridgework may not be worn into surgery.  Leave your suitcase in the car.  After surgery it may be brought to your room.  For patients admitted to the hospital, discharge time will be determined by your treatment team.  Patients discharged the day of surgery will not be allowed to drive home.    Special instructions:    [place]- Preparing For Surgery  Before surgery, you can play an important role. Because skin is not sterile, your skin needs to be as free of germs as possible. You can reduce the number of germs on your skin by washing with CHG (chlorahexidine gluconate) Soap before surgery.  CHG is an antiseptic cleaner which kills germs and  bonds with the skin to continue killing germs even after washing.  Please do not use if you have an allergy to CHG or antibacterial soaps. If your skin becomes reddened/irritated stop using the CHG.  Do not shave (including legs and underarms) for at least 48 hours prior to first CHG shower. It is OK to shave your face.  Please follow these instructions carefully.   1. Shower the NIGHT BEFORE SURGERY and the MORNING OF SURGERY with CHG.   2. If you chose to wash your hair, wash your hair first as usual with your normal shampoo.  3. After you shampoo, rinse your hair and body thoroughly to remove the shampoo.  4. Use CHG as you would any other liquid soap. You can apply CHG directly to the skin and wash gently with a scrungie or a clean washcloth.   5. Apply the CHG Soap to your body ONLY FROM THE NECK DOWN.  Do not use on open wounds or open sores. Avoid contact with your eyes, ears, mouth and genitals (private parts). Wash Face and genitals (private parts)  with your normal soap.  6. Wash thoroughly, paying special attention to the area where your surgery will be performed.  7. Thoroughly rinse your body with warm water from the neck down.  8. DO NOT shower/wash with your normal soap after using and rinsing off the CHG Soap.  9. Pat yourself dry with a CLEAN TOWEL.  10. Wear CLEAN PAJAMAS to bed the night before surgery, wear comfortable clothes the morning of surgery  11. Place CLEAN SHEETS on your bed the night of your first shower and DO NOT SLEEP WITH PETS.    Day of Surgery: Do not apply any deodorants/lotions. Please wear clean clothes to the hospital/surgery center.      Please read over the following fact sheets that you were given. Coughing and Deep Breathing and Surgical Site Infection Prevention

## 2017-09-08 ENCOUNTER — Other Ambulatory Visit: Payer: Self-pay

## 2017-09-08 ENCOUNTER — Encounter (HOSPITAL_COMMUNITY): Payer: Self-pay

## 2017-09-08 ENCOUNTER — Encounter (HOSPITAL_COMMUNITY)
Admission: RE | Admit: 2017-09-08 | Discharge: 2017-09-08 | Disposition: A | Discharge status: Home / Self Care | Payer: Medicaid Other | Source: Ambulatory Visit | Attending: Orthopedic Surgery | Admitting: Orthopedic Surgery

## 2017-09-08 DIAGNOSIS — Z01812 Encounter for preprocedural laboratory examination: Secondary | ICD-10-CM | POA: Diagnosis present

## 2017-09-08 DIAGNOSIS — Z01810 Encounter for preprocedural cardiovascular examination: Secondary | ICD-10-CM | POA: Diagnosis present

## 2017-09-08 DIAGNOSIS — I10 Essential (primary) hypertension: Secondary | ICD-10-CM | POA: Diagnosis not present

## 2017-09-08 HISTORY — DX: Essential (primary) hypertension: I10

## 2017-09-08 HISTORY — DX: Family history of other specified conditions: Z84.89

## 2017-09-08 HISTORY — DX: Headache, unspecified: R51.9

## 2017-09-08 HISTORY — DX: Fibromyalgia: M79.7

## 2017-09-08 HISTORY — DX: Headache: R51

## 2017-09-08 LAB — CBC
HCT: 39.7 % (ref 36.0–46.0)
Hemoglobin: 12.8 g/dL (ref 12.0–15.0)
MCH: 29 pg (ref 26.0–34.0)
MCHC: 32.2 g/dL (ref 30.0–36.0)
MCV: 90 fL (ref 78.0–100.0)
PLATELETS: 391 10*3/uL (ref 150–400)
RBC: 4.41 MIL/uL (ref 3.87–5.11)
RDW: 14.3 % (ref 11.5–15.5)
WBC: 5.1 10*3/uL (ref 4.0–10.5)

## 2017-09-08 LAB — SURGICAL PCR SCREEN
MRSA, PCR: NEGATIVE
Staphylococcus aureus: POSITIVE — AB

## 2017-09-08 LAB — BASIC METABOLIC PANEL
Anion gap: 10 (ref 5–15)
BUN: 9 mg/dL (ref 6–20)
CO2: 24 mmol/L (ref 22–32)
CREATININE: 0.7 mg/dL (ref 0.44–1.00)
Calcium: 9.3 mg/dL (ref 8.9–10.3)
Chloride: 101 mmol/L (ref 101–111)
GFR calc Af Amer: >60 mL/min (ref >60)
Glucose, Bld: 94 mg/dL (ref 65–99)
Potassium: 3.5 mmol/L (ref 3.5–5.1)
SODIUM: 135 mmol/L (ref 135–145)

## 2017-09-09 NOTE — H&P (Signed)
Orthopaedic Trauma Service (OTS) Consult   Patient ID: [name] [name] MRN: [id number] DOB/AGE: 04-Jul-1976 42 y.o.    HPI: [name] [name] is an 42 y.o. female with hx of cerebral palsy who fell while playing with her children. Seen and evaluated in ED, found to have medial malleolus fx with mortise widening. Seen as an outpt in office. Medial malleolus fracture and proximal fibula fracture noted. Pt presents today for fixation of her ankle fracture   Past Medical History:  Diagnosis Date  . Abnormal Pap smear   . Asthma   . Cerebral palsy ([place])   . Depression    no medication treatment   . Family history of adverse reaction to anesthesia    MOTHER  HARD TIME WAKING UP  . Fibroid   . Fibromyalgia   . Fibromyalgia syndrome   . Headache    HX MIGRAINES  . Hypertension   . 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 CESAREAN SECTION  03/20/2011   Procedure: CESAREAN SECTION;  Surgeon: Blane Ohara Meisinger;  Location: [facility];  Service: Gynecology;  Laterality: N/A;  Primary Female at 38  . MOUTH SURGERY     42 years old  . MYOMECTOMY  03/20/2011   Procedure: MYOMECTOMY;  Surgeon: Blane Ohara Meisinger;  Location: [facility];  Service: Gynecology;  Laterality: N/A;    Family History  Problem Relation Age of Onset  . Birth defects Cousin        Down Syndrome  . Anesthesia problems Mother     Social History:  reports that  has never smoked. she has never used smokeless tobacco. She reports that she drinks about 0.6 oz of alcohol per week. She reports that she does not use drugs.  Allergies:  Allergies  Allergen Reactions  . Pineapple Rash and Other (See Comments)     Blistering of the mouth  . Tomato Rash and Other (See Comments)    Blistering of the mouth  . Hydrocodone Nausea And Vomiting    Medications:  I have reviewed the patient's current medications. Prior to Admission:  No  medications prior to admission.   Current Meds  Medication Sig  . albuterol (PROVENTIL HFA;VENTOLIN HFA) 108 (90 Base) MCG/ACT inhaler USE 2 PUFFS INTO THE LUNGS EVERY 6 (SIX) HOURS AS NEEDED FOR WHEEZING.  Marland Kitchen aspirin EC 325 MG tablet Take 650 mg by mouth 2 (two) times daily as needed for moderate pain.  . citalopram (CELEXA) 20 MG tablet Take 1 tablet (20 mg total) by mouth daily.  . diphenhydrAMINE (BENADRYL) 25 MG tablet Take 50 mg by mouth daily as needed for allergies.  . diphenhydramine-acetaminophen (TYLENOL PM EXTRA STRENGTH) 25-500 MG TABS tablet Take 2 tablets by mouth at bedtime as needed (sleep/pain).  . fluticasone (FLOVENT HFA) 110 MCG/ACT inhaler Inhale 2 puffs into the lungs 2 (two) times daily.  Marland Kitchen gabapentin (NEURONTIN) 100 MG capsule 2 capsules PO twice daily (Patient taking differently: Take 200 mg by mouth 2 (two) times daily. )  . lisinopril-hydrochlorothiazide (ZESTORETIC) 20-12.5 MG tablet Take 1 tablet by mouth daily.  Marland Kitchen oxyCODONE-acetaminophen (PERCOCET/ROXICET) 5-325 MG tablet Take 1-2 tablets by mouth every 8 (eight) hours as needed for pain.     Results for orders placed or performed during the hospital encounter of 09/08/17 (from the past 48 hour(s))  Surgical pcr screen     Status: Abnormal   Collection Time:  09/08/17 10:56 AM  Result Value Ref Range   MRSA, PCR NEGATIVE NEGATIVE   Staphylococcus aureus POSITIVE (A) NEGATIVE    Comment: (NOTE) The Xpert SA Assay (FDA approved for NASAL specimens in patients 39 years of age and older), is one component of a comprehensive surveillance program. It is not intended to diagnose infection nor to guide or monitor treatment. Performed at [facility], [place] [address], [place] [address]   CBC     Status: None   Collection Time: 09/08/17 10:57 AM  Result Value Ref Range   WBC 5.1 4.0 - 10.5 K/uL   RBC 4.41 3.87 - 5.11 MIL/uL   Hemoglobin 12.8 12.0 - 15.0 g/dL   HCT 39.7 36.0 - 46.0 %   MCV 90.0  78.0 - 100.0 fL   MCH 29.0 26.0 - 34.0 pg   MCHC 32.2 30.0 - 36.0 g/dL   RDW 14.3 11.5 - 15.5 %   Platelets 391 150 - 400 K/uL    Comment: Performed at [facility], [place] [address] [place], [place] [address]  Basic metabolic panel     Status: None   Collection Time: 09/08/17 10:57 AM  Result Value Ref Range   Sodium 135 135 - 145 mmol/L   Potassium 3.5 3.5 - 5.1 mmol/L   Chloride 101 101 - 111 mmol/L   CO2 24 22 - 32 mmol/L   Glucose, Bld 94 65 - 99 mg/dL   BUN 9 6 - 20 mg/dL   Creatinine, Ser 0.70 0.44 - 1.00 mg/dL   Calcium 9.3 8.9 - 10.3 mg/dL   GFR calc non Af Amer >60 >60 mL/min   GFR calc Af Amer >60 >60 mL/min    Comment: (NOTE) The eGFR has been calculated using the CKD EPI equation. This calculation has not been validated in all clinical situations. eGFR's persistently <60 mL/min signify possible Chronic Kidney Disease.    Anion gap 10 5 - 15    Comment: Performed at [facility] [address], [place], [place] [address]    Vitals on arrival to short stay   Review of Systems  Constitutional: Negative for chills and fever.  Respiratory: Negative for shortness of breath and wheezing.   Cardiovascular: Negative for chest pain and palpitations.  Gastrointestinal: Negative for nausea and vomiting.   Last menstrual period 09/07/2017. Physical Exam  Constitutional: She is oriented to person, place, and time. She appears well-developed and well-nourished.  Cardiovascular: Normal rate and regular rhythm.  Pulmonary/Chest: Effort normal and breath sounds normal.  Musculoskeletal:  Left Lower Extremity  Swelling controlled to L ankle  Skin wrinkles with gentle compression Motor and sensory functions at baseline function  Ext warm  + DP pulse + TTP over ankle and knee (fibula) No DCT  Compartments are soft   Neurological: She is alert and oriented to person, place, and time.  Skin: Skin is warm and dry.     Assessment/Plan:   42 y/o female with  Maisonneuve type Left ankle fracture, cerebral palsy   -L maisonneuve ankle fracture   OR for ORIF medial malleolus and syndesmosis  NWB x 8 weeks  Outpatient vs overnight observation   Risks and benefits reviewed. Pt wishes to proceed   - Pain management:  Titrate accordingly   - Medical issues   Resume home meds post op  - DVT/PE prophylaxis:  lovenox x 14 days  - ID:   periop abx    - Dispo:  OR for fixation of L ankle    Jari Pigg, PA-C [facility] [phone] (C) [phone] (O) 09/09/2017, 5:00 PM

## 2017-09-10 ENCOUNTER — Ambulatory Visit: Payer: Self-pay | Admitting: Obstetrics and Gynecology

## 2017-09-10 ENCOUNTER — Encounter (HOSPITAL_COMMUNITY)
Admission: RE | Disposition: A | Discharge status: Home / Self Care | Payer: Self-pay | Source: Ambulatory Visit | Attending: Orthopedic Surgery

## 2017-09-10 ENCOUNTER — Ambulatory Visit (HOSPITAL_COMMUNITY): Payer: Medicaid Other

## 2017-09-10 ENCOUNTER — Encounter (HOSPITAL_COMMUNITY): Payer: Self-pay | Admitting: Certified Registered Nurse Anesthetist

## 2017-09-10 ENCOUNTER — Ambulatory Visit (HOSPITAL_COMMUNITY)
Admission: RE | Admit: 2017-09-10 | Discharge: 2017-09-10 | Disposition: A | Discharge status: Home / Self Care | Payer: Medicaid Other | Source: Ambulatory Visit | Attending: Orthopedic Surgery | Admitting: Orthopedic Surgery

## 2017-09-10 ENCOUNTER — Ambulatory Visit (HOSPITAL_COMMUNITY): Payer: Medicaid Other | Admitting: Certified Registered Nurse Anesthetist

## 2017-09-10 DIAGNOSIS — W19XXXA Unspecified fall, initial encounter: Secondary | ICD-10-CM | POA: Diagnosis not present

## 2017-09-10 DIAGNOSIS — Z7982 Long term (current) use of aspirin: Secondary | ICD-10-CM | POA: Insufficient documentation

## 2017-09-10 DIAGNOSIS — J45909 Unspecified asthma, uncomplicated: Secondary | ICD-10-CM | POA: Insufficient documentation

## 2017-09-10 DIAGNOSIS — T148XXA Other injury of unspecified body region, initial encounter: Secondary | ICD-10-CM

## 2017-09-10 DIAGNOSIS — I10 Essential (primary) hypertension: Secondary | ICD-10-CM | POA: Insufficient documentation

## 2017-09-10 DIAGNOSIS — Z7951 Long term (current) use of inhaled steroids: Secondary | ICD-10-CM | POA: Diagnosis not present

## 2017-09-10 DIAGNOSIS — G809 Cerebral palsy, unspecified: Secondary | ICD-10-CM | POA: Insufficient documentation

## 2017-09-10 DIAGNOSIS — M797 Fibromyalgia: Secondary | ICD-10-CM | POA: Diagnosis not present

## 2017-09-10 DIAGNOSIS — Z91018 Allergy to other foods: Secondary | ICD-10-CM | POA: Diagnosis not present

## 2017-09-10 DIAGNOSIS — Z885 Allergy status to narcotic agent status: Secondary | ICD-10-CM | POA: Diagnosis not present

## 2017-09-10 DIAGNOSIS — Z8279 Family history of other congenital malformations, deformations and chromosomal abnormalities: Secondary | ICD-10-CM | POA: Diagnosis not present

## 2017-09-10 DIAGNOSIS — H9191 Unspecified hearing loss, right ear: Secondary | ICD-10-CM | POA: Insufficient documentation

## 2017-09-10 DIAGNOSIS — S93432A Sprain of tibiofibular ligament of left ankle, initial encounter: Secondary | ICD-10-CM | POA: Insufficient documentation

## 2017-09-10 DIAGNOSIS — F329 Major depressive disorder, single episode, unspecified: Secondary | ICD-10-CM | POA: Insufficient documentation

## 2017-09-10 DIAGNOSIS — Z8489 Family history of other specified conditions: Secondary | ICD-10-CM | POA: Insufficient documentation

## 2017-09-10 DIAGNOSIS — Z419 Encounter for procedure for purposes other than remedying health state, unspecified: Secondary | ICD-10-CM

## 2017-09-10 DIAGNOSIS — Z79899 Other long term (current) drug therapy: Secondary | ICD-10-CM | POA: Insufficient documentation

## 2017-09-10 DIAGNOSIS — S8252XA Displaced fracture of medial malleolus of left tibia, initial encounter for closed fracture: Secondary | ICD-10-CM | POA: Diagnosis not present

## 2017-09-10 DIAGNOSIS — Z9889 Other specified postprocedural states: Secondary | ICD-10-CM | POA: Insufficient documentation

## 2017-09-10 HISTORY — PX: ORIF ANKLE FRACTURE: SHX5408

## 2017-09-10 LAB — POCT PREGNANCY, URINE: PREG TEST UR: NEGATIVE

## 2017-09-10 SURGERY — OPEN REDUCTION INTERNAL FIXATION (ORIF) ANKLE FRACTURE
Anesthesia: Regional | Site: Ankle | Laterality: Left

## 2017-09-10 MED ORDER — ROCURONIUM BROMIDE 50 MG/5ML IV SOSY
PREFILLED_SYRINGE | INTRAVENOUS | Status: DC | PRN
  Administered 2017-09-10: 20 mg via INTRAVENOUS
  Administered 2017-09-10: 50 mg via INTRAVENOUS
  Administered 2017-09-10 (×2): 10 mg via INTRAVENOUS

## 2017-09-10 MED ORDER — ONDANSETRON HCL 4 MG/2ML IJ SOLN
INTRAMUSCULAR | Status: AC
  Filled 2017-09-10: qty 2

## 2017-09-10 MED ORDER — CEFAZOLIN SODIUM-DEXTROSE 2-4 GM/100ML-% IV SOLN
2.0000 g | INTRAVENOUS | Status: AC
  Administered 2017-09-10: 2 g via INTRAVENOUS
  Filled 2017-09-10: qty 100

## 2017-09-10 MED ORDER — DEXAMETHASONE SODIUM PHOSPHATE 10 MG/ML IJ SOLN
INTRAMUSCULAR | Status: DC | PRN
  Administered 2017-09-10: 10 mg via INTRAVENOUS

## 2017-09-10 MED ORDER — PHENYLEPHRINE 40 MCG/ML (10ML) SYRINGE FOR IV PUSH (FOR BLOOD PRESSURE SUPPORT)
PREFILLED_SYRINGE | INTRAVENOUS | Status: AC
  Filled 2017-09-10: qty 10

## 2017-09-10 MED ORDER — FENTANYL CITRATE (PF) 100 MCG/2ML IJ SOLN
INTRAMUSCULAR | Status: DC | PRN
  Administered 2017-09-10 (×5): 50 ug via INTRAVENOUS

## 2017-09-10 MED ORDER — MIDAZOLAM HCL 2 MG/2ML IJ SOLN
2.0000 mg | Freq: Once | INTRAMUSCULAR | Status: AC
  Administered 2017-09-10: 2 mg via INTRAVENOUS
  Filled 2017-09-10: qty 2

## 2017-09-10 MED ORDER — PHENYLEPHRINE 40 MCG/ML (10ML) SYRINGE FOR IV PUSH (FOR BLOOD PRESSURE SUPPORT)
PREFILLED_SYRINGE | INTRAVENOUS | Status: DC | PRN
  Administered 2017-09-10 (×2): 80 ug via INTRAVENOUS

## 2017-09-10 MED ORDER — PROPOFOL 10 MG/ML IV BOLUS
INTRAVENOUS | Status: AC
  Filled 2017-09-10: qty 40

## 2017-09-10 MED ORDER — OXYCODONE HCL 5 MG PO TABS: 5.0000 mg | ORAL_TABLET | Freq: Four times a day (QID) | ORAL | 0 refills | Status: DC | PRN

## 2017-09-10 MED ORDER — SUGAMMADEX SODIUM 200 MG/2ML IV SOLN
INTRAVENOUS | Status: DC | PRN
  Administered 2017-09-10: 200 mg via INTRAVENOUS

## 2017-09-10 MED ORDER — ONDANSETRON 4 MG PO TBDP: 4.0000 mg | ORAL_TABLET | Freq: Three times a day (TID) | ORAL | 0 refills | Status: DC | PRN

## 2017-09-10 MED ORDER — EPHEDRINE SULFATE-NACL 50-0.9 MG/10ML-% IV SOSY
PREFILLED_SYRINGE | INTRAVENOUS | Status: DC | PRN
  Administered 2017-09-10: 10 mg via INTRAVENOUS

## 2017-09-10 MED ORDER — EPHEDRINE 5 MG/ML INJ
INTRAVENOUS | Status: AC
  Filled 2017-09-10: qty 10

## 2017-09-10 MED ORDER — FENTANYL CITRATE (PF) 100 MCG/2ML IJ SOLN
100.0000 ug | Freq: Once | INTRAMUSCULAR | Status: AC
  Administered 2017-09-10: 50 ug via INTRAVENOUS
  Filled 2017-09-10: qty 2

## 2017-09-10 MED ORDER — ALBUTEROL SULFATE HFA 108 (90 BASE) MCG/ACT IN AERS
INHALATION_SPRAY | RESPIRATORY_TRACT | Status: AC
  Filled 2017-09-10: qty 6.7

## 2017-09-10 MED ORDER — HYDROMORPHONE HCL 1 MG/ML IJ SOLN: 0.2500 mg | INTRAMUSCULAR | Status: DC | PRN

## 2017-09-10 MED ORDER — ROPIVACAINE HCL 5 MG/ML IJ SOLN
INTRAMUSCULAR | Status: DC | PRN
  Administered 2017-09-10: 50 mL via PERINEURAL

## 2017-09-10 MED ORDER — ONDANSETRON HCL 4 MG/2ML IJ SOLN
INTRAMUSCULAR | Status: DC | PRN
  Administered 2017-09-10: 4 mg via INTRAVENOUS

## 2017-09-10 MED ORDER — DEXAMETHASONE SODIUM PHOSPHATE 10 MG/ML IJ SOLN
INTRAMUSCULAR | Status: AC
  Filled 2017-09-10: qty 1

## 2017-09-10 MED ORDER — MEPERIDINE HCL 50 MG/ML IJ SOLN: 6.2500 mg | INTRAMUSCULAR | Status: DC | PRN

## 2017-09-10 MED ORDER — OXYCODONE HCL 5 MG/5ML PO SOLN: 5.0000 mg | Freq: Once | ORAL | Status: DC | PRN

## 2017-09-10 MED ORDER — LIDOCAINE 2% (20 MG/ML) 5 ML SYRINGE
INTRAMUSCULAR | Status: AC
  Filled 2017-09-10: qty 5

## 2017-09-10 MED ORDER — FENTANYL CITRATE (PF) 250 MCG/5ML IJ SOLN
INTRAMUSCULAR | Status: AC
  Filled 2017-09-10: qty 5

## 2017-09-10 MED ORDER — ROCURONIUM BROMIDE 10 MG/ML (PF) SYRINGE
PREFILLED_SYRINGE | INTRAVENOUS | Status: AC
  Filled 2017-09-10: qty 5

## 2017-09-10 MED ORDER — PROMETHAZINE HCL 25 MG/ML IJ SOLN: 6.2500 mg | INTRAMUSCULAR | Status: DC | PRN

## 2017-09-10 MED ORDER — LIDOCAINE 2% (20 MG/ML) 5 ML SYRINGE
INTRAMUSCULAR | Status: DC | PRN
  Administered 2017-09-10: 100 mg via INTRAVENOUS

## 2017-09-10 MED ORDER — PROPOFOL 10 MG/ML IV BOLUS
INTRAVENOUS | Status: DC | PRN
  Administered 2017-09-10: 150 mg via INTRAVENOUS

## 2017-09-10 MED ORDER — CHLORHEXIDINE GLUCONATE 4 % EX LIQD: 60.0000 mL | Freq: Once | CUTANEOUS | Status: DC

## 2017-09-10 MED ORDER — LACTATED RINGERS IV SOLN
INTRAVENOUS | Status: DC | PRN
  Administered 2017-09-10: 07:00:00 via INTRAVENOUS
  Administered 2017-09-10: 1000 mL
  Administered 2017-09-10: 09:00:00 via INTRAVENOUS

## 2017-09-10 MED ORDER — OXYCODONE-ACETAMINOPHEN 5-325 MG PO TABS: 1.0000 | ORAL_TABLET | Freq: Three times a day (TID) | ORAL | 0 refills | Status: DC | PRN

## 2017-09-10 MED ORDER — 0.9 % SODIUM CHLORIDE (POUR BTL) OPTIME
TOPICAL | Status: DC | PRN
  Administered 2017-09-10: 1000 mL

## 2017-09-10 MED ORDER — OXYCODONE HCL 5 MG PO TABS: 5.0000 mg | ORAL_TABLET | Freq: Once | ORAL | Status: DC | PRN

## 2017-09-10 MED ORDER — ALBUTEROL SULFATE HFA 108 (90 BASE) MCG/ACT IN AERS
INHALATION_SPRAY | RESPIRATORY_TRACT | Status: DC | PRN
  Administered 2017-09-10: 2 via RESPIRATORY_TRACT

## 2017-09-10 MED ORDER — SUGAMMADEX SODIUM 200 MG/2ML IV SOLN
INTRAVENOUS | Status: AC
  Filled 2017-09-10: qty 2

## 2017-09-10 SURGICAL SUPPLY — 76 items
ANCHOR JUGGERLOC W/DRL 2.9/3.5 (BIT) ×2 IMPLANT
BANDAGE ACE 4X5 VEL STRL LF (GAUZE/BANDAGES/DRESSINGS) ×2 IMPLANT
BANDAGE ACE 6X5 VEL STRL LF (GAUZE/BANDAGES/DRESSINGS) ×2 IMPLANT
BANDAGE ESMARK 6X9 LF (GAUZE/BANDAGES/DRESSINGS) ×1 IMPLANT
BIT DRILL 2.4X140 LONG SOLID (BIT) ×2 IMPLANT
BIT DRILL SOLID 2.0 X 110MM (DRILL) IMPLANT
BNDG CMPR 9X6 STRL LF SNTH (GAUZE/BANDAGES/DRESSINGS) ×1
BNDG ESMARK 6X9 LF (GAUZE/BANDAGES/DRESSINGS) ×3
BNDG GAUZE ELAST 4 BULKY (GAUZE/BANDAGES/DRESSINGS) ×6 IMPLANT
BRUSH SCRUB SURG 4.25 DISP (MISCELLANEOUS) ×6 IMPLANT
COVER MAYO STAND STRL (DRAPES) ×3 IMPLANT
COVER SURGICAL LIGHT HANDLE (MISCELLANEOUS) ×3 IMPLANT
DRAPE C-ARM 42X72 X-RAY (DRAPES) IMPLANT
DRAPE C-ARMOR (DRAPES) ×6 IMPLANT
DRAPE HALF SHEET 40X57 (DRAPES) ×3 IMPLANT
DRAPE U-SHAPE 47X51 STRL (DRAPES) ×3 IMPLANT
DRILL SOLID 2.0 X 110MM (DRILL) ×3
DRSG ADAPTIC 3X8 NADH LF (GAUZE/BANDAGES/DRESSINGS) ×2 IMPLANT
DRSG EMULSION OIL 3X3 NADH (GAUZE/BANDAGES/DRESSINGS) IMPLANT
ELECT REM PT RETURN 9FT ADLT (ELECTROSURGICAL) ×3
ELECTRODE REM PT RTRN 9FT ADLT (ELECTROSURGICAL) ×1 IMPLANT
FIXATION ZIPTIGHT ANKLE SNDSMS (Ankle) IMPLANT
GAUZE SPONGE 4X4 12PLY STRL (GAUZE/BANDAGES/DRESSINGS) IMPLANT
GLOVE BIO SURGEON STRL SZ7.5 (GLOVE) ×3 IMPLANT
GLOVE BIO SURGEON STRL SZ8 (GLOVE) ×3 IMPLANT
GLOVE BIOGEL PI IND STRL 7.5 (GLOVE) ×1 IMPLANT
GLOVE BIOGEL PI IND STRL 8 (GLOVE) ×1 IMPLANT
GLOVE BIOGEL PI INDICATOR 7.5 (GLOVE) ×2
GLOVE BIOGEL PI INDICATOR 8 (GLOVE) ×2
GLOVE INDICATOR 8.0 STRL GRN (GLOVE) ×4 IMPLANT
GOWN STRL REUS W/ TWL LRG LVL3 (GOWN DISPOSABLE) ×2 IMPLANT
GOWN STRL REUS W/ TWL XL LVL3 (GOWN DISPOSABLE) ×1 IMPLANT
GOWN STRL REUS W/TWL LRG LVL3 (GOWN DISPOSABLE) ×6
GOWN STRL REUS W/TWL XL LVL3 (GOWN DISPOSABLE) ×3
JuggerLoc Bone to Bone 2.9 Drill bit, 3.5 Drill Bi ×4 IMPLANT
K-WIRE SMOOTH TROCAR 2.0X150 (WIRE) ×3
K-WIRE SNGL END 1.2X150 (MISCELLANEOUS) ×3
KIT BASIN OR (CUSTOM PROCEDURE TRAY) ×3 IMPLANT
KIT ROOM TURNOVER OR (KITS) ×3 IMPLANT
KWIRE SMOOTH TROCAR 2.0X150 (WIRE) IMPLANT
KWIRE SNGL END 1.2X150 (MISCELLANEOUS) IMPLANT
MANIFOLD NEPTUNE II (INSTRUMENTS) ×3 IMPLANT
NDL HYPO 21X1.5 SAFETY (NEEDLE) IMPLANT
NEEDLE HYPO 21X1.5 SAFETY (NEEDLE) IMPLANT
NS IRRIG 1000ML POUR BTL (IV SOLUTION) ×3 IMPLANT
PACK GENERAL/GYN (CUSTOM PROCEDURE TRAY) ×3 IMPLANT
PACK ORTHO EXTREMITY (CUSTOM PROCEDURE TRAY) ×3 IMPLANT
PAD ARMBOARD 7.5X6 YLW CONV (MISCELLANEOUS) ×6 IMPLANT
PAD CAST 4YDX4 CTTN HI CHSV (CAST SUPPLIES) IMPLANT
PADDING CAST COTTON 4X4 STRL (CAST SUPPLIES) ×3
PADDING CAST COTTON 6X4 STRL (CAST SUPPLIES) ×2 IMPLANT
PLATE ACE 3.5MM 2HOLE (Plate) ×2 IMPLANT
PLATE MEDIAL MALLEOLUS 4H HOOK (Plate) ×2 IMPLANT
SCREW 3.5X26 NONLOCKING (Screw) ×2 IMPLANT
SCREW CANN 4.0X30 (Screw) ×4 IMPLANT
SCREW CORTICAL 3.5MM  34MM (Screw) ×2 IMPLANT
SCREW CORTICAL 3.5MM 34MM (Screw) IMPLANT
SCREW LOCK PLATE R3 2.7X8 (Screw) ×2 IMPLANT
SCREW RE3CON NL 3.5X30 (Screw) ×2 IMPLANT
SPLINT PLASTER CAST XFAST 5X30 (CAST SUPPLIES) IMPLANT
SPLINT PLASTER XFAST SET 5X30 (CAST SUPPLIES) ×2
SPONGE LAP 18X18 X RAY DECT (DISPOSABLE) ×3 IMPLANT
STAPLER VISISTAT 35W (STAPLE) IMPLANT
SUCTION FRAZIER HANDLE 10FR (MISCELLANEOUS) ×2
SUCTION TUBE FRAZIER 10FR DISP (MISCELLANEOUS) ×1 IMPLANT
SUT ETHILON 3 0 PS 1 (SUTURE) ×6 IMPLANT
SUT PDS AB 2-0 CT1 27 (SUTURE) IMPLANT
SUT VIC AB 2-0 CT1 27 (SUTURE) ×6
SUT VIC AB 2-0 CT1 TAPERPNT 27 (SUTURE) ×2 IMPLANT
TOWEL OR 17X24 6PK STRL BLUE (TOWEL DISPOSABLE) ×3 IMPLANT
TOWEL OR 17X26 10 PK STRL BLUE (TOWEL DISPOSABLE) ×6 IMPLANT
TUBE CONNECTING 12'X1/4 (SUCTIONS) ×1
TUBE CONNECTING 12X1/4 (SUCTIONS) ×2 IMPLANT
UNDERPAD 30X30 (UNDERPADS AND DIAPERS) ×3 IMPLANT
WATER STERILE IRR 1000ML POUR (IV SOLUTION) ×3 IMPLANT
ZIPTIGHT ANKLE SYNODESMOSS FIX (Ankle) ×3 IMPLANT

## 2017-09-10 NOTE — Anesthesia Procedure Notes (Signed)
Procedure Name: Intubation Date/Time: 09/10/2017 8:17 AM Performed by: Genelle Bal, CRNA Pre-anesthesia Checklist: Patient identified, Emergency Drugs available, Suction available and Patient being monitored Patient Re-evaluated:Patient Re-evaluated prior to induction Oxygen Delivery Method: Circle system utilized Preoxygenation: Pre-oxygenation with 100% oxygen Induction Type: IV induction Ventilation: Mask ventilation without difficulty Laryngoscope Size: Miller and 2 Grade View: Grade I Tube type: Oral Tube size: 7.0 mm Number of attempts: 1 Airway Equipment and Method: Stylet and Oral airway Placement Confirmation: ETT inserted through vocal cords under direct vision,  positive ETCO2 and breath sounds checked- equal and bilateral Secured at: 21 cm Tube secured with: Tape Dental Injury: Teeth and Oropharynx as per pre-operative assessment

## 2017-09-10 NOTE — Brief Op Note (Signed)
09/10/2017  11:36 AM  PATIENT:  [name] [name]  41 y.o. female  PRE-OPERATIVE DIAGNOSIS:  1. LEFT MEDIAL MALLEOLUS FRACTURE 2. LEFT ANKLE SYNDESMOSIS DISRUPTION  POST-OPERATIVE DIAGNOSIS:   1. LEFT MEDIAL MALLEOLUS FRACTURE 2. LEFT ANKLE SYNDESMOSIS DISRUPTION  PROCEDURE:  Procedure(s): 1. OPEN REDUCTION INTERNAL FIXATION (ORIF) LEFT MEDIAL MALLEOLUS ANKLE FRACTURE  2. OPEN REDUCTION INTERNAL FIXATION (ORIF) LEFT SYNDESMOSIS (Left) 3. STRESS FLOURO LEFT ANKLE SYNDESMOSIS  SURGEON:  Surgeon(s) and Role:    * Altamese Vanderbilt, MD - Primary  PHYSICIAN ASSISTANT: Ainsley Spinner, PA-C  ANESTHESIA:   general and regional block  EBL:  20 mL   BLOOD ADMINISTERED:none  DRAINS: none   LOCAL MEDICATIONS USED:  OTHER PER ANESTH  SPECIMEN:  No Specimen  DISPOSITION OF SPECIMEN:  N/A  COUNTS:  YES  TOURNIQUET:  * No tourniquets in log *  DICTATION: .Other Dictation: Dictation Number [id number]  PLAN OF CARE: Discharge to home after PACU  PATIENT DISPOSITION:  PACU - hemodynamically stable.   Delay start of Pharmacological VTE agent (>24hrs) due to surgical blood loss or risk of bleeding: no

## 2017-09-10 NOTE — Anesthesia Postprocedure Evaluation (Signed)
Anesthesia Post Note  Patient: [name] [name]  Procedure(s) Performed: OPEN REDUCTION INTERNAL FIXATION (ORIF) LEFT MALLEOLUS ANKLE FRACTURE AND SYNDESMOSIS (Left Ankle)     Patient location during evaluation: PACU Anesthesia Type: Regional and General Level of consciousness: awake and alert Pain management: pain level controlled Vital Signs Assessment: post-procedure vital signs reviewed and stable Respiratory status: spontaneous breathing, nonlabored ventilation and respiratory function stable Cardiovascular status: blood pressure returned to baseline and stable Postop Assessment: no apparent nausea or vomiting Anesthetic complications: no    Last Vitals:  Vitals:   09/10/17 1141 09/10/17 1156  BP: (!) 150/92 122/76  Pulse: (!) 103 96  Resp: 19 19  Temp:  36.7 C  SpO2: 96% 95%    Last Pain:  Vitals:   09/10/17 1156  PainSc: 0-No pain                 Lynda Rainwater

## 2017-09-10 NOTE — Discharge Instructions (Addendum)
Orthopaedic Trauma Service Discharge Instructions   General Discharge Instructions  WEIGHT BEARING STATUS: Nonweightbearing Left Leg  RANGE OF MOTION/ACTIVITY: Do not remove splint. Ok to move toes  Wound Care: DO NOT GET SPLINT WET. IF IT GETS SATURATED CALL OFFICE IMMEDIATELY    Diet: as you were eating previously.  Can use over the counter stool softeners and bowel preparations, such as Miralax, to help with bowel movements.  Narcotics can be constipating.  Be sure to drink plenty of fluids  PAIN MEDICATION USE AND EXPECTATIONS  You have likely been given narcotic medications to help control your pain.  After a traumatic event that results in an fracture (broken bone) with or without surgery, it is ok to use narcotic pain medications to help control one's pain.  We understand that everyone responds to pain differently and each individual patient will be evaluated on a regular basis for the continued need for narcotic medications. Ideally, narcotic medication use should last no more than 6-8 weeks (coinciding with fracture healing).   As a patient it is your responsibility as well to monitor narcotic medication use and report the amount and frequency you use these medications when you come to your office visit.   We would also advise that if you are using narcotic medications, you should take a dose prior to therapy to maximize you participation.  IF YOU ARE ON NARCOTIC MEDICATIONS IT IS NOT PERMISSIBLE TO OPERATE A MOTOR VEHICLE (MOTORCYCLE/CAR/TRUCK/MOPED) OR HEAVY MACHINERY DO NOT MIX NARCOTICS WITH OTHER CNS (CENTRAL NERVOUS SYSTEM) DEPRESSANTS SUCH AS ALCOHOL   STOP SMOKING OR USING NICOTINE PRODUCTS!!!!  As discussed nicotine severely impairs your body's ability to heal surgical and traumatic wounds but also impairs bone healing.  Wounds and bone heal by forming microscopic blood vessels (angiogenesis) and nicotine is a vasoconstrictor (essentially, shrinks blood vessels).  Therefore,  if vasoconstriction occurs to these microscopic blood vessels they essentially disappear and are unable to deliver necessary nutrients to the healing tissue.  This is one modifiable factor that you can do to dramatically increase your chances of healing your injury.    (This means no smoking, no nicotine gum, patches, etc)  DO NOT USE NONSTEROIDAL ANTI-INFLAMMATORY DRUGS (NSAID'S)  Using products such as Advil (ibuprofen), Aleve (naproxen), Motrin (ibuprofen) for additional pain control during fracture healing can delay and/or prevent the healing response.  If you would like to take over the counter (OTC) medication, Tylenol (acetaminophen) is ok.  However, some narcotic medications that are given for pain control contain acetaminophen as well. Therefore, you should not exceed more than 4000 mg of tylenol in a day if you do not have liver disease.  Also note that there are may OTC medicines, such as cold medicines and allergy medicines that my contain tylenol as well.  If you have any questions about medications and/or interactions please ask your doctor/PA or your pharmacist.      ICE AND ELEVATE INJURED/OPERATIVE EXTREMITY  Using ice and elevating the injured extremity above your heart can help with swelling and pain control.  Icing in a pulsatile fashion, such as 20 minutes on and 20 minutes off, can be followed.    Do not place ice directly on skin. Make sure there is a barrier between to skin and the ice pack.    Using frozen items such as frozen peas works well as the conform nicely to the are that needs to be iced.  USE AN ACE WRAP OR TED HOSE FOR SWELLING CONTROL  In addition to icing and elevation, Ace wraps or TED hose are used to help limit and resolve swelling.  It is recommended to use Ace wraps or TED hose until you are informed to stop.    When using Ace Wraps start the wrapping distally (farthest away from the body) and wrap proximally (closer to the body)   Example: If you had surgery  on your leg or thing and you do not have a splint on, start the ace wrap at the toes and work your way up to the thigh        If you had surgery on your upper extremity and do not have a splint on, start the ace wrap at your fingers and work your way up to the upper arm  IF YOU ARE IN A SPLINT OR CAST DO NOT [place]   If your splint gets wet for any reason please contact the office immediately. You may shower in your splint or cast as long as you keep it dry.  This can be done by wrapping in a cast cover or garbage back (or similar)  Do Not stick any thing down your splint or cast such as pencils, money, or hangers to try and scratch yourself with.  If you feel itchy take benadryl as prescribed on the bottle for itching  IF YOU ARE IN A CAM BOOT (BLACK BOOT)  You may remove boot periodically. Perform daily dressing changes as noted below.  Wash the liner of the boot regularly and wear a sock when wearing the boot. It is recommended that you sleep in the boot until told otherwise  CALL THE OFFICE WITH ANY QUESTIONS OR CONCERNS: [phone]

## 2017-09-10 NOTE — Anesthesia Procedure Notes (Signed)
Anesthesia Regional Block: Popliteal block   Pre-Anesthetic Checklist: ,, timeout performed, Correct Patient, Correct Site, Correct Laterality, Correct Procedure, Correct Position, site marked, Risks and benefits discussed,  Surgical consent,  Pre-op evaluation,  At surgeon's request and post-op pain management  Laterality: Left  Prep: chloraprep       Needles:  Injection technique: Single-shot  Needle Type: Stimiplex     Needle Length: 9cm  Needle Gauge: 21     Additional Needles:   Procedures:,,,, ultrasound used (permanent image in chart),,,,  Narrative:  Start time: 09/10/2017 7:52 AM End time: 09/10/2017 7:57 AM Injection made incrementally with aspirations every 5 mL.  Performed by: Personally  Anesthesiologist: Lynda Rainwater, MD       20 ml 0.5%Ropivacaine in adductor canal space in addition to popliteal block

## 2017-09-10 NOTE — OR Nursing (Signed)
Patient came to the short stay admitting area with her splint and dressing on the lower left leg soaked in some type of fluid. Her skin was macerated on entire area covered by dressing, particularly on bottom of foot.  Dr. Marcelino Scot dried and wiped area of skin with surgical towel prior to prepping and draping.

## 2017-09-10 NOTE — Anesthesia Preprocedure Evaluation (Addendum)
Anesthesia Evaluation  Patient identified by MRN, date of birth, ID band Patient awake    Reviewed: Allergy & Precautions, H&P , NPO status , Patient's Chart, lab work & pertinent test results  Airway Mallampati: III  TM Distance: >3 FB Neck ROM: full    Dental no notable dental hx. (+) Partial Lower, Loose, Lower Dentures, Poor Dentition   Pulmonary    Pulmonary exam normal breath sounds clear to auscultation       Cardiovascular hypertension, negative cardio ROS Normal cardiovascular exam Rhythm:Regular Rate:Normal     Neuro/Psych  Headaches, Depression  Neuromuscular disease    GI/Hepatic negative GI ROS, Neg liver ROS,   Endo/Other  negative endocrine ROS  Renal/GU negative Renal ROS     Musculoskeletal negative musculoskeletal ROS (+) Fibromyalgia -  Abdominal Normal abdominal exam  (+)   Peds  Hematology negative hematology ROS (+)   Anesthesia Other Findings   Reproductive/Obstetrics                             Anesthesia Physical  Anesthesia Plan  ASA: II  Anesthesia Plan: General   Post-op Pain Management:    Induction: Intravenous  PONV Risk Score and Plan: 3 and Ondansetron, Dexamethasone and Midazolam  Airway Management Planned: Oral ETT  Additional Equipment:   Intra-op Plan:   Post-operative Plan: Extubation in OR  Informed Consent: I have reviewed the patients History and Physical, chart, labs and discussed the procedure including the risks, benefits and alternatives for the proposed anesthesia with the patient or authorized representative who has indicated his/her understanding and acceptance.   Dental advisory given  Plan Discussed with:   Anesthesia Plan Comments:        Anesthesia Quick Evaluation

## 2017-09-10 NOTE — Transfer of Care (Signed)
Immediate Anesthesia Transfer of Care Note  Patient: [name] [name]  Procedure(s) Performed: OPEN REDUCTION INTERNAL FIXATION (ORIF) LEFT MALLEOLUS ANKLE FRACTURE AND SYNDESMOSIS (Left Ankle)  Patient Location: PACU  Anesthesia Type:GA combined with regional for post-op pain  Level of Consciousness: awake, alert  and oriented  Airway & Oxygen Therapy: Patient Spontanous Breathing and Patient connected to face mask oxygen  Post-op Assessment: Report given to RN and Post -op Vital signs reviewed and stable  Post vital signs: Reviewed and stable  Last Vitals:  Vitals:   09/10/17 0648 09/10/17 1127  BP: (!) 150/94   Pulse: 88 (!) 101  Resp: 18 17  Temp: 36.5 C (!) 36.3 C  SpO2: 100% 100%    Last Pain:  Vitals:   09/10/17 1127  PainSc: (P) 0-No pain      Patients Stated Pain Goal: 3 (16/10/96 0454)  Complications: No apparent anesthesia complications

## 2017-09-11 NOTE — Op Note (Signed)
NAME:  [name], [name]              ACCOUNT NO.:  [id number]  MEDICAL RECORD NO.:  [id number]  LOCATION:                                 FACILITY:  PHYSICIAN:  Astrid Divine. Marcelino Scot, M.D.      DATE OF BIRTH:  DATE OF PROCEDURE:  09/10/2017 DATE OF DISCHARGE:                              OPERATIVE REPORT   PREOPERATIVE DIAGNOSIS: 1. Left medial malleolar fracture. 2. Left ankle syndesmosis disruption.  POSTOPERATIVE DIAGNOSES: 1. Left medial malleolar fracture. 2. Left ankle syndesmosis disruption.  PROCEDURES: 1. Open reduction and internal fixation of left medial malleolus     fracture. 2. Open reduction and internal fixation of left ankle syndesmosis     disruption. 3. Stress fluoroscopy of left ankle syndesmosis.  SURGEON:  Astrid Divine. Marcelino Scot, MD.  ASSISTANT:  Ainsley Spinner, PA-C.  ANESTHESIA:  General, supplemented with regional block.  ESTIMATED BLOOD LOSS:  20 mL.  DRAINS:  None.  SPECIMENS:  None.  TOURNIQUET:  None.  DISPOSITION:  To PACU.  CONDITION:  Stable.  BRIEF SUMMARY OF INDICATION FOR PROCEDURE:  The patient is a 42 year old with cerebral palsy, who sustained a ground-level fall resulting in slight subluxation of the tibiotalar joint laterally with displaced medial malleolus fracture and a ruptured syndesmosis.  This was somewhat subtle.  I did not appreciate a fibular fracture proximally.  I discussed with [name] the risks and benefits of surgical repair including possibility of failure of fixation, particularly in the event of noncompliance, need for further surgery, ankle arthrosis, loss of motion, symptomatic hardware, DVT, PE, and anesthetic complications among others.  A full discussion, she did wish to proceed.  BRIEF SUMMARY OF PROCEDURE:  The patient was taken to the operating room where general anesthesia was induced.  She did receive a preoperative nerve block as well as antibiotic.  The splint was removed from the left leg and it was found to  be completely saturated.  As a consequence, she had quite a bit of macerated skin and this required considerable time to remove it back to healthy soft tissue envelope.  A thorough chlorhexidine scrub and wash was performed after removing the dead skin. This was followed by a standard Betadine scrub and paint and then draping.  Time-out was held.  A medial incision was made first slightly curvilinear, hockey shape extending down to the joint anteriorly and just past the tip of the medial malleolus.  After entering the joint, I removed some fibrous material within and took a 15-blade to identify the medial extent of the fracture and then traced this with a #15 blade toward the joint articular surface.  A Valora Corporal was used to assist with mobilization.  Once this was done, the joint was irrigated.  We did not identify any significant chondral injury on the talus.  The infolded periosteum and callus were mobilized between the fracture surface, but left attached to facilitate healing after compression of the original fractured fragments and fractured plane.  This was quite difficult to do and I ultimately took the hook plate from the [employer] 28 system and engaged the tines just on the lateral or anterior edge of the medial malleolus fragment.  I then  began drilling of a screw through the medial malleolus and was able to mobilize this segment and bring it into appropriate alignment.  I began placement of the distal screw and then the plate which was off the metaphysis medially was brought down to it, correcting the angle and lateral talar translation.  The remainder of the screw tract was then drilled, and the screw placed with good apposition distally.  This was followed by placement of screws in the holes eccentrically proximally and tightening the stem.  This restored position of the medial malleolus as well as the talus.  I did place one additional lock screw distally with 2 standard screws  proximally and again the lag screw through the medial malleolus into the distal tibial metaphysis.  The final images showed appropriate reduction on AP, mortise, and lateral projections.  I then performed an external stress rotation view, which did confirm the gap within the syndesmotic interval.  A 4-cm incision was then made over the lateral malleolus in the area of syndesmotic fixation and 2-hole plate was brought down and fix there and then 2 anchor-type devices drilled into the distal tibia.  One was a TightRope device that was brought away through the medial side, engaged and then the TightRope tightened down and then proximally we used a tricortical device and anchor.  There were no complications.  The patient tolerated the procedure very well.  All wounds were irrigated thoroughly and closed in a standard layered fashion.  Ainsley Spinner, PA-C, did assist me throughout. I did require an assistant for mobilization and reduction of the medial malleolus fragment as well as control of the leg during syndesmotic fixation.  0 Vicryl, 2-0 Vicryl, and 3-0 nylon were used.  Sterile gently compressive dressing and posterior stirrup splint were applied. The patient was taken to PACU in stable condition.  PROGNOSIS:  I am concerned about [name] [name]' ability to maintain compliance given [name] saturated cast on presentation today and we will discuss this with [name] family member, friend today.  She does share custody of [name] [name] and this individual has proposed assistance in the postoperative period over the next few weeks.  Noncompliance would certainly increase [name] risk of loss of reduction, infection, and other problems.  We will on DVT prophylaxis with Lovenox and then switch to aspirin.  I will plan to see [name] back in the office in just 1 week to make sure that the cast is fitting well and there are no complications related to hygiene or cast care.  Weightbearing will begin at 8 weeks. The  cerebral palsy also increases [name] tone and increases the chance of loss of full extension, but currently she is splinted in excellent position.     Astrid Divine. Marcelino Scot, M.D.   ______________________________ Astrid Divine. Marcelino Scot, M.D.    MHH/MEDQ  D:  09/10/2017  T:  09/10/2017  Job:  [id number]

## 2017-09-15 ENCOUNTER — Encounter (HOSPITAL_COMMUNITY): Payer: Self-pay | Admitting: Orthopedic Surgery

## 2017-11-10 ENCOUNTER — Encounter (HOSPITAL_COMMUNITY): Payer: Self-pay

## 2017-11-10 ENCOUNTER — Emergency Department (HOSPITAL_COMMUNITY)
Admission: EM | Admit: 2017-11-10 | Discharge: 2017-11-10 | Disposition: A | Discharge status: Home / Self Care | Payer: Medicaid Other | Attending: Emergency Medicine | Admitting: Emergency Medicine

## 2017-11-10 ENCOUNTER — Emergency Department (HOSPITAL_COMMUNITY): Payer: Medicaid Other

## 2017-11-10 DIAGNOSIS — Y999 Unspecified external cause status: Secondary | ICD-10-CM | POA: Diagnosis not present

## 2017-11-10 DIAGNOSIS — Z7982 Long term (current) use of aspirin: Secondary | ICD-10-CM | POA: Diagnosis not present

## 2017-11-10 DIAGNOSIS — X500XXA Overexertion from strenuous movement or load, initial encounter: Secondary | ICD-10-CM | POA: Diagnosis not present

## 2017-11-10 DIAGNOSIS — G809 Cerebral palsy, unspecified: Secondary | ICD-10-CM | POA: Insufficient documentation

## 2017-11-10 DIAGNOSIS — Z79899 Other long term (current) drug therapy: Secondary | ICD-10-CM | POA: Diagnosis not present

## 2017-11-10 DIAGNOSIS — Y939 Activity, unspecified: Secondary | ICD-10-CM | POA: Diagnosis not present

## 2017-11-10 DIAGNOSIS — W1830XA Fall on same level, unspecified, initial encounter: Secondary | ICD-10-CM | POA: Diagnosis not present

## 2017-11-10 DIAGNOSIS — S93401A Sprain of unspecified ligament of right ankle, initial encounter: Secondary | ICD-10-CM | POA: Insufficient documentation

## 2017-11-10 DIAGNOSIS — Y92009 Unspecified place in unspecified non-institutional (private) residence as the place of occurrence of the external cause: Secondary | ICD-10-CM | POA: Diagnosis not present

## 2017-11-10 DIAGNOSIS — S99911A Unspecified injury of right ankle, initial encounter: Secondary | ICD-10-CM | POA: Diagnosis present

## 2017-11-10 MED ORDER — DICLOFENAC SODIUM 75 MG PO TBEC: 75.0000 mg | DELAYED_RELEASE_TABLET | Freq: Two times a day (BID) | ORAL | 0 refills | Status: DC

## 2017-11-10 NOTE — Discharge Instructions (Signed)
See your Physician work recheck.   Return if any problems.

## 2017-11-10 NOTE — ED Provider Notes (Signed)
[facility] DEPT Provider Note   CSN: [id number] Arrival date & time: 11/10/17  1733     History   Chief Complaint No chief complaint on file.   HPI [name] [name] is a 42 y.o. female.  The history is provided by the patient. No language interpreter was used.  Ankle Pain   The incident occurred 3 to 5 hours ago. The incident occurred at home. The injury mechanism was a fall and torsion. The pain is present in the right ankle and right foot. The pain is moderate. The pain has been constant since onset. Associated symptoms include inability to bear weight. Pertinent negatives include no numbness. She has tried nothing for the symptoms. The treatment provided no relief.    Past Medical History:  Diagnosis Date  . Abnormal Pap smear   . Asthma   . Cerebral palsy ([place])   . Depression    no medication treatment   . Family history of adverse reaction to anesthesia    MOTHER  HARD TIME WAKING UP  . Fibroid   . Fibromyalgia   . Fibromyalgia syndrome   . Headache    HX MIGRAINES  . Hypertension   . 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 Healthcare maintenance 07/13/2017  . Need for immunization against influenza 05/26/2017  . RLQ abdominal pain 04/21/2017  . Pruritus 04/21/2017  . Fibroids 01/14/2017  . Axillary mass 12/03/2016  . Abnormal transaminases 08/22/2015  . Alcohol abuse 08/22/2015  . Fibromyalgia 05/03/2015  . Depression 10/12/2013  . Contraception management 10/12/2013  . Hypertension 06/18/2009  . CEREBRAL PALSY 09/24/2006  . Asthma 09/24/2006  . IRRITABLE BOWEL SYNDROME 09/24/2006    Past Surgical History:  Procedure Laterality Date  . CESAREAN SECTION  03/20/2011   Procedure: CESAREAN SECTION;  Surgeon: Blane Ohara Meisinger;  Location: [facility];  Service: Gynecology;  Laterality: N/A;  Primary Female at 6  . MOUTH SURGERY      42 years old  . MYOMECTOMY  03/20/2011   Procedure: MYOMECTOMY;  Surgeon: Blane Ohara Meisinger;  Location: [facility];  Service: Gynecology;  Laterality: N/A;  . ORIF ANKLE FRACTURE Left 09/10/2017   Procedure: OPEN REDUCTION INTERNAL FIXATION (ORIF) LEFT MALLEOLUS ANKLE FRACTURE AND SYNDESMOSIS;  Surgeon: Altamese Warner Robins, MD;  Location: [facility];  Service: Orthopedics;  Laterality: Left;     OB History    Gravida  1   Para  1   Term  1   Preterm  0   AB  0   Living  1     SAB  0   TAB  0   Ectopic  0   Multiple  0   Live Births  1            Home Medications    Prior to Admission medications   Medication Sig Start Date End Date Taking? Authorizing Provider  albuterol (PROVENTIL HFA;VENTOLIN HFA) 108 (90 Base) MCG/ACT inhaler USE 2 PUFFS INTO THE LUNGS EVERY 6 (SIX) HOURS AS NEEDED FOR WHEEZING. 08/25/17   Nuala Alpha, DO  aspirin EC 325 MG tablet Take 650 mg by mouth 2 (two) times daily as needed for moderate pain.    [provider]  citalopram (CELEXA) 20 MG tablet Take 1 tablet (20 mg total) by mouth daily. 05/25/17   Nuala Alpha, DO  diclofenac (VOLTAREN) 75 MG EC  tablet Take 1 tablet (75 mg total) by mouth 2 (two) times daily. 11/10/17   Fransico Meadow, PA-C  diphenhydrAMINE (BENADRYL) 25 MG tablet Take 50 mg by mouth daily as needed for allergies.    [provider]  diphenhydramine-acetaminophen (TYLENOL PM EXTRA STRENGTH) 25-500 MG TABS tablet Take 2 tablets by mouth at bedtime as needed (sleep/pain).    [provider]  fluticasone (FLOVENT HFA) 110 MCG/ACT inhaler Inhale 2 puffs into the lungs 2 (two) times daily. 07/13/17   Nuala Alpha, DO  gabapentin (NEURONTIN) 100 MG capsule 2 capsules PO twice daily Patient taking differently: Take 200 mg by mouth 2 (two) times daily.  05/25/17   Nuala Alpha, DO  lisinopril-hydrochlorothiazide (ZESTORETIC) 20-12.5 MG tablet Take 1 tablet by mouth daily. 07/13/17   Nuala Alpha,  DO  ondansetron (ZOFRAN ODT) 4 MG disintegrating tablet Take 1 tablet (4 mg total) by mouth every 8 (eight) hours as needed for nausea or vomiting. 09/10/17   Ainsley Spinner, PA-C  oxyCODONE (OXY IR/ROXICODONE) 5 MG immediate release tablet Take 1 tablet (5 mg total) by mouth every 6 (six) hours as needed for breakthrough pain (take between percocet for breakthrough pain only). 09/10/17   Ainsley Spinner, PA-C  oxyCODONE-acetaminophen (PERCOCET/ROXICET) 5-325 MG tablet Take 1-2 tablets by mouth every 8 (eight) hours as needed. 09/10/17   Ainsley Spinner, PA-C    Family History Family History  Problem Relation Age of Onset  . Birth defects Cousin        Down Syndrome  . Anesthesia problems Mother     Social History Social History   Tobacco Use  . Smoking status: Never Smoker  . Smokeless tobacco: Never Used  Substance Use Topics  . Alcohol use: Yes    Alcohol/week: 0.6 oz    Types: 1 Glasses of wine per week    Comment: 1 glass wine per day until known pregnancy at approx 12 weeks  . Drug use: No     Allergies   Pineapple; Tomato; and Hydrocodone   Review of Systems Review of Systems  Neurological: Negative for numbness.  All other systems reviewed and are negative.    Physical Exam Updated Vital Signs BP 119/86 (BP Location: Left Arm)   Pulse 87   Temp 98.3 F (36.8 C) (Oral)   Resp 18   Ht 4\' 9"  (1.448 m)   Wt 83.5 kg (184 lb)   LMP 11/02/2017   SpO2 98%   BMI 39.82 kg/m   Physical Exam  Constitutional: She is oriented to person, place, and time. She appears well-developed and well-nourished.  HENT:  Head: Normocephalic.  Eyes: EOM are normal.  Neck: Normal range of motion.  Cardiovascular: Normal rate.  Pulmonary/Chest: Effort normal.  Abdominal: She exhibits no distension.  Musculoskeletal: Normal range of motion.  Pain lateral malleolus,  Decreased range of motion  nv and ns intact  Neurological: She is alert and oriented to person, place, and time.    Psychiatric: She has a normal mood and affect.  Nursing note and vitals reviewed.    ED Treatments / Results  Labs (all labs ordered are listed, but only abnormal results are displayed) Labs Reviewed - No data to display  EKG None  Radiology Dg Ankle Complete Right  Result Date: 11/10/2017 CLINICAL DATA:  Pain following twisting injury EXAM: RIGHT ANKLE - COMPLETE 3+ VIEW COMPARISON:  January 08, 2013 FINDINGS: Frontal, oblique, and lateral views were obtained. No fracture or joint effusion. No joint space narrowing or  erosion. There is spurring in the dorsal midfoot. Ankle mortise appears intact. IMPRESSION: No evident fracture. No appreciable arthropathy in the ankle region. There is spurring in the dorsal midfoot. Ankle mortise appears intact. Electronically Signed   By: Lowella Grip III M.D.   On: 11/10/2017 19:55    Procedures Procedures (including critical care time)  Medications Ordered in ED Medications - No data to display   Initial Impression / Assessment and Plan / ED Course  I have reviewed the triage vital signs and the nursing notes.  Pertinent labs & imaging results that were available during my care of the patient were reviewed by me and considered in my medical decision making (see chart for details).     MDM  No fracture.  Pt wears a boot on other foot.  Pt is advised to follow up with her Orthopaedist Dr. Marcelino Scot for recheck  Pt placed in ace wrap and shoe  Final Clinical Impressions(s) / ED Diagnoses   Final diagnoses:  Sprain of right ankle, unspecified ligament, initial encounter    ED Discharge Orders        Ordered    diclofenac (VOLTAREN) 75 MG EC tablet  2 times daily     11/10/17 2006    An After Visit Summary was printed and given to the patient.    Fransico Meadow, Hershal Coria 11/10/17 2233    Gareth Morgan, MD 11/11/17 1407

## 2017-11-10 NOTE — ED Triage Notes (Signed)
Per EMS, patient c/o right ankle pain after sliding out of bed today. Reports walking with cane. Hx cerebral palsy. No deformity noted.

## 2017-11-10 NOTE — ED Notes (Signed)
Bed: WTR6 Expected date:  Expected time:  Means of arrival:  Comments: EMS-ankle pain

## 2018-01-02 ENCOUNTER — Other Ambulatory Visit: Payer: Self-pay | Admitting: Family Medicine

## 2018-02-02 ENCOUNTER — Other Ambulatory Visit: Payer: Self-pay | Admitting: Family Medicine

## 2018-02-02 ENCOUNTER — Telehealth: Payer: Self-pay | Admitting: *Deleted

## 2018-02-02 ENCOUNTER — Ambulatory Visit: Payer: Medicaid Other | Admitting: Family Medicine

## 2018-02-02 NOTE — Telephone Encounter (Signed)
Pt missed her appt this am.  She is still having some SOB ( able to speak with little difficulty) but has not picked up her albuterol from the pharmacy yet.  She will go pick this up and give [place] a call back if needed. Fleeger, Salome Spotted, CMA

## 2018-04-08 ENCOUNTER — Emergency Department (HOSPITAL_COMMUNITY): Payer: Medicaid Other

## 2018-04-08 ENCOUNTER — Emergency Department (HOSPITAL_COMMUNITY)
Admission: EM | Admit: 2018-04-08 | Discharge: 2018-04-09 | Disposition: A | Discharge status: Home / Self Care | Payer: Medicaid Other | Attending: Emergency Medicine | Admitting: Emergency Medicine

## 2018-04-08 ENCOUNTER — Other Ambulatory Visit: Payer: Self-pay

## 2018-04-08 ENCOUNTER — Encounter (HOSPITAL_COMMUNITY): Payer: Self-pay | Admitting: *Deleted

## 2018-04-08 DIAGNOSIS — I10 Essential (primary) hypertension: Secondary | ICD-10-CM | POA: Insufficient documentation

## 2018-04-08 DIAGNOSIS — J4521 Mild intermittent asthma with (acute) exacerbation: Secondary | ICD-10-CM | POA: Diagnosis not present

## 2018-04-08 DIAGNOSIS — Z79899 Other long term (current) drug therapy: Secondary | ICD-10-CM | POA: Insufficient documentation

## 2018-04-08 DIAGNOSIS — G809 Cerebral palsy, unspecified: Secondary | ICD-10-CM | POA: Insufficient documentation

## 2018-04-08 DIAGNOSIS — R0602 Shortness of breath: Secondary | ICD-10-CM | POA: Diagnosis present

## 2018-04-08 MED ORDER — PREDNISONE 20 MG PO TABS
60.0000 mg | ORAL_TABLET | Freq: Once | ORAL | Status: AC
  Administered 2018-04-08: 60 mg via ORAL
  Filled 2018-04-08: qty 3

## 2018-04-08 MED ORDER — ALBUTEROL SULFATE (2.5 MG/3ML) 0.083% IN NEBU
5.0000 mg | INHALATION_SOLUTION | Freq: Once | RESPIRATORY_TRACT | Status: AC
  Administered 2018-04-08: 5 mg via RESPIRATORY_TRACT
  Filled 2018-04-08: qty 6

## 2018-04-08 MED ORDER — IPRATROPIUM-ALBUTEROL 0.5-2.5 (3) MG/3ML IN SOLN
3.0000 mL | Freq: Once | RESPIRATORY_TRACT | Status: AC
  Administered 2018-04-08: 3 mL via RESPIRATORY_TRACT
  Filled 2018-04-08: qty 3

## 2018-04-08 NOTE — ED Triage Notes (Signed)
Pt reports diff breathing this afternoon.  She has asthma. Have used her inhaler without relief.  She reports her daughter had a cold yesterday and woke up today with one.  She is not able to complete a sentence and her breathing is labored.  She is using her accessory muscle to breath.  Productive cough noted.  Audible wheezing noted.

## 2018-04-09 ENCOUNTER — Encounter: Payer: Self-pay | Admitting: Family Medicine

## 2018-04-09 ENCOUNTER — Ambulatory Visit (INDEPENDENT_AMBULATORY_CARE_PROVIDER_SITE_OTHER): Payer: Medicaid Other | Admitting: Family Medicine

## 2018-04-09 VITALS — BP 158/100 | HR 125 | Temp 98.7°F | Ht <= 58 in

## 2018-04-09 DIAGNOSIS — J4541 Moderate persistent asthma with (acute) exacerbation: Secondary | ICD-10-CM | POA: Diagnosis not present

## 2018-04-09 LAB — POCT INFLUENZA A/B
INFLUENZA A, POC: NEGATIVE
INFLUENZA B, POC: NEGATIVE

## 2018-04-09 MED ORDER — BUDESONIDE-FORMOTEROL FUMARATE 80-4.5 MCG/ACT IN AERO: INHALATION_SPRAY | RESPIRATORY_TRACT | 3 refills | Status: DC

## 2018-04-09 MED ORDER — IPRATROPIUM-ALBUTEROL 0.5-2.5 (3) MG/3ML IN SOLN: 3.0000 mL | Freq: Once | RESPIRATORY_TRACT | Status: DC

## 2018-04-09 MED ORDER — PREDNISONE 20 MG PO TABS
40.0000 mg | ORAL_TABLET | Freq: Once | ORAL | Status: AC
  Administered 2018-04-09: 40 mg via ORAL

## 2018-04-09 MED ORDER — ALBUTEROL SULFATE (2.5 MG/3ML) 0.083% IN NEBU
2.5000 mg | INHALATION_SOLUTION | Freq: Once | RESPIRATORY_TRACT | Status: AC
  Administered 2018-04-09: 2.5 mg via RESPIRATORY_TRACT

## 2018-04-09 MED ORDER — ALBUTEROL SULFATE HFA 108 (90 BASE) MCG/ACT IN AERS
2.0000 | INHALATION_SPRAY | Freq: Four times a day (QID) | RESPIRATORY_TRACT | Status: DC | PRN
  Administered 2018-04-09: 2 via RESPIRATORY_TRACT
  Filled 2018-04-09: qty 6.7

## 2018-04-09 MED ORDER — PREDNISONE 20 MG PO TABS: 40.0000 mg | ORAL_TABLET | Freq: Every day | ORAL | 0 refills | Status: AC

## 2018-04-09 MED ORDER — IPRATROPIUM BROMIDE 0.02 % IN SOLN
0.5000 mg | Freq: Once | RESPIRATORY_TRACT | Status: AC
  Administered 2018-04-09: 0.5 mg via RESPIRATORY_TRACT

## 2018-04-09 MED ORDER — PREDNISONE 20 MG PO TABS: ORAL_TABLET | ORAL | 0 refills | Status: DC

## 2018-04-09 NOTE — Addendum Note (Signed)
Addended by: [place] Pink on: 04/09/2018 12:05 PM   Modules accepted: Orders

## 2018-04-09 NOTE — ED Provider Notes (Signed)
[facility] DEPT Provider Note   CSN: [id number] Arrival date & time: 04/08/18  2150     History   Chief Complaint Chief Complaint  Patient presents with  . Respiratory Distress    HPI [name] [name] is a 42 y.o. female.  Patient with history of asthma. She has run out of her rescue inhaler. She is awaiting an appointment with her PCP for refills of medications. Her child has been recently sick with upper respiratory symptoms.  The history is provided by the patient. No language interpreter was used.  Shortness of Breath  This is a new problem. The current episode started yesterday. The problem has been gradually worsening. Associated symptoms include wheezing. Pertinent negatives include no chest pain. It is unknown what precipitated the problem. She has tried nothing for the symptoms. She has had no prior hospitalizations. She has had no prior ICU admissions. Associated medical issues include asthma.    Past Medical History:  Diagnosis Date  . Abnormal Pap smear   . Asthma   . Cerebral palsy ([place])   . Depression    no medication treatment   . Family history of adverse reaction to anesthesia    MOTHER  HARD TIME WAKING UP  . Fibroid   . Fibromyalgia   . Fibromyalgia syndrome   . Headache    HX MIGRAINES  . Hypertension   . 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 Pruritus 04/21/2017  . Fibroids 01/14/2017  . Axillary mass 12/03/2016  . Abnormal transaminases 08/22/2015  . Alcohol abuse 08/22/2015  . Fibromyalgia 05/03/2015  . Depression 10/12/2013  . Contraception management 10/12/2013  . Hypertension 06/18/2009  . CEREBRAL PALSY 09/24/2006  . Asthma 09/24/2006  . IRRITABLE BOWEL SYNDROME 09/24/2006    Past Surgical History:  Procedure Laterality Date  . CESAREAN SECTION  03/20/2011   Procedure: CESAREAN SECTION;  Surgeon:  Blane Ohara Meisinger;  Location: [facility];  Service: Gynecology;  Laterality: N/A;  Primary Female at 63  . MOUTH SURGERY     42 years old  . MYOMECTOMY  03/20/2011   Procedure: MYOMECTOMY;  Surgeon: Blane Ohara Meisinger;  Location: [facility];  Service: Gynecology;  Laterality: N/A;  . ORIF ANKLE FRACTURE Left 09/10/2017   Procedure: OPEN REDUCTION INTERNAL FIXATION (ORIF) LEFT MALLEOLUS ANKLE FRACTURE AND SYNDESMOSIS;  Surgeon: Altamese Victoria, MD;  Location: [facility];  Service: Orthopedics;  Laterality: Left;     OB History    Gravida  1   Para  1   Term  1   Preterm  0   AB  0   Living  1     SAB  0   TAB  0   Ectopic  0   Multiple  0   Live Births  1            Home Medications    Prior to Admission medications   Medication Sig Start Date End Date Taking? Authorizing Provider  albuterol (PROVENTIL HFA;VENTOLIN HFA) 108 (90 Base) MCG/ACT inhaler USE 2 PUFFS INTO THE LUNGS EVERY 6 (SIX) HOURS AS NEEDED FOR WHEEZING. Patient taking differently: Inhale 2 puffs into the lungs every 6 (six) hours as needed for wheezing.  08/25/17  Yes Lockamy, Timothy, DO  diphenhydramine-acetaminophen (TYLENOL PM EXTRA STRENGTH) 25-500 MG TABS tablet Take 2 tablets by mouth at bedtime as needed (sleep/pain).  Yes [provider]  fluticasone (FLOVENT HFA) 110 MCG/ACT inhaler Inhale 2 puffs into the lungs 2 (two) times daily. 07/13/17  Yes Lockamy, Christia Reading, DO  citalopram (CELEXA) 20 MG tablet TAKE 1 TABLET BY MOUTH EVERY DAY 01/04/18   Nuala Alpha, DO  diclofenac (VOLTAREN) 75 MG EC tablet Take 1 tablet (75 mg total) by mouth 2 (two) times daily. Patient not taking: Reported on 04/08/2018 11/10/17   Fransico Meadow, PA-C  gabapentin (NEURONTIN) 100 MG capsule Take 2 capsules (200 mg total) by mouth 2 (two) times daily. 02/02/18   Nuala Alpha, DO  lisinopril-hydrochlorothiazide (ZESTORETIC) 20-12.5 MG tablet Take 1 tablet by mouth daily. 07/13/17   Lockamy, Christia Reading, DO  ondansetron  (ZOFRAN ODT) 4 MG disintegrating tablet Take 1 tablet (4 mg total) by mouth every 8 (eight) hours as needed for nausea or vomiting. Patient not taking: Reported on 04/08/2018 09/10/17   Ainsley Spinner, PA-C  oxyCODONE (OXY IR/ROXICODONE) 5 MG immediate release tablet Take 1 tablet (5 mg total) by mouth every 6 (six) hours as needed for breakthrough pain (take between percocet for breakthrough pain only). Patient not taking: Reported on 04/08/2018 09/10/17   Ainsley Spinner, PA-C  oxyCODONE-acetaminophen (PERCOCET/ROXICET) 5-325 MG tablet Take 1-2 tablets by mouth every 8 (eight) hours as needed. Patient not taking: Reported on 04/08/2018 09/10/17   Ainsley Spinner, PA-C    Family History Family History  Problem Relation Age of Onset  . Birth defects Cousin        Down Syndrome  . Anesthesia problems Mother     Social History Social History   Tobacco Use  . Smoking status: Never Smoker  . Smokeless tobacco: Never Used  Substance Use Topics  . Alcohol use: Yes    Alcohol/week: 1.0 standard drinks    Types: 1 Glasses of wine per week    Comment: 1 glass wine per day until known pregnancy at approx 12 weeks  . Drug use: No     Allergies   Pineapple; Tomato; and Hydrocodone   Review of Systems Review of Systems  Respiratory: Positive for shortness of breath and wheezing.   Cardiovascular: Negative for chest pain.  All other systems reviewed and are negative.    Physical Exam Updated Vital Signs BP (!) 162/107 (BP Location: Right Arm)   Pulse (!) 114   Temp 98.4 F (36.9 C) (Oral)   Resp (!) 22   LMP 03/09/2018   SpO2 98%   Physical Exam  Constitutional: She is oriented to person, place, and time. She appears well-developed and well-nourished.  Eyes: Conjunctivae are normal.  Neck: Neck supple.  Cardiovascular: Normal rate and regular rhythm.  Pulmonary/Chest: Effort normal. She has wheezes. She exhibits no tenderness.  Abdominal: Soft. Bowel sounds are normal.  Musculoskeletal:  Normal range of motion. She exhibits no edema.  Neurological: She is alert and oriented to person, place, and time.  Skin: Skin is warm and dry.  Psychiatric: She has a normal mood and affect.  Nursing note and vitals reviewed.    ED Treatments / Results  Labs (all labs ordered are listed, but only abnormal results are displayed) Labs Reviewed - No data to display  EKG None  Radiology Dg Chest 2 View  Result Date: 04/08/2018 CLINICAL DATA:  Coughing, wheezing, history asthma, hypertension EXAM: CHEST - 2 VIEW COMPARISON:  02/07/2014 FINDINGS: Normal heart size, mediastinal contours, and pulmonary vascularity. Minimal chronic peribronchial thickening. Lungs otherwise clear. No pleural effusion or pneumothorax. Minimal endplate spur formation thoracic  spine IMPRESSION: Minimal chronic bronchitic changes which may related to history of asthma. No acute infiltrate. Electronically Signed   By: Lavonia Dana M.D.   On: 04/08/2018 23:49    Procedures Procedures (including critical care time)  Medications Ordered in ED Medications  albuterol (PROVENTIL) (2.5 MG/3ML) 0.083% nebulizer solution 5 mg (5 mg Nebulization Given 04/08/18 2210)  ipratropium-albuterol (DUONEB) 0.5-2.5 (3) MG/3ML nebulizer solution 3 mL (3 mLs Nebulization Given 04/08/18 2346)  predniSONE (DELTASONE) tablet 60 mg (60 mg Oral Given 04/08/18 2346)     Initial Impression / Assessment and Plan / ED Course  I have reviewed the triage vital signs and the nursing notes.  Pertinent labs & imaging results that were available during my care of the patient were reviewed by me and considered in my medical decision making (see chart for details).     Patient ambulated in ED with O2 saturations maintained >90, no current signs of respiratory distress. Lung exam improved after nebulizer treatment. Prednisone given in the ED and pt will bd dc with 4 day burst. Pt states they are breathing at baseline. Pt has been instructed to  continue using prescribed medications and to speak with PCP about today's exacerbation.   Final Clinical Impressions(s) / ED Diagnoses   Final diagnoses:  Mild intermittent asthma with exacerbation    ED Discharge Orders         Ordered    predniSONE (DELTASONE) 20 MG tablet     04/09/18 0013           Etta Quill, NP 04/09/18 0016    Molpus, Jenny Reichmann, MD 04/09/18 [phone]

## 2018-04-09 NOTE — Assessment & Plan Note (Addendum)
[name] [name] is a 42 y.o. female presenting for asthma exacerbation.  Patient compliant with controller or regularly use of rescue.  Given 1 DuoNeb's, with minimal improvement.  Repeat duo nebs, patient had significant improvement in symptoms.  Able to talk without her breathing and significant improvement in wheezing.  Patient given 40 mg oral prednisone in the office.  Sent home with continued 4-day course 40 mg of prednisone and Symbicort controller medication and rescue inhaler per  GINA 2019 guidelines.  Advised to use Symbicort over albuterol inhaler for rescue.  Patient return to clinic in 3 to 4 days follow-up with PCP.

## 2018-04-09 NOTE — Patient Instructions (Signed)
Asthma, Acute Bronchospasm °Acute bronchospasm caused by asthma is also referred to as an asthma attack. Bronchospasm means your air passages become narrowed. The narrowing is caused by inflammation and tightening of the muscles in the air tubes (bronchi) in your lungs. This can make it hard to breathe or cause you to wheeze and cough. °What are the causes? °Possible triggers are: °· Animal dander from the skin, hair, or feathers of animals. °· Dust mites contained in house dust. °· Cockroaches. °· Pollen from trees or grass. °· Mold. °· Cigarette or tobacco smoke. °· Air pollutants such as dust, household cleaners, hair sprays, aerosol sprays, paint fumes, strong chemicals, or strong odors. °· Cold air or weather changes. Cold air may trigger inflammation. Winds increase molds and pollens in the air. °· Strong emotions such as crying or laughing hard. °· Stress. °· Certain medicines such as aspirin or beta-blockers. °· Sulfites in foods and drinks, such as dried fruits and wine. °· Infections or inflammatory conditions, such as a flu, cold, or inflammation of the nasal membranes (rhinitis). °· Gastroesophageal reflux disease (GERD). GERD is a condition where stomach acid backs up into your esophagus. °· Exercise or strenuous activity. ° °What are the signs or symptoms? °· Wheezing. °· Excessive coughing, particularly at night. °· Chest tightness. °· Shortness of breath. °How is this diagnosed? °Your health care provider will ask you about your medical history and perform a physical exam. A chest X-ray or blood testing may be performed to look for other causes of your symptoms or other conditions that may have triggered your asthma attack. °How is this treated? °Treatment is aimed at reducing inflammation and opening up the airways in your lungs. Most asthma attacks are treated with inhaled medicines. These include quick relief or rescue medicines (such as bronchodilators) and controller medicines (such as inhaled  corticosteroids). These medicines are sometimes given through an inhaler or a nebulizer. Systemic steroid medicine taken by mouth or given through an IV tube also can be used to reduce the inflammation when an attack is moderate or severe. Antibiotic medicines are only used if a bacterial infection is present. °Follow these instructions at home: °· Rest. °· Drink plenty of liquids. This helps the mucus to remain thin and be easily coughed up. Only use caffeine in moderation and do not use alcohol until you have recovered from your illness. °· Do not smoke. Avoid being exposed to secondhand smoke. °· You play a critical role in keeping yourself in good health. Avoid exposure to things that cause you to wheeze or to have breathing problems. °· Keep your medicines up-to-date and available. Carefully follow your health care provider’s treatment plan. °· Take your medicine exactly as prescribed. °· When pollen or pollution is bad, keep windows closed and use an air conditioner or go to places with air conditioning. °· Asthma requires careful medical care. See your health care provider for a follow-up as advised. If you are more than [redacted] weeks pregnant and you were prescribed any new medicines, let your obstetrician know about the visit and how you are doing. Follow up with your health care provider as directed. °· After you have recovered from your asthma attack, make an appointment with your outpatient doctor to talk about ways to reduce the likelihood of future attacks. If you do not have a doctor who manages your asthma, make an appointment with a primary care doctor to discuss your asthma. °Get help right away if: °· You are getting worse. °·   You have trouble breathing. If severe, call your local emergency services (911 in the [place] °· You develop chest pain or discomfort. °· You are vomiting. °· You are not able to keep fluids down. °· You are coughing up yellow, green, brown, or bloody sputum. °· You have a fever  and your symptoms suddenly get worse. °· You have trouble swallowing. °This information is not intended to replace advice given to you by your health care provider. Make sure you discuss any questions you have with your health care provider. °Document Released: 10/29/2006 Document Revised: 12/26/2015 Document Reviewed: 01/19/2013 °Elsevier Interactive Patient Education © 2017 [employer]vier Inc. ° °

## 2018-04-09 NOTE — Progress Notes (Signed)
Subjective:     [name] [name] is an 42 y.o. female who presents for follow up of asthma. The patient is currently having symptoms / an exacerbation. Current symptoms include dyspnea, productive cough and wheezing. Symptoms have been present since 1 day ago and have been gradually worsening. She denies chest pain and chest tightness. Associated symptoms include chills, fatigue, shortness of breath, sweats, weakness and wheezing.  This episode appears to have been triggered by infection. Treatments tried for the current exacerbation include short-acting inhaled beta-adrenergic agonists and systemic corticosteroids, which have provided no relief of symptoms. The patient has been having similar episodes for approximately 1 day. Was seen in the ED last night for the symptoms.  Patient was giving albuterol treatment and 20 mg prednisone.  Prescribed more prednisone and rescue inhaler to go home with.  Patient woke up this a.m. with continued her breathing so she came into the doctor to be evaluated.  The following portions of the patient's history were reviewed and updated as appropriate: allergies, current medications, past family history, past medical history, past social history, past surgical history and problem list.  Review of Systems Pertinent items are noted in HPI.    ROS: Per HPI  PMH: Asthma, Chronic Pain.     Objective:   Vitals:   04/09/18 0915  BP: (!) 158/100  Pulse: (!) 125  Temp: 98.7 F (37.1 C)  TempSrc: Oral  SpO2: 96%  Height: 4\' 9"  (1.448 m)  RR: 24  Gen: gasping between words to breath, tired CV: tachycardic,  Pulm: breathing deep, wheezes throughout, cough   Orders Placed This Encounter  Procedures  . Influenza A/B   Meds ordered this encounter  Medications  . DISCONTD: ipratropium-albuterol (DUONEB) 0.5-2.5 (3) MG/3ML nebulizer solution 3 mL  . albuterol (PROVENTIL) (2.5 MG/3ML) 0.083% nebulizer solution 2.5 mg  . ipratropium (ATROVENT) nebulizer solution 0.5  mg  . DISCONTD: ipratropium-albuterol (DUONEB) 0.5-2.5 (3) MG/3ML nebulizer solution 3 mL  . albuterol (PROVENTIL) (2.5 MG/3ML) 0.083% nebulizer solution 2.5 mg  . ipratropium (ATROVENT) nebulizer solution 0.5 mg  . predniSONE (DELTASONE) tablet 40 mg  . predniSONE (DELTASONE) 20 MG tablet    Sig: Take 2 tablets (40 mg total) by mouth daily with breakfast for 4 days.    Dispense:  8 tablet    Refill:  0  . budesonide-formoterol (SYMBICORT) 80-4.5 MCG/ACT inhaler    Sig: Inhale 2 puffs into the lungs 2 (two) times daily. May also inhale 2 puffs as needed (shortness of breath or wheezing).    Dispense:  1 Inhaler    Refill:  3    Assessment and Plan:  Asthma [name] [name] is a 42 y.o. female presenting for asthma exacerbation.  Patient compliant with controller or regularly use of rescue.  Given 1 DuoNeb's, with minimal improvement.  Repeat duo nebs, patient had significant improvement in symptoms.  Able to talk without her breathing and significant improvement in wheezing.  Patient given 40 mg oral prednisone in the office.  Sent home with continued 4-day course 40 mg of prednisone and Symbicort controller medication and rescue inhaler per  GINA 2019 guidelines.  Advised to use Symbicort over albuterol inhaler for rescue.  Patient return to clinic in 3 to 4 days follow-up with PCP.

## 2018-04-13 ENCOUNTER — Ambulatory Visit (INDEPENDENT_AMBULATORY_CARE_PROVIDER_SITE_OTHER): Payer: Medicaid Other | Admitting: Family Medicine

## 2018-04-13 DIAGNOSIS — J4541 Moderate persistent asthma with (acute) exacerbation: Secondary | ICD-10-CM

## 2018-04-13 MED ORDER — GABAPENTIN 100 MG PO CAPS: 200.0000 mg | ORAL_CAPSULE | Freq: Two times a day (BID) | ORAL | 3 refills | Status: DC

## 2018-04-13 MED ORDER — CITALOPRAM HYDROBROMIDE 20 MG PO TABS: 20.0000 mg | ORAL_TABLET | Freq: Every day | ORAL | 3 refills | Status: DC

## 2018-04-13 MED ORDER — LISINOPRIL-HYDROCHLOROTHIAZIDE 20-12.5 MG PO TABS: 1.0000 | ORAL_TABLET | Freq: Every day | ORAL | 3 refills | Status: DC

## 2018-04-13 MED ORDER — ONDANSETRON 4 MG PO TBDP: 4.0000 mg | ORAL_TABLET | Freq: Three times a day (TID) | ORAL | 0 refills | Status: DC | PRN

## 2018-04-13 NOTE — Patient Instructions (Signed)
It was great seeing you today! We have addressed the following issues today  1. I refilled your medications. 2. Continue your Symbicort as prescribed 3. Follow up as needed.   If we did any lab work today, and the results require attention, either me or my nurse will get in touch with you. If everything is normal, you will get a letter in mail and a message via . If you don't hear from [place] in two weeks, please give [place] a call. Otherwise, we look forward to seeing you again at your next visit. If you have any questions or concerns before then, please call the clinic at [phone].  Please bring all your medications to every doctors visit  Sign up for My Chart to have easy access to your labs results, and communication with your Primary care physician. Please ask Front Desk for some assistance.   Please check-out at the front desk before leaving the clinic.    Take Care,   Dr. Andy Gauss

## 2018-04-13 NOTE — Assessment & Plan Note (Signed)
Patient is much improved since last clinic visit, she has been using symbicort daily and just completed her prednisone course. Exam was unremarkable without any wheezing or increased WOB though she reports still feeling a bit congested. Patient will continue current regimen and will follow up with PCP as needed.

## 2018-04-13 NOTE — Progress Notes (Signed)
   Subjective:    Patient ID: [name] [name], female    DOB: [birth year], 42 y.o.   MRN: [id number]   CC: Follow up for recent asthma exacerbation  HPI: Patient is a 42 yo female who presents today to follow up on recent asthma exacerbation. Patient was seen in clinic on 9/13 and received duonebs treatment in the office and was started on prednisone 40 mg daily for 5 days. Patient took her last dose today. She reports breathing is much improved from last week denies any shortness of breath, wheezing or increase work of breathing. She was also started on symbicort for controller and her flovent was discontinued. She has been using daily as prescribed.  Smoking status reviewed   ROS: all other systems were reviewed and are negative other than in the HPI   Past Medical History:  Diagnosis Date  . Abnormal Pap smear   . Asthma   . Cerebral palsy ([place])   . Depression    no medication treatment   . Family history of adverse reaction to anesthesia    MOTHER  HARD TIME WAKING UP  . Fibroid   . Fibromyalgia   . Fibromyalgia syndrome   . Headache    HX MIGRAINES  . Hypertension   . 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 CESAREAN SECTION  03/20/2011   Procedure: CESAREAN SECTION;  Surgeon: Blane Ohara Meisinger;  Location: [facility];  Service: Gynecology;  Laterality: N/A;  Primary Female at 76  . MOUTH SURGERY     43 years old  . MYOMECTOMY  03/20/2011   Procedure: MYOMECTOMY;  Surgeon: Blane Ohara Meisinger;  Location: [facility];  Service: Gynecology;  Laterality: N/A;  . ORIF ANKLE FRACTURE Left 09/10/2017   Procedure: OPEN REDUCTION INTERNAL FIXATION (ORIF) LEFT MALLEOLUS ANKLE FRACTURE AND SYNDESMOSIS;  Surgeon: Altamese Drake, MD;  Location: [facility];  Service: Orthopedics;  Laterality: Left;    Past medical history, surgical, family, and social history reviewed and updated in the EMR as  appropriate.  Objective:  BP 140/90   Pulse 96   Temp 98.6 F (37 C) (Oral)   Wt 189 lb 6.4 oz (85.9 kg)   LMP 04/13/2018   SpO2 98%   BMI 40.99 kg/m   Vitals and nursing note reviewed  General: NAD, pleasant, able to participate in exam Cardiac: RRR, normal heart sounds, no murmurs. 2+ radial and PT pulses bilaterally Respiratory: CTAB, normal effort, No wheezes, rales or rhonchi Abdomen: soft, nontender, nondistended, no hepatic or splenomegaly, +BS Extremities: no edema or cyanosis. WWP. Skin: warm and dry, no rashes noted Neuro: alert and oriented x4, no focal deficits Psych: Normal affect and mood   Assessment & Plan:   Asthma Patient is much improved since last clinic visit, she has been using symbicort daily and just completed her prednisone course. Exam was unremarkable without any wheezing or increased WOB though she reports still feeling a bit congested. Patient will continue current regimen and will follow up with PCP as needed.     Marjie Skiff, MD [facility] PGY-3

## 2018-04-21 ENCOUNTER — Encounter: Payer: Medicaid Other | Admitting: Family Medicine

## 2018-05-27 ENCOUNTER — Ambulatory Visit: Payer: Medicaid Other | Admitting: Obstetrics and Gynecology

## 2018-05-27 ENCOUNTER — Encounter: Payer: Self-pay | Admitting: Obstetrics and Gynecology

## 2018-05-27 ENCOUNTER — Other Ambulatory Visit (HOSPITAL_COMMUNITY)
Admission: RE | Admit: 2018-05-27 | Discharge: 2018-05-27 | Disposition: A | Discharge status: Home / Self Care | Payer: Medicaid Other | Source: Ambulatory Visit | Attending: Obstetrics and Gynecology | Admitting: Obstetrics and Gynecology

## 2018-05-27 ENCOUNTER — Other Ambulatory Visit: Payer: Self-pay

## 2018-05-27 VITALS — BP 152/94 | HR 102 | Ht <= 58 in | Wt 193.7 lb

## 2018-05-27 DIAGNOSIS — Z113 Encounter for screening for infections with a predominantly sexual mode of transmission: Secondary | ICD-10-CM | POA: Diagnosis not present

## 2018-05-27 DIAGNOSIS — Z124 Encounter for screening for malignant neoplasm of cervix: Secondary | ICD-10-CM | POA: Diagnosis present

## 2018-05-27 DIAGNOSIS — Z3042 Encounter for surveillance of injectable contraceptive: Secondary | ICD-10-CM | POA: Diagnosis not present

## 2018-05-27 DIAGNOSIS — D259 Leiomyoma of uterus, unspecified: Secondary | ICD-10-CM

## 2018-05-27 DIAGNOSIS — Z23 Encounter for immunization: Secondary | ICD-10-CM

## 2018-05-27 DIAGNOSIS — Z30013 Encounter for initial prescription of injectable contraceptive: Secondary | ICD-10-CM

## 2018-05-27 DIAGNOSIS — Z01419 Encounter for gynecological examination (general) (routine) without abnormal findings: Secondary | ICD-10-CM

## 2018-05-27 DIAGNOSIS — Z0000 Encounter for general adult medical examination without abnormal findings: Secondary | ICD-10-CM

## 2018-05-27 DIAGNOSIS — Z3202 Encounter for pregnancy test, result negative: Secondary | ICD-10-CM | POA: Diagnosis not present

## 2018-05-27 LAB — POCT URINE PREGNANCY: Preg Test, Ur: NEGATIVE

## 2018-05-27 MED ORDER — MEDROXYPROGESTERONE ACETATE 150 MG/ML IM SUSP: 150.0000 mg | INTRAMUSCULAR | 0 refills | Status: DC

## 2018-05-27 MED ORDER — MEDROXYPROGESTERONE ACETATE 150 MG/ML IM SUSP
150.0000 mg | Freq: Once | INTRAMUSCULAR | Status: AC
  Administered 2018-05-27: 150 mg via INTRAMUSCULAR

## 2018-05-27 NOTE — Progress Notes (Signed)
New GYN presents for AEX/PAP, last PAP was 2 years ago. C/o pain and pressure due to Fibroids 4/10 x 4 months.  She want to start DEPO.  UPT today is Negative.

## 2018-05-27 NOTE — Progress Notes (Signed)
GYNECOLOGY ANNUAL PREVENTATIVE CARE ENCOUNTER NOTE  Subjective:   [name] [name] is a 42 y.o. G14P1001 female here for a annual gynecologic exam. Current complaints: pelvic pressure, occasional pelvic pain. Having regular monthly periods, 5-7 days. First 4 days are heavy. Also wants to get on depo for contraception, not currently active and not on any current meds for contraception.   Wants to check up on her fibroids. Was told she had fibroids at the time of her CS 02/2011, at which point she had one large fibroid (5 lbs), which was removed concurrently with CS.      Gynecologic History Patient's last menstrual period was 05/15/2018. Contraception: abstinence Last Pap: 10/2014. Results were: negative Last mammogram: n/a  Obstetric History OB History  Gravida Para Term Preterm AB Living  1 1 1  0 0 1  SAB TAB Ectopic Multiple Live Births  0 0 0 0 1    # Outcome Date GA Lbr Len/2nd Weight Sex Delivery Anes PTL Lv  1 Term 03/20/11 [redacted]w[redacted]d  7 lb 2.8 oz (3.255 kg) F CS-LTranv EPI  LIV     Birth Comments: No dysmorphic features at delivery    Past Medical History:  Diagnosis Date  . Abnormal Pap smear   . Asthma   . Cerebral palsy ([place])   . Depression    no medication treatment   . Family history of adverse reaction to anesthesia    MOTHER  HARD TIME WAKING UP  . Fibroid   . Fibromyalgia   . Fibromyalgia syndrome   . Headache    HX MIGRAINES  . Hypertension   . 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 CESAREAN SECTION  03/20/2011   Procedure: CESAREAN SECTION;  Surgeon: Blane Ohara Meisinger;  Location: [facility];  Service: Gynecology;  Laterality: N/A;  Primary Female at 59  . MOUTH SURGERY     42 years old  . MYOMECTOMY  03/20/2011   Procedure: MYOMECTOMY;  Surgeon: Blane Ohara Meisinger;  Location: [facility];  Service: Gynecology;  Laterality: N/A;  . ORIF ANKLE FRACTURE Left 09/10/2017     Procedure: OPEN REDUCTION INTERNAL FIXATION (ORIF) LEFT MALLEOLUS ANKLE FRACTURE AND SYNDESMOSIS;  Surgeon: Altamese Ramer, MD;  Location: [facility];  Service: Orthopedics;  Laterality: Left;    Current Outpatient Medications on File Prior to Visit  Medication Sig Dispense Refill  . albuterol (PROVENTIL HFA;VENTOLIN HFA) 108 (90 Base) MCG/ACT inhaler USE 2 PUFFS INTO THE LUNGS EVERY 6 (SIX) HOURS AS NEEDED FOR WHEEZING. (Patient taking differently: Inhale 2 puffs into the lungs every 6 (six) hours as needed for wheezing. ) 8.5 Inhaler 2  . budesonide-formoterol (SYMBICORT) 80-4.5 MCG/ACT inhaler Inhale 2 puffs into the lungs 2 (two) times daily. May also inhale 2 puffs as needed (shortness of breath or wheezing). 1 Inhaler 3  . citalopram (CELEXA) 20 MG tablet Take 1 tablet (20 mg total) by mouth daily. 30 tablet 3  . diclofenac (VOLTAREN) 75 MG EC tablet Take 1 tablet (75 mg total) by mouth 2 (two) times daily. (Patient not taking: Reported on 04/08/2018) 20 tablet 0  . diphenhydramine-acetaminophen (TYLENOL PM EXTRA STRENGTH) 25-500 MG TABS tablet Take 2 tablets by mouth at bedtime as needed (sleep/pain).    . fluticasone (FLOVENT HFA) 110 MCG/ACT inhaler Inhale 2 puffs into the lungs 2 (two) times daily. (Patient not taking: Reported on 05/27/2018) 1 Inhaler 12  .  gabapentin (NEURONTIN) 100 MG capsule Take 2 capsules (200 mg total) by mouth 2 (two) times daily. 60 capsule 3  . lisinopril-hydrochlorothiazide (ZESTORETIC) 20-12.5 MG tablet Take 1 tablet by mouth daily. 90 tablet 3  . ondansetron (ZOFRAN ODT) 4 MG disintegrating tablet Take 1 tablet (4 mg total) by mouth every 8 (eight) hours as needed for nausea or vomiting. 20 tablet 0  . oxyCODONE (OXY IR/ROXICODONE) 5 MG immediate release tablet Take 1 tablet (5 mg total) by mouth every 6 (six) hours as needed for breakthrough pain (take between percocet for breakthrough pain only). (Patient not taking: Reported on 04/08/2018) 20 tablet 0  .  oxyCODONE-acetaminophen (PERCOCET/ROXICET) 5-325 MG tablet Take 1-2 tablets by mouth every 8 (eight) hours as needed. (Patient not taking: Reported on 04/08/2018) 50 tablet 0   No current facility-administered medications on file prior to visit.     Allergies  Allergen Reactions  . Pineapple Rash and Other (See Comments)     Blistering of the mouth  . Tomato Rash and Other (See Comments)    Blistering of the mouth  . Hydrocodone Nausea And Vomiting and Rash    Social History   Socioeconomic History  . Marital status: Single    Spouse name: Not on file  . Number of children: Not on file  . Years of education: Not on file  . Highest education level: Not on file  Occupational History  . Not on file  Social Needs  . Financial resource strain: Not on file  . Food insecurity:    Worry: Not on file    Inability: Not on file  . Transportation needs:    Medical: Not on file    Non-medical: Not on file  Tobacco Use  . Smoking status: Never Smoker  . Smokeless tobacco: Never Used  Substance and Sexual Activity  . Alcohol use: Yes    Alcohol/week: 1.0 standard drinks    Types: 1 Glasses of wine per week    Comment: 1 glass wine per day until known pregnancy at approx 12 weeks  . Drug use: No  . Sexual activity: Not Currently    Birth control/protection: None    Comment: FOB not currently involved  Lifestyle  . Physical activity:    Days per week: Not on file    Minutes per session: Not on file  . Stress: Not on file  Relationships  . Social connections:    Talks on phone: Not on file    Gets together: Not on file    Attends religious service: Not on file    Active member of club or organization: Not on file    Attends meetings of clubs or organizations: Not on file    Relationship status: Not on file  . Intimate partner violence:    Fear of current or ex partner: Not on file    Emotionally abused: Not on file    Physically abused: Not on file    Forced sexual activity:  Not on file  Other Topics Concern  . Not on file  Social History Narrative   Single Mom but dad is involved.  Dtr Levada Dy born 2012.       Family History  Problem Relation Age of Onset  . Birth defects Cousin        Down Syndrome  . Anesthesia problems Mother   . Anxiety disorder Mother   . Arthritis Mother   . Cancer Mother   . Depression Mother   . Diabetes  Mother   . Early death Mother   . Asthma Father   . Hypertension Father   . Alcohol abuse Brother   . Drug abuse Brother   . Alcohol abuse Maternal Uncle   . Diabetes Maternal Uncle   . Drug abuse Maternal Uncle   . Cancer Maternal Grandmother   . Stroke Maternal Grandmother   . Alcohol abuse Maternal Grandfather   . Cancer Maternal Grandfather   . Obesity Maternal Grandfather    The following portions of the patient's history were reviewed and updated as appropriate: allergies, current medications, past family history, past medical history, past social history, past surgical history and problem list.  Review of Systems Pertinent items are noted in HPI.   Objective:  BP (!) 152/94 (BP Location: Left Arm, Patient Position: Sitting)   Pulse (!) 102   Ht 4\' 9"  (1.448 m)   Wt 193 lb 11.2 oz (87.9 kg)   LMP 05/15/2018   BMI 41.92 kg/m  CONSTITUTIONAL: Well-developed, well-nourished female in no acute distress.  HENT:  Normocephalic, atraumatic, External right and left ear normal. Oropharynx is clear and moist EYES: Conjunctivae and EOM are normal. Pupils are equal, round, and reactive to light. No scleral icterus.  NECK: Normal range of motion, supple, no masses.  Normal thyroid.  SKIN: Skin is warm and dry. No rash noted. Not diaphoretic. No erythema. No pallor. NEUROLOGIC: Alert and oriented to person, place, and time. Normal reflexes, somewhat decreased muscle coordination. No cranial nerve deficit noted. PSYCHIATRIC: Normal mood and affect. Normal behavior. Normal judgment and thought content. CARDIOVASCULAR:  Normal heart rate noted, regular rhythm RESPIRATORY: Clear to auscultation bilaterally. Effort and breath sounds normal, no problems with respiration noted. BREASTS: Symmetric in size. No masses, skin changes, nipple drainage, or lymphadenopathy. ABDOMEN: Soft, normal bowel sounds, no distention noted.  No tenderness, rebound or guarding.  PELVIC: Normal appearing external genitalia; normal appearing vaginal mucosa and cervix.  No abnormal discharge noted.  Pap smear obtained. Mildly enlarged mobile uterus, no other palpable masses, no uterine or adnexal tenderness. MUSCULOSKELETAL: mildly limited range of motion. No tenderness.  No cyanosis, clubbing, or edema.  2+ distal pulses.  Assessment and Plan:   1. Well woman exam - MM Digital Screening; Future -benign exam  2. Pap smear for cervical cancer screening - Cytology - PAP( [facility])  3. Routine screening for STI (sexually transmitted infection) GC/CT/trichomonas - HIV Antibody (routine testing w rflx) - RPR - Hepatitis B surface antigen - Hepatitis C Antibody  4. Encounter for initial prescription of injectable contraceptive - POCT urine pregnancy Reviewed risks of depo, she desires Gave info for IUD  5. Uterine leiomyoma, unspecified location - US PELVIC COMPLETE WITH TRANSVAGINAL; Future  Will follow up results of pap smear/STI screen and manage accordingly. Encouraged improvement in diet and exercise.  Mammogram ordered Flu vaccine today   Routine preventative health maintenance measures emphasized. Please refer to After Visit Summary for other counseling recommendations.    Feliz Beam, M.D. [facility]

## 2018-05-27 NOTE — Patient Instructions (Signed)

## 2018-05-28 LAB — CERVICOVAGINAL ANCILLARY ONLY
Chlamydia: NEGATIVE
Neisseria Gonorrhea: NEGATIVE
Trichomonas: NEGATIVE

## 2018-05-28 LAB — RPR: RPR: NONREACTIVE

## 2018-05-28 LAB — HEPATITIS C ANTIBODY: Hep C Virus Ab: <0.1 s/co ratio (ref 0.0–0.9)

## 2018-05-28 LAB — HIV ANTIBODY (ROUTINE TESTING W REFLEX): HIV Screen 4th Generation wRfx: NONREACTIVE

## 2018-05-28 LAB — HEPATITIS B SURFACE ANTIGEN: HEP B S AG: NEGATIVE

## 2018-06-01 LAB — CYTOLOGY - PAP
DIAGNOSIS: NEGATIVE
HPV: NOT DETECTED

## 2018-06-10 ENCOUNTER — Ambulatory Visit (HOSPITAL_COMMUNITY): Payer: Medicaid Other

## 2018-06-15 ENCOUNTER — Telehealth: Payer: Self-pay | Admitting: *Deleted

## 2018-06-15 NOTE — Telephone Encounter (Signed)
Pt called to office for lab results.  LM on VM making pt aware of Negative results.

## 2018-06-16 ENCOUNTER — Ambulatory Visit (HOSPITAL_COMMUNITY): Payer: Medicaid Other

## 2018-06-21 ENCOUNTER — Ambulatory Visit (HOSPITAL_COMMUNITY)
Admission: RE | Admit: 2018-06-21 | Discharge: 2018-06-21 | Disposition: A | Discharge status: Home / Self Care | Payer: Medicaid Other | Source: Ambulatory Visit | Attending: Obstetrics and Gynecology | Admitting: Obstetrics and Gynecology

## 2018-06-21 DIAGNOSIS — D259 Leiomyoma of uterus, unspecified: Secondary | ICD-10-CM | POA: Diagnosis not present

## 2018-06-30 ENCOUNTER — Telehealth: Payer: Self-pay

## 2018-06-30 NOTE — Telephone Encounter (Signed)
Returned call, pt wanted to know results of U/S, routed to provider for review.

## 2018-07-02 ENCOUNTER — Telehealth: Payer: Self-pay | Admitting: *Deleted

## 2018-07-02 NOTE — Telephone Encounter (Signed)
Pt states that she is having asthma flareup.  Was told to go  to urgent care as we do not have any appts.  She states that she will go if she "gets worse" (her breathing was improving as we spoke).  Pt request appt for Monday. Scheduled for Monday, but again recommended to seek are today.  Pt says she will if it gets worse. [place], Salome Spotted, CMA

## 2018-07-05 ENCOUNTER — Ambulatory Visit: Payer: Self-pay

## 2018-07-05 ENCOUNTER — Telehealth: Payer: Self-pay

## 2018-07-05 NOTE — Telephone Encounter (Signed)
Returned call and advised pt that she can make appt with provider to discuss U/S results, pt agreed, advised scheduler will call.

## 2018-07-06 ENCOUNTER — Ambulatory Visit: Payer: Self-pay

## 2018-07-08 ENCOUNTER — Encounter: Payer: Self-pay | Admitting: Obstetrics and Gynecology

## 2018-07-08 ENCOUNTER — Ambulatory Visit: Payer: Medicaid Other | Admitting: Obstetrics and Gynecology

## 2018-07-08 VITALS — BP 151/104 | HR 96 | Wt 192.0 lb

## 2018-07-08 DIAGNOSIS — D250 Submucous leiomyoma of uterus: Secondary | ICD-10-CM | POA: Diagnosis not present

## 2018-07-08 DIAGNOSIS — R1030 Lower abdominal pain, unspecified: Secondary | ICD-10-CM | POA: Diagnosis not present

## 2018-07-08 NOTE — Progress Notes (Signed)
RGYN patient here to f/u on U/S results done on 06/21/18

## 2018-07-08 NOTE — Progress Notes (Signed)
GYNECOLOGY OFFICE FOLLOW UP NOTE  History:  42 y.o. G1P1001 here today for follow up for results of [place], she has some left sided abdominal pain and was concerned that her fibroids may have grown.    Past Medical History:  Diagnosis Date  . Abnormal Pap smear   . Asthma   . Cerebral palsy ([place])   . Depression    no medication treatment   . Family history of adverse reaction to anesthesia    MOTHER  HARD TIME WAKING UP  . Fibroid   . Fibromyalgia   . Fibromyalgia syndrome   . Headache    HX MIGRAINES  . Hypertension   . 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 CESAREAN SECTION  03/20/2011   Procedure: CESAREAN SECTION;  Surgeon: Blane Ohara Meisinger;  Location: [facility];  Service: Gynecology;  Laterality: N/A;  Primary Female at 13  . MOUTH SURGERY     42 years old  . MYOMECTOMY  03/20/2011   Procedure: MYOMECTOMY;  Surgeon: Blane Ohara Meisinger;  Location: [facility];  Service: Gynecology;  Laterality: N/A;  . ORIF ANKLE FRACTURE Left 09/10/2017   Procedure: OPEN REDUCTION INTERNAL FIXATION (ORIF) LEFT MALLEOLUS ANKLE FRACTURE AND SYNDESMOSIS;  Surgeon: Altamese Milo, MD;  Location: [facility];  Service: Orthopedics;  Laterality: Left;     Current Outpatient Medications:  .  albuterol (PROVENTIL HFA;VENTOLIN HFA) 108 (90 Base) MCG/ACT inhaler, USE 2 PUFFS INTO THE LUNGS EVERY 6 (SIX) HOURS AS NEEDED FOR WHEEZING. (Patient taking differently: Inhale 2 puffs into the lungs every 6 (six) hours as needed for wheezing. ), Disp: 8.5 Inhaler, Rfl: 2 .  budesonide-formoterol (SYMBICORT) 80-4.5 MCG/ACT inhaler, Inhale 2 puffs into the lungs 2 (two) times daily. May also inhale 2 puffs as needed (shortness of breath or wheezing)., Disp: 1 Inhaler, Rfl: 3 .  citalopram (CELEXA) 20 MG tablet, Take 1 tablet (20 mg total) by mouth daily., Disp: 30 tablet, Rfl: 3 .  diphenhydramine-acetaminophen (TYLENOL PM  EXTRA STRENGTH) 25-500 MG TABS tablet, Take 2 tablets by mouth at bedtime as needed (sleep/pain)., Disp: , Rfl:  .  gabapentin (NEURONTIN) 100 MG capsule, Take 2 capsules (200 mg total) by mouth 2 (two) times daily., Disp: 60 capsule, Rfl: 3 .  lisinopril-hydrochlorothiazide (ZESTORETIC) 20-12.5 MG tablet, Take 1 tablet by mouth daily., Disp: 90 tablet, Rfl: 3 .  medroxyPROGESTERone (DEPO-PROVERA) 150 MG/ML injection, Inject 1 mL (150 mg total) into the muscle every 3 (three) months., Disp: 1 mL, Rfl: 0 .  ondansetron (ZOFRAN ODT) 4 MG disintegrating tablet, Take 1 tablet (4 mg total) by mouth every 8 (eight) hours as needed for nausea or vomiting., Disp: 20 tablet, Rfl: 0 .  diclofenac (VOLTAREN) 75 MG EC tablet, Take 1 tablet (75 mg total) by mouth 2 (two) times daily. (Patient not taking: Reported on 04/08/2018), Disp: 20 tablet, Rfl: 0 .  fluticasone (FLOVENT HFA) 110 MCG/ACT inhaler, Inhale 2 puffs into the lungs 2 (two) times daily. (Patient not taking: Reported on 05/27/2018), Disp: 1 Inhaler, Rfl: 12 .  oxyCODONE (OXY IR/ROXICODONE) 5 MG immediate release tablet, Take 1 tablet (5 mg total) by mouth every 6 (six) hours as needed for breakthrough pain (take between percocet for breakthrough pain only). (Patient not taking: Reported on 04/08/2018), Disp: 20 tablet, Rfl: 0 .  oxyCODONE-acetaminophen (PERCOCET/ROXICET) 5-325 MG tablet, Take 1-2 tablets by mouth every 8 (eight)  hours as needed. (Patient not taking: Reported on 04/08/2018), Disp: 50 tablet, Rfl: 0  The following portions of the patient's history were reviewed and updated as appropriate: allergies, current medications, past family history, past medical history, past social history, past surgical history and problem list.   Review of Systems:  Pertinent items noted in HPI and remainder of comprehensive ROS otherwise negative.   Objective:  Physical Exam BP (!) 151/104   Pulse 96   Wt 192 lb (87.1 kg)   LMP 07/05/2018 (Exact Date)    BMI 41.55 kg/m  CONSTITUTIONAL: Well-developed, well-nourished female in no acute distress.  HENT:  Normocephalic, atraumatic. External right and left ear normal. Oropharynx is clear and moist EYES: Conjunctivae and EOM are normal. Pupils are equal, round, and reactive to light. No scleral icterus.  NECK: Normal range of motion, supple, no masses SKIN: Skin is warm and dry. No rash noted. Not diaphoretic. No erythema. No pallor. NEUROLOGIC: Alert and oriented to person, place, and time. Normal reflexes, muscle tone coordination. No cranial nerve deficit noted. PSYCHIATRIC: Normal mood and affect. Normal behavior. Normal judgment and thought content. CARDIOVASCULAR: Normal heart rate noted RESPIRATORY: Effort normal, no problems with respiration noted ABDOMEN: Soft, no distention noted.   PELVIC: deferred MUSCULOSKELETAL: Normal range of motion. No edema noted.  Labs and Imaging US Pelvic Complete With Transvaginal  Result Date: 06/21/2018 CLINICAL DATA:  Fibroid uterus EXAM: TRANSABDOMINAL AND TRANSVAGINAL ULTRASOUND OF PELVIS TECHNIQUE: Both transabdominal and transvaginal ultrasound examinations of the pelvis were performed. Transabdominal technique was performed for global imaging of the pelvis including uterus, ovaries, adnexal regions, and pelvic cul-de-sac. It was necessary to proceed with endovaginal exam following the transabdominal exam to visualize the uterus and LEFT ovary. COMPARISON:  None FINDINGS: Uterus Measurements: 11.0 x 5.5 x 7.1 cm = volume: 226.3 mL. Mildly heterogeneous echogenicity. At least 4 small uterine leiomyomata are identified, with the largest 2 measuring 3.0 x 2.3 x 2.8 cm intramural at the upper RIGHT uterine segment and 2.4 x 2.1 x 2.3 cm at the anterior uterus on LEFT. Endometrium Thickness: 12 mm.  No endometrial fluid or focal abnormality Right ovary Measurements: 4.2 x 2.6 x 2.5 cm = volume: 13.9 mL. Normal morphology without mass Left ovary Measurements: 3.6  x 2.2 x 2.0 cm = volume: 8.2 mL. Normal morphology without mass Other findings No free pelvic fluid or adnexal masses. IMPRESSION: Multiple small uterine leiomyomata as above. Electronically Signed   By: Lavonia Dana M.D.   On: 06/21/2018 11:56    Assessment & Plan:   1. Submucous leiomyoma of uterus Reviewed results of ultrasound, that fibroids are relatively small and that unless she is having significant pelvic pain or bleeding, would not recommend any further treatment at this time, she verbalizes understanding and is in agreement with this plan  2. Lower abdominal pain On discussion of her pain, she reports she feels knot in left abdominal wall just inferior to level of umbilicus, I can palpate an area of tissue somewhat harder than the surrounding area and suspect she may have scar tissue, reviewed s/s of bowel incarceration and she has none of that, suspect GI/bowel source for pain, she will review with PCP   Routine preventative health maintenance measures emphasized. Please refer to After Visit Summary for other counseling recommendations.   Return in about 1 year (around 07/09/2019) for annual.   Feliz Beam, M.D. Attending Center for [employer] [facility] farm manager)

## 2018-08-12 ENCOUNTER — Ambulatory Visit: Payer: Self-pay

## 2018-09-07 ENCOUNTER — Telehealth: Payer: Self-pay | Admitting: *Deleted

## 2018-09-07 ENCOUNTER — Other Ambulatory Visit: Payer: Self-pay | Admitting: Family Medicine

## 2018-09-07 DIAGNOSIS — M25579 Pain in unspecified ankle and joints of unspecified foot: Secondary | ICD-10-CM

## 2018-09-07 NOTE — Telephone Encounter (Signed)
RN case manager is requesting an order for a quad cane.   Dr. Garlan Fillers, if appropriate, please place order and let me know.  I will then send it to [facility] to process. Kennedee Kitzmiller, Salome Spotted, CMA

## 2018-09-07 NOTE — Progress Notes (Signed)
Per RN home nurse request

## 2018-09-07 NOTE — Telephone Encounter (Signed)
Dr. Garlan Fillers placed order.    Community message sent to [employer] and Darlina Guys @ [facility] to process DME order.   Fleeger, Salome Spotted, CMA

## 2018-09-17 ENCOUNTER — Ambulatory Visit: Payer: Medicaid Other | Admitting: Family Medicine

## 2018-09-17 VITALS — BP 140/90 | HR 102 | Temp 98.5°F | Wt 201.8 lb

## 2018-09-17 DIAGNOSIS — R1900 Intra-abdominal and pelvic swelling, mass and lump, unspecified site: Secondary | ICD-10-CM

## 2018-09-17 DIAGNOSIS — Z3042 Encounter for surveillance of injectable contraceptive: Secondary | ICD-10-CM | POA: Diagnosis not present

## 2018-09-17 DIAGNOSIS — I10 Essential (primary) hypertension: Secondary | ICD-10-CM

## 2018-09-17 DIAGNOSIS — R1904 Left lower quadrant abdominal swelling, mass and lump: Secondary | ICD-10-CM | POA: Insufficient documentation

## 2018-09-17 LAB — POCT URINE PREGNANCY: Preg Test, Ur: NEGATIVE

## 2018-09-17 MED ORDER — CITALOPRAM HYDROBROMIDE 20 MG PO TABS: 20.0000 mg | ORAL_TABLET | Freq: Every day | ORAL | 3 refills | Status: DC

## 2018-09-17 MED ORDER — LISINOPRIL-HYDROCHLOROTHIAZIDE 20-12.5 MG PO TABS: 1.0000 | ORAL_TABLET | Freq: Every day | ORAL | 3 refills | Status: DC

## 2018-09-17 MED ORDER — MEDROXYPROGESTERONE ACETATE 150 MG/ML IM SUSY
150.0000 mg | PREFILLED_SYRINGE | Freq: Once | INTRAMUSCULAR | Status: AC
  Administered 2018-09-17: 150 mg via INTRAMUSCULAR

## 2018-09-17 MED ORDER — GABAPENTIN 100 MG PO CAPS: 200.0000 mg | ORAL_CAPSULE | Freq: Two times a day (BID) | ORAL | 3 refills | Status: DC

## 2018-09-17 NOTE — Assessment & Plan Note (Signed)
Unable to palpate a mass today on exam. Unclear etiology but patient is obese which may limit my exam findings. Considered constipation has source of mass but patient is not having any symptoms to suggest she is carrying a large stool burden. Scar tissue in proposed location would be unusual as she has only had one abdominal surgery (c-section) many years ago and no other abdominal operations.  - Shared decision making with patient to get a CT scan to rule out any insidious cause of what the mass may be.

## 2018-09-17 NOTE — Progress Notes (Signed)
Patient presents for a depo shot. She was given and urine pregnancy which was negative.  Depo-Provera was administered in the upper outer right quadrant. Patient tolerated it well.  Patient given a reminder card to return May 9-23, 2020.  Marland KitchenOzella Almond, CMA

## 2018-09-17 NOTE — Patient Instructions (Signed)
It was great to see you today! Thank you for letting me participate in your care!  Today, we discussed your abdominal pain and the history of a mass and I ordered a CT scan. I will call you with results once we get them.  Be well, [name] Rutherford, DO PGY-2, [facility]

## 2018-09-17 NOTE — Addendum Note (Signed)
Addended by: Leonia Corona R on: 09/17/2018 05:40 PM   Modules accepted: Orders

## 2018-09-17 NOTE — Progress Notes (Signed)
Subjective: Chief Complaint  Patient presents with  . Results    HPI: [name] [name] is a 43 y.o. presenting to clinic today to discuss the following:  Depo Shot Patient desires Depo shot for contraception. Has been over 3 months since last urine preg test so getting urine pregnancy test. Give Depo today if negative.   Abdominal Mass Patient reported feeling it "a few months ago". She states it feels like a "stone" in her abdomen. She went to her OBGYN as she believed it was a fibroid. She was seen by Dr. Rosana Hoes in her office and she was able to palpate a mass. A transvaginal/transabdominal pelvic ultrasound showed fibroids but nothing else. Her OBGYN recommended she follow up with me. She has chronic upset stomach due to her IBS and this has not changed in the last few months. She is not losing weight, having night sweats, or fever, chills. She denies nausea or vomiting. She states it is tender at night if she lays on it. Patient has regular bowel movements every day which are soft and formed.  ROS noted in HPI.   Past Medical, Surgical, Social, and Family History Reviewed & Updated per EMR.   Pertinent Historical Findings include:   Social History   Tobacco Use  Smoking Status Never Smoker  Smokeless Tobacco Never Used   Objective: BP 140/90   Pulse (!) 102   Temp 98.5 F (36.9 C) (Oral)   Wt 201 lb 12.8 oz (91.5 kg)   SpO2 98%   BMI 43.67 kg/m  Vitals and nursing notes reviewed  Physical Exam Gen: Alert and Oriented x 3, NAD HEENT: Normocephalic, atraumatic CV: RRR, no murmurs, normal S1, S2 split Resp: CTAB, no wheezing, rales, or rhonchi, comfortable work of breathing Abd: obese, non-distended, tender in LUQ and LLQ, soft, +bs in all four quadrants, no palpable mass, no fluid wave, no hepatomegaly Ext: no clubbing, cyanosis, or edema Skin: warm, dry, intact, no rashes  Results for orders placed or performed in visit on 09/17/18 (from the past 72 hour(s))    POCT urine pregnancy     Status: None   Collection Time: 09/17/18  4:15 PM  Result Value Ref Range   Preg Test, Ur Negative Negative    Assessment/Plan:  Contraception management Depo shot given today after negative urine preg test - Return in 3 months  Intra-abdominal and pelvic swelling, mass and lump, unspecified site Unable to palpate a mass today on exam. Unclear etiology but patient is obese which may limit my exam findings. Considered constipation has source of mass but patient is not having any symptoms to suggest she is carrying a large stool burden. Scar tissue in proposed location would be unusual as she has only had one abdominal surgery (c-section) many years ago and no other abdominal operations.  - Shared decision making with patient to get a CT scan to rule out any insidious cause of what the mass may be.     PATIENT EDUCATION PROVIDED: See AVS    Diagnosis and plan along with any newly prescribed medication(s) were discussed in detail with this patient today. The patient verbalized understanding and agreed with the plan. Patient advised if symptoms worsen return to clinic or ER.    Orders Placed This Encounter  Procedures  . CT ABDOMEN PELVIS W WO CONTRAST    Wt. 201 / no diabetic /  hbp / no renal ca or sx / no dialysis / no kidney problems both kidneys /no  transplant / no needs / per Darrick Penna  not allergic to iv contrast Ins. mcd FH w [name] aware of prep and 301 Pt to p'u oral cm    Standing Status:   Future    Standing Expiration Date:   12/16/2019    Order Specific Question:   If indicated for the ordered procedure, I authorize the administration of contrast media per Radiology protocol    Answer:   Yes    Order Specific Question:   Is patient pregnant?    Answer:   No    Order Specific Question:   Preferred imaging location?    Answer:   [facility]    Order Specific Question:   Is Oral Contrast requested for this exam?    Answer:   Yes, Per  Radiology protocol    Order Specific Question:   Radiology Contrast Protocol - do NOT remove file path    Answer:   [email or web address]  . Basic Metabolic Panel  . POCT urine pregnancy    Meds ordered this encounter  Medications  . citalopram (CELEXA) 20 MG tablet    Sig: Take 1 tablet (20 mg total) by mouth daily.    Dispense:  30 tablet    Refill:  3  . gabapentin (NEURONTIN) 100 MG capsule    Sig: Take 2 capsules (200 mg total) by mouth 2 (two) times daily.    Dispense:  60 capsule    Refill:  3  . lisinopril-hydrochlorothiazide (ZESTORETIC) 20-12.5 MG tablet    Sig: Take 1 tablet by mouth daily.    Dispense:  90 tablet    Refill:  [place], DO 09/17/2018, 4:03 PM PGY-2 [place]

## 2018-09-17 NOTE — Assessment & Plan Note (Signed)
Depo shot given today after negative urine preg test - Return in 3 months

## 2018-09-18 LAB — BASIC METABOLIC PANEL
BUN/Creatinine Ratio: 12 (ref 9–23)
BUN: 8 mg/dL (ref 6–24)
CO2: 21 mmol/L (ref 20–29)
Calcium: 9.1 mg/dL (ref 8.7–10.2)
Chloride: 105 mmol/L (ref 96–106)
Creatinine, Ser: 0.66 mg/dL (ref 0.57–1.00)
GFR calc Af Amer: 128 mL/min/{1.73_m2} (ref >59)
GFR calc non Af Amer: 111 mL/min/{1.73_m2} (ref >59)
Glucose: 78 mg/dL (ref 65–99)
Potassium: 3.3 mmol/L — ABNORMAL LOW (ref 3.5–5.2)
Sodium: 141 mmol/L (ref 134–144)

## 2018-09-21 ENCOUNTER — Telehealth: Payer: Self-pay | Admitting: Family Medicine

## 2018-09-21 ENCOUNTER — Other Ambulatory Visit: Payer: Self-pay | Admitting: Family Medicine

## 2018-09-21 NOTE — Telephone Encounter (Signed)
[facility] called to say that the CT abdomin pelvis  does not need to be without contrast, since it will not make a difference. They just need the doctor to sign the new orders in [place]. jw

## 2018-09-22 ENCOUNTER — Other Ambulatory Visit: Payer: Self-pay | Admitting: Family Medicine

## 2018-09-22 DIAGNOSIS — R1900 Intra-abdominal and pelvic swelling, mass and lump, unspecified site: Secondary | ICD-10-CM

## 2018-09-22 NOTE — Progress Notes (Signed)
CT Abd Plvs w/contrast only ordered per radiology request

## 2018-09-23 ENCOUNTER — Telehealth: Payer: Self-pay | Admitting: Family Medicine

## 2018-09-23 NOTE — Telephone Encounter (Signed)
Disability placard form dropped off at front desk for completion.  Verified that patient section of form has been completed.  Last DOS/WCC with PCP was 09/17/18.  Placed form in team folder to be completed by clinical staff.  [name] [name]

## 2018-09-23 NOTE — Telephone Encounter (Signed)
Reviewed form for handicap placard and placed in PCP's box for completion.  .Welcome Fults R, CMA 

## 2018-09-24 ENCOUNTER — Ambulatory Visit
Admission: RE | Admit: 2018-09-24 | Discharge: 2018-09-24 | Disposition: A | Discharge status: Home / Self Care | Payer: Medicaid Other | Source: Ambulatory Visit | Attending: Family Medicine | Admitting: Family Medicine

## 2018-09-24 DIAGNOSIS — R1900 Intra-abdominal and pelvic swelling, mass and lump, unspecified site: Secondary | ICD-10-CM

## 2018-09-24 MED ORDER — IOPAMIDOL (ISOVUE-300) INJECTION 61%
100.0000 mL | Freq: Once | INTRAVENOUS | Status: AC | PRN
  Administered 2018-09-24: 125 mL via INTRAVENOUS

## 2018-10-01 ENCOUNTER — Telehealth: Payer: Self-pay

## 2018-10-01 NOTE — Telephone Encounter (Signed)
Patient calling for results of CT scan.  Call back is [phone].  Danley Danker, RN [facility] RN)

## 2018-10-04 NOTE — Telephone Encounter (Signed)
Patient called and informed of form ready for pick up.

## 2018-10-04 NOTE — Telephone Encounter (Signed)
Patient informed of normal results. Fleeger, Salome Spotted, CMA

## 2018-11-05 ENCOUNTER — Other Ambulatory Visit: Payer: Self-pay | Admitting: Family Medicine

## 2018-11-05 DIAGNOSIS — J4541 Moderate persistent asthma with (acute) exacerbation: Secondary | ICD-10-CM

## 2019-01-11 ENCOUNTER — Other Ambulatory Visit: Payer: Self-pay

## 2019-01-11 ENCOUNTER — Ambulatory Visit: Payer: Medicaid Other | Admitting: Family Medicine

## 2019-01-11 ENCOUNTER — Encounter: Payer: Self-pay | Admitting: Family Medicine

## 2019-01-11 DIAGNOSIS — M797 Fibromyalgia: Secondary | ICD-10-CM

## 2019-01-11 MED ORDER — GABAPENTIN 400 MG PO CAPS: 400.0000 mg | ORAL_CAPSULE | Freq: Three times a day (TID) | ORAL | 0 refills | Status: DC

## 2019-01-11 NOTE — Patient Instructions (Signed)
Was great meeting you today!  I am sorry your fibromyalgia has been giving you so much trouble.  I think increasing gabapentin is a good idea at this point.  Can stop taking the 200 mg capsules and I sent in 400 mg capsules.  Take this 3 times per day.  You can also take warm showers and apply heating pads as necessary.  Can also try and take capsaicin ointment, I gave you a handout for this.  Can try these measures and see if they work anticipation of your scheduled appointment with Dr. Garlan Fillers.

## 2019-01-13 ENCOUNTER — Encounter: Payer: Self-pay | Admitting: Family Medicine

## 2019-01-13 ENCOUNTER — Ambulatory Visit: Payer: Medicaid Other | Admitting: Family Medicine

## 2019-01-13 ENCOUNTER — Other Ambulatory Visit: Payer: Self-pay

## 2019-01-13 VITALS — BP 105/80 | HR 109

## 2019-01-13 DIAGNOSIS — M797 Fibromyalgia: Secondary | ICD-10-CM

## 2019-01-13 MED ORDER — AMITRIPTYLINE HCL 10 MG PO TABS: 10.0000 mg | ORAL_TABLET | Freq: Every day | ORAL | 0 refills | Status: DC

## 2019-01-13 MED ORDER — ALBUTEROL SULFATE HFA 108 (90 BASE) MCG/ACT IN AERS: 2.0000 | INHALATION_SPRAY | RESPIRATORY_TRACT | 2 refills | Status: DC | PRN
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
## 2019-01-13 NOTE — Patient Instructions (Signed)
It was great to see you today! Thank you for letting me participate in your care!  Today, we discussed your fibromyalgia flare. Continue the Gabapentin 400mg  TID and I will add Amitriptyline 10mg  daily at bedtime. I will follow up with you in one month to see if you have had any improvement.  If you have no improvement in 2 weeks call me and I will increase the dose.  Be well, Harolyn Rutherford, DO PGY-2, [facility]

## 2019-01-13 NOTE — Progress Notes (Signed)
     Subjective: Chief Complaint  Patient presents with  . Fibromyalgia     HPI: [name] [name] is a 43 y.o. presenting to clinic today to discuss the following:  Fibromyalgia Follow Up Patient was seen 2 days ago in clinic for an acute flare of fibromyalgia pain that was "all over". She was on Gabapentin and her regimen was changed to 400mg  TID which she says has helped some but she is still hurting "all over quite a bit". She states this feels exactly like her Fibromyalgia pain she has had in the past. She was recommended to get and try OTC Capsaicin cream which she has not done yet. She denies fever, cough, SOB, CP, abdominal pain, n/v/ diarrhea, or consitpation.    ROS noted in HPI.   Past Medical, Surgical, Social, and Family History Reviewed & Updated per EMR.   Pertinent Historical Findings include:   Social History   Tobacco Use  Smoking Status Never Smoker  Smokeless Tobacco Never Used   Objective: BP 105/80   Pulse (!) 109   LMP 01/06/2019 (Exact Date)   SpO2 98%  Vitals and nursing notes reviewed  Physical Exam Gen: Alert and Oriented x 3, NAD HEENT: Normocephalic, atraumatic CV: RRR, no murmurs, normal S1, S2 split Resp: CTAB, mild bibasilar wheezing, rales, or rhonchi, comfortable work of breathing Abd: non-distended, non-tender, soft, +bs in all four quadrants MSK: Moves all four extremities, using walker for ambulation Ext: no clubbing, cyanosis, or edema Neuro: No gross deficits  Skin: warm, dry, intact, no rashes  No results found for this or any previous visit (from the past 72 hour(s)).  Assessment/Plan:  Fibromyalgia Patient showing some improvement with Gabapentin 400mg  TID.  - Emphasized again the importance of remedies such as daily exercise, hot showers, heating pads, Capsaicin cream to help alleviate symptoms. - Will start with Amitriptyline 10mg  daily at night and increase as needed to help control symptoms. - F/u in 1 month   PATIENT  EDUCATION PROVIDED: See AVS    Diagnosis and plan along with any newly prescribed medication(s) were discussed in detail with this patient today. The patient verbalized understanding and agreed with the plan. Patient advised if symptoms worsen return to clinic or ER.    No orders of the defined types were placed in this encounter.   Meds ordered this encounter  Medications  . amitriptyline (ELAVIL) 10 MG tablet    Sig: Take 1 tablet (10 mg total) by mouth at bedtime.    Dispense:  30 tablet    Refill:  0  . albuterol (VENTOLIN HFA) 108 (90 Base) MCG/ACT inhaler    Sig: Inhale 2 puffs into the lungs every 4 (four) hours as needed for wheezing.    Dispense:  18 g    Refill:  2    .     Harolyn Rutherford, DO 01/13/2019, 2:04 PM PGY-2 [place]

## 2019-01-13 NOTE — Assessment & Plan Note (Signed)
Will increase gabapentin to 400 mg 3 times daily.  She states that the gabapentin 800 mg does help her but it wears off.  Is unclear why she is taking 800 mg once a day as this is not how her medication was prescribed.  Also stressed the importance of applying heating pads, taking warm showers, and topical ointments.  Recommended try capsaicin ointment this can sometimes be helpful for neuropathic pain.  Patient states that she is going to try these out before she sees her PCP on 6/18. -Gabapentin 400 mg 3 times daily -Liberal use of heating pads and warm showers -Capsaicin ointment as needed to affected areas -Follow-up with PCP on 01/13/2019

## 2019-01-15 NOTE — Assessment & Plan Note (Addendum)
Patient showing some improvement with Gabapentin 400mg  TID.  - Emphasized again the importance of remedies such as daily exercise, hot showers, heating pads, Capsaicin cream to help alleviate symptoms. - Will start with Amitriptyline 10mg  daily at night and increase as needed to help control symptoms. - F/u in 1 month

## 2019-01-25 ENCOUNTER — Other Ambulatory Visit: Payer: Self-pay | Admitting: Family Medicine

## 2019-02-08 ENCOUNTER — Other Ambulatory Visit: Payer: Self-pay | Admitting: Family Medicine

## 2019-02-16 ENCOUNTER — Other Ambulatory Visit: Payer: Self-pay

## 2019-02-16 ENCOUNTER — Ambulatory Visit: Payer: Medicaid Other | Admitting: Family Medicine

## 2019-02-16 ENCOUNTER — Encounter: Payer: Self-pay | Admitting: Family Medicine

## 2019-02-16 VITALS — BP 130/80 | HR 102

## 2019-02-16 DIAGNOSIS — M797 Fibromyalgia: Secondary | ICD-10-CM | POA: Diagnosis not present

## 2019-02-16 DIAGNOSIS — J4541 Moderate persistent asthma with (acute) exacerbation: Secondary | ICD-10-CM

## 2019-02-16 DIAGNOSIS — M25579 Pain in unspecified ankle and joints of unspecified foot: Secondary | ICD-10-CM | POA: Diagnosis not present

## 2019-02-16 MED ORDER — GABAPENTIN 400 MG PO CAPS: 400.0000 mg | ORAL_CAPSULE | Freq: Three times a day (TID) | ORAL | 3 refills | Status: DC

## 2019-02-16 MED ORDER — BUDESONIDE-FORMOTEROL FUMARATE 80-4.5 MCG/ACT IN AERO: 2.0000 | INHALATION_SPRAY | Freq: Two times a day (BID) | RESPIRATORY_TRACT | 3 refills | Status: DC

## 2019-02-16 MED ORDER — AMITRIPTYLINE HCL 10 MG PO TABS: 10.0000 mg | ORAL_TABLET | Freq: Every day | ORAL | 2 refills | Status: DC

## 2019-02-16 NOTE — Patient Instructions (Addendum)
It was great to see you today! Thank you for letting me participate in your care!  Today, we discussed that your Fibromyalgia seems to be under control. For now, I will continue Amitriptyline at the current dose and not increase it. If you are improved at the next visit we can stop this medication. I have also refilled your Gabapentin to help with the neuropathic pain symptoms.  I have also refilled your Symibcort. Please use it every day as prescribed.   Be well, Harolyn Rutherford, DO PGY-3, [facility]

## 2019-02-16 NOTE — Progress Notes (Signed)
Subjective: Chief Complaint  Patient presents with  . Follow-up    HPI: [name] [name] is a 43 y.o. presenting to clinic today to discuss the following:  F/u for Fibromyalgia Patient states since starting Amitriptyline her fibromyalgia flare has improved since her last appointment. She has been taking hot showers, using Casaicin cream, Gabapentin, and Amitriptyline. She is tolerating the Amitriptyline well and reports only "weird dreams" after having started taking it. She is still having some pain but it is not limiting her ADLs. She still endorses a small area of left lateral thigh numbness that is not spreading or getting worse. No groin pain or loss of sensation. No urinary or bowel incontinence.   No fever, chills, cough, SOB, abdominal pain, nausea, vomiting, or diarrhea.   ROS noted in HPI.   Past Medical, Surgical, Social, and Family History Reviewed & Updated per EMR.   Pertinent Historical Findings include:   Social History   Tobacco Use  Smoking Status Never Smoker  Smokeless Tobacco Never Used    Objective: BP 130/80   Pulse (!) 102   SpO2 98%  Vitals and nursing notes reviewed  Physical Exam Gen: Alert and Oriented x 3, NAD HEENT: Normocephalic, atraumatic CV: RRR, no murmurs, normal S1, S2 split Resp: CTAB, no wheezing, rales, or rhonchi, comfortable work of breathing MSK: No obvious deformity or ecchymosis to either lower extremity, non-tender to palpation at the greater trochanter, FROM at the hip and knee joint bilaterally in flexion, extension, abduction, and adduction, 5/5 strength bilaterally, gross sensation intact; reports left lateral thigh area of slight numbness Ext: no clubbing, cyanosis, or edema Skin: warm, dry, intact, no rashes  Assessment/Plan:  Fibromyalgia Symptoms improved with conservative therapy and addition of Amitriptyline. If she has another breakthrough flare I would consider increasing Amitriptyline as she has responded  well to a very low dose. She has very limited resources and will have difficulty doing the best recommended exercises for Fibromyalgia (swimming and water aerobics, etc). - Cont Amitriptyline 10mg  daily QHS - Con Gabapentin 400mg  TID - Cont conservative measures such as hot showers, heating pads, walking daily, and Capsaicin cream - referral to rheumatology as she said her old rheumatologist is no longer accepting patients. Referral placed. Appreciate any further recommendations to help manage her Fibromyalgia.   PATIENT EDUCATION PROVIDED: See AVS    Diagnosis and plan along with any newly prescribed medication(s) were discussed in detail with this patient today. The patient verbalized understanding and agreed with the plan. Patient advised if symptoms worsen return to clinic or ER.    Orders Placed This Encounter  Procedures  . Ambulatory referral to Rheumatology    Referral Priority:   Routine    Referral Type:   Consultation    Referral Reason:   Specialty Services Required    Requested Specialty:   Rheumatology    Number of Visits Requested:   1    Meds ordered this encounter  Medications  . budesonide-formoterol (SYMBICORT) 80-4.5 MCG/ACT inhaler    Sig: Inhale 2 puffs into the lungs 2 (two) times a day.    Dispense:  10.2 Inhaler    Refill:  3    DX Code Needed  .  . gabapentin (NEURONTIN) 400 MG capsule    Sig: Take 1 capsule (400 mg total) by mouth 3 (three) times daily.    Dispense:  90 capsule    Refill:  3  . amitriptyline (ELAVIL) 10 MG tablet    Sig: Take  1 tablet (10 mg total) by mouth at bedtime.    Dispense:  30 tablet    Refill:  2     Harolyn Rutherford, DO 02/16/2019, 1:40 PM PGY-3 [place]

## 2019-02-18 NOTE — Assessment & Plan Note (Addendum)
Symptoms improved with conservative therapy and addition of Amitriptyline. If she has another breakthrough flare I would consider increasing Amitriptyline as she has responded well to a very low dose. She has very limited resources and will have difficulty doing the best recommended exercises for Fibromyalgia (swimming and water aerobics, etc). - Cont Amitriptyline 10mg  daily QHS - Con Gabapentin 400mg  TID - Cont conservative measures such as hot showers, heating pads, walking daily, and Capsaicin cream - referral to rheumatology as she said her old rheumatologist is no longer accepting patients. Referral placed. Appreciate any further recommendations to help manage her Fibromyalgia.

## 2019-02-24 ENCOUNTER — Other Ambulatory Visit: Payer: Self-pay

## 2019-02-25 ENCOUNTER — Other Ambulatory Visit: Payer: Self-pay | Admitting: Critical Care Medicine

## 2019-02-25 DIAGNOSIS — Z20822 Contact with and (suspected) exposure to covid-19: Secondary | ICD-10-CM

## 2019-02-27 LAB — NOVEL CORONAVIRUS, NAA: SARS-CoV-2, NAA: NOT DETECTED

## 2019-03-04 DIAGNOSIS — F331 Major depressive disorder, recurrent, moderate: Secondary | ICD-10-CM | POA: Diagnosis not present

## 2019-03-16 DIAGNOSIS — F331 Major depressive disorder, recurrent, moderate: Secondary | ICD-10-CM | POA: Diagnosis not present

## 2019-04-13 DIAGNOSIS — F331 Major depressive disorder, recurrent, moderate: Secondary | ICD-10-CM | POA: Diagnosis not present

## 2019-04-25 ENCOUNTER — Encounter: Payer: Self-pay | Admitting: Family Medicine

## 2019-04-25 ENCOUNTER — Ambulatory Visit (INDEPENDENT_AMBULATORY_CARE_PROVIDER_SITE_OTHER): Payer: Medicare Other | Admitting: Family Medicine

## 2019-04-25 ENCOUNTER — Other Ambulatory Visit: Payer: Self-pay

## 2019-04-25 DIAGNOSIS — M25561 Pain in right knee: Secondary | ICD-10-CM

## 2019-04-25 DIAGNOSIS — Z23 Encounter for immunization: Secondary | ICD-10-CM | POA: Diagnosis not present

## 2019-04-25 NOTE — Progress Notes (Signed)
     Subjective: Chief Complaint  Patient presents with  . med check    HPI: [name] [name] is a 43 y.o. presenting to clinic today to discuss the following:  Medication management Patient is still taking amitriptyline at night time to help with her fibromyalgia symptoms. Given her living situation it is difficult for her to afford the best treatment which is exercise via water aerobics. I did encourage her to continue to stay active as much as possible by walking at least 30 min per for 5 times per week.  Knee Pain Patient with knee pain worse with walking that started about one week ago. Unknown cause and pain is intermittent, sharp, and responding to OTC medications.  ROS noted in HPI.    Social History   Tobacco Use  Smoking Status Never Smoker  Smokeless Tobacco Never Used    Objective: BP 138/82   Pulse (!) 102   SpO2 96%  Vitals and nursing notes reviewed  Physical Exam Knee, Right: TTP noted at the medial joint line. Inspection was negative for erythema, ecchymosis, mild effusion present. No obvious bony abnormalities or signs of osteophyte development. Palpation yielded no asymmetric warmth; Medial joint line tenderness; No condyle tenderness; No patellar tenderness; Mild patellar crepitus. Patellar and quadriceps tendons unremarkable, and no tenderness of the pes anserine bursa. No obvious Baker's cyst development. ROM normal in flexion (135 degrees) and extension (0 degrees). Normal hamstring and quadriceps strength. Neurovascularly intact bilaterally.  - Ligaments: (Solid and consistent endpoints)   - ACL (present bilaterally)   - PCL (present bilaterally)   - LCL (present bilaterally)   - MCL (present bilaterally).   - Additional tests performed:    - Anterior Drawer >> NEG  - Meniscus:   - Thessaly: Positive  - Patella:   - Patellar grind/compression: Positive   - Patellar glide: Without apprehension  Assessment/Plan:  Right knee pain OA vs meniscus  tear vs fibromyalgia. Her acute pain does seem different so I don't think this is related to her ongoing issues with body pain. TTP along medial joint line with worse on movement and positive thessaly test.  - Patient would not be a good surgical candidate and would probably not get much benefit from surgery for meniscus repair. Could consider menisectomy in the future if she continues to have pain.  Fibromyalgia Cont amitriptyline for one more month and then stop. Can always restart at a later date if she has another flare up.  PATIENT EDUCATION PROVIDED: See AVS    Diagnosis and plan along with any newly prescribed medication(s) were discussed in detail with this patient today. The patient verbalized understanding and agreed with the plan. Patient advised if symptoms worsen return to clinic or ER.   Health Maintainance: influenza vaccine   Orders Placed This Encounter  Procedures  . Flu Vaccine QUAD 36+ mos IM    No orders of the defined types were placed in this encounter.    Harolyn Rutherford, DO 04/25/2019, 1:52 PM PGY-3 [place]

## 2019-04-25 NOTE — Patient Instructions (Signed)
Meniscus Tear    A meniscus tear is a knee injury that happens when a piece of the meniscus is torn. The meniscus is a thick, rubbery, wedge-shaped cartilage in the knee. Two menisci are located in each knee. They sit between the upper bone (femur) and lower bone (tibia) that make up the knee joint. Each meniscus acts as a shock absorber for the knee.  A torn meniscus is one of the most common types of knee injuries. This injury can range from mild to severe. Surgery may be needed to repair a severe tear.  What are the causes?  This condition may be caused by any kneeling, squatting, twisting, or pivoting movement. Sports-related injuries are the most common cause. These often occur from:  · Running and stopping suddenly.  ? Changing direction.  ? Being tackled or knocked off your feet.  · Lifting or carrying heavy weights.  As people get older, their menisci get thinner and weaker. In these people, tears can happen more easily, such as from climbing stairs.  What increases the risk?  You are more likely to develop this condition if you:  · Play contact sports.  · Have a job that requires kneeling or squatting.  · Are female.  · Are over 40 years old.  What are the signs or symptoms?  Symptoms of this condition include:  · Knee pain, especially at the side of the knee joint. You may feel pain when the injury occurs, or you may only hear a pop and feel pain later.  · A feeling that your knee is clicking, catching, locking, or giving way.  · Not being able to fully bend or extend your knee.  · Bruising or swelling in your knee.  How is this diagnosed?  This condition may be diagnosed based on your symptoms and a physical exam.  You may also have tests, such as:  · X-rays.  · MRI.  · A procedure to look inside your knee with a narrow surgical telescope (arthroscopy).  You may be referred to a knee specialist (orthopedic surgeon).  How is this treated?  Treatment for this injury depends on the severity of the tear.  Treatment for a mild tear may include:  · Rest.  · Medicine to reduce pain and swelling. This is usually a nonsteroidal anti-inflammatory drug (NSAID), like ibuprofen.  · A knee brace, sleeve, or wrap.  · Using crutches or a walker to keep weight off your knee and to help you walk.  · Exercises to strengthen your knee (physical therapy).  You may need surgery if you have a severe tear or if other treatments are not working.  Follow these instructions at home:  If you have a brace, sleeve, or wrap:  · Wear it as told by your health care provider. Remove it only as told by your health care provider.  · Loosen the brace, sleeve, or wrap if your toes tingle, become numb, or turn cold and blue.  · Keep the brace, sleeve, or wrap clean and dry.  · If the brace, sleeve, or wrap is not waterproof:  ? Do not let it get wet.  ? Cover it with a watertight covering when you take a bath or shower.  Managing pain and swelling    · Take over-the-counter and prescription medicines only as told by your health care provider.  · If directed, put ice on your knee:  ? If you have a removable brace, sleeve, or wrap, remove it   elevate) the injured area above the level of your heart while you are sitting or lying down. Activity  Do not use the injured limb to support your body weight until your health care provider says that you can. Use crutches or a walker as told by your health care provider.  Return to your normal activities as told by your health care provider. Ask your health care provider what activities are safe for you.  Perform range-of-motion exercises only as told by your health care provider.  Begin doing exercises to strengthen your knee and leg muscles only as told by your  health care provider. After you recover, your health care provider may recommend these exercises to help prevent another injury. General instructions  Use a knee brace, sleeve, or wrap as told by your health care provider.  Ask your health care provider when it is safe to drive if you have a brace, sleeve, or wrap on your knee.  Do not use any products that contain nicotine or tobacco, such as cigarettes, e-cigarettes, and chewing tobacco. If you need help quitting, ask your health care provider.  Ask your health care provider if the medicine prescribed to you: ? Requires you to avoid driving or using heavy machinery. ? Can cause constipation. You may need to take these actions to prevent or treat constipation:  Drink enough fluid to keep your urine pale yellow.  Take over-the-counter or prescription medicines.  Eat foods that are high in fiber, such as beans, whole grains, and fresh fruits and vegetables.  Limit foods that are high in fat and processed sugars, such as fried or sweet foods.  Keep all follow-up visits as told by your health care provider. This is important. Contact a health care provider if:  You have a fever.  Your knee becomes red, tender, or swollen.  Your pain medicine is not helping.  Your symptoms get worse or do not improve after 2 weeks of home care. Summary  A meniscus tear is a knee injury that happens when a piece of the meniscus is torn.  Treatment for this injury depends on the severity of the tear. You may need surgery if you have a severe tear or if other treatments are not working.  Rest, ice, and raise (elevate) your injured knee as told by your health care provider. This will help lessen pain and swelling.  Contact a health care provider if you have new symptoms, or your symptoms get worse or do not improve after 2 weeks of home care.  Keep all follow-up visits as told by your health care provider. This is important. This information is not  intended to replace advice given to you by your health care provider. Make sure you discuss any questions you have with your health care provider. Document Released: 10/04/2002 Document Revised: 01/26/2018 Document Reviewed: 01/26/2018 Elsevier Patient Education  2020 [employer].

## 2019-04-28 DIAGNOSIS — M25561 Pain in right knee: Secondary | ICD-10-CM | POA: Insufficient documentation

## 2019-04-28 NOTE — Assessment & Plan Note (Signed)
OA vs meniscus tear vs fibromyalgia. Her acute pain does seem different so I don't think this is related to her ongoing issues with body pain. TTP along medial joint line with worse on movement and positive thessaly test.  - Patient would not be a good surgical candidate and would probably not get much benefit from surgery for meniscus repair. Could consider menisectomy in the future if she continues to have pain.

## 2019-05-11 DIAGNOSIS — F331 Major depressive disorder, recurrent, moderate: Secondary | ICD-10-CM | POA: Diagnosis not present

## 2019-05-23 DIAGNOSIS — F331 Major depressive disorder, recurrent, moderate: Secondary | ICD-10-CM | POA: Diagnosis not present

## 2019-06-10 ENCOUNTER — Encounter (HOSPITAL_COMMUNITY): Payer: Self-pay

## 2019-06-10 ENCOUNTER — Other Ambulatory Visit: Payer: Self-pay

## 2019-06-10 ENCOUNTER — Emergency Department (HOSPITAL_COMMUNITY)
Admission: EM | Admit: 2019-06-10 | Discharge: 2019-06-11 | Disposition: A | Discharge status: Home / Self Care | Payer: Medicare Other | Attending: Emergency Medicine | Admitting: Emergency Medicine

## 2019-06-10 ENCOUNTER — Emergency Department (HOSPITAL_COMMUNITY): Payer: Medicare Other

## 2019-06-10 DIAGNOSIS — M25461 Effusion, right knee: Secondary | ICD-10-CM | POA: Diagnosis not present

## 2019-06-10 DIAGNOSIS — I10 Essential (primary) hypertension: Secondary | ICD-10-CM | POA: Insufficient documentation

## 2019-06-10 DIAGNOSIS — Y9389 Activity, other specified: Secondary | ICD-10-CM | POA: Diagnosis not present

## 2019-06-10 DIAGNOSIS — M25561 Pain in right knee: Secondary | ICD-10-CM | POA: Diagnosis not present

## 2019-06-10 DIAGNOSIS — J45909 Unspecified asthma, uncomplicated: Secondary | ICD-10-CM | POA: Diagnosis not present

## 2019-06-10 DIAGNOSIS — I959 Hypotension, unspecified: Secondary | ICD-10-CM | POA: Diagnosis not present

## 2019-06-10 DIAGNOSIS — R0902 Hypoxemia: Secondary | ICD-10-CM | POA: Diagnosis not present

## 2019-06-10 DIAGNOSIS — Y998 Other external cause status: Secondary | ICD-10-CM | POA: Diagnosis not present

## 2019-06-10 DIAGNOSIS — W06XXXA Fall from bed, initial encounter: Secondary | ICD-10-CM | POA: Insufficient documentation

## 2019-06-10 DIAGNOSIS — R52 Pain, unspecified: Secondary | ICD-10-CM | POA: Diagnosis not present

## 2019-06-10 DIAGNOSIS — W19XXXA Unspecified fall, initial encounter: Secondary | ICD-10-CM

## 2019-06-10 DIAGNOSIS — Y92013 Bedroom of single-family (private) house as the place of occurrence of the external cause: Secondary | ICD-10-CM | POA: Diagnosis not present

## 2019-06-10 DIAGNOSIS — M7989 Other specified soft tissue disorders: Secondary | ICD-10-CM | POA: Diagnosis not present

## 2019-06-10 DIAGNOSIS — Z4789 Encounter for other orthopedic aftercare: Secondary | ICD-10-CM | POA: Diagnosis not present

## 2019-06-10 DIAGNOSIS — M25572 Pain in left ankle and joints of left foot: Secondary | ICD-10-CM | POA: Diagnosis not present

## 2019-06-10 MED ORDER — FENTANYL CITRATE (PF) 100 MCG/2ML IJ SOLN
50.0000 ug | Freq: Once | INTRAMUSCULAR | Status: AC
  Administered 2019-06-10: 50 ug via INTRAMUSCULAR
  Filled 2019-06-10: qty 2

## 2019-06-10 NOTE — ED Triage Notes (Signed)
Pt BIB EMS from home. Pt states she was trying to sit on the bed, pt uses cane to walk, and pt slipped on to the floor. Pt states she was on the floor on her knees for over 2 hours. Pt denies hitting her head, denies LOC. Pt c/o pain to R knee and L ankle. Pt states she has a restraining order on her daughters father (41 year old daughter bedside with patient) and patient does not wish daughters father to come to her room.

## 2019-06-10 NOTE — ED Notes (Signed)
Patients friend, [name] [name], took patients phone and patients daughter home with her. Patient states that Jeannene Patella has permission to do this.

## 2019-06-10 NOTE — ED Provider Notes (Signed)
[facility] Emergency Department Provider Note MRN:  [id number]  Arrival date & time: 06/11/19     Chief Complaint   Fall, Knee Pain, and Ankle Pain   History of Present Illness   [name] [name] is a 43 y.o. year-old female with a history of cerebral palsy, fibromyalgia presenting to the ED with chief complaint of fall.  Patient was trying to get into bed when she lost her balance and fell off the edge of the bed.  No head trauma, no loss of consciousness.  Is endorsing right knee pain as well as left ankle pain.  She has chronic pain in both of these locations but the pain is worse than normal.  Denies chest pain or shortness of breath, no abdominal pain.  Pain is moderate severity, constant, worse with motion.  Review of Systems  A complete 10 system review of systems was obtained and all systems are negative except as noted in the HPI and PMH.   Patient's Health History    Past Medical History:  Diagnosis Date  . Abnormal Pap smear   . Asthma   . Cerebral palsy ([place])   . Depression    no medication treatment   . Family history of adverse reaction to anesthesia    MOTHER  HARD TIME WAKING UP  . Fibroid   . Fibromyalgia   . Fibromyalgia syndrome   . Headache    HX MIGRAINES  . Hypertension   . 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 CESAREAN SECTION  03/20/2011   Procedure: CESAREAN SECTION;  Surgeon: Blane Ohara Meisinger;  Location: [facility];  Service: Gynecology;  Laterality: N/A;  Primary Female at 80  . MOUTH SURGERY     43 years old  . MYOMECTOMY  03/20/2011   Procedure: MYOMECTOMY;  Surgeon: Blane Ohara Meisinger;  Location: [facility];  Service: Gynecology;  Laterality: N/A;  . ORIF ANKLE FRACTURE Left 09/10/2017   Procedure: OPEN REDUCTION INTERNAL FIXATION (ORIF) LEFT MALLEOLUS ANKLE FRACTURE AND SYNDESMOSIS;  Surgeon: Altamese Thayer, MD;  Location: [facility];  Service: Orthopedics;  Laterality: Left;    Family History  Problem Relation Age of Onset  . Birth defects Cousin        Down Syndrome  . Anesthesia problems Mother   . Anxiety disorder Mother   . Arthritis Mother   . Cancer Mother   . Depression Mother   . Diabetes Mother   . Early death Mother   . Asthma Father   . Hypertension Father   . Alcohol abuse Brother   . Drug abuse Brother   . Alcohol abuse Maternal Uncle   . Diabetes Maternal Uncle   . Drug abuse Maternal Uncle   . Cancer Maternal Grandmother   . Stroke Maternal Grandmother   . Alcohol abuse Maternal Grandfather   . Cancer Maternal Grandfather   . Obesity Maternal Grandfather     Social History   Socioeconomic History  . Marital status: Single    Spouse name: Not on file  . Number of children: Not on file  . Years of education: Not on file  . Highest education level: Not on file  Occupational History  . Not on file  Social Needs  . Financial resource strain: Not on file  . Food insecurity    Worry: Not on file    Inability: Not on file  .  Transportation needs    Medical: Not on file    Non-medical: Not on file  Tobacco Use  . Smoking status: Never Smoker  . Smokeless tobacco: Never Used  Substance and Sexual Activity  . Alcohol use: Yes    Alcohol/week: 1.0 standard drinks    Types: 1 Glasses of wine per week    Comment: 1 glass wine per day until known pregnancy at approx 12 weeks  . Drug use: No  . Sexual activity: Not Currently    Birth control/protection: None    Comment: FOB not currently involved  Lifestyle  . Physical activity    Days per week: Not on file    Minutes per session: Not on file  . Stress: Not on file  Relationships  . Social Herbalist on phone: Not on file    Gets together: Not on file    Attends religious service: Not on file    Active member of club or organization: Not on file    Attends meetings of clubs or organizations: Not on file     Relationship status: Not on file  . Intimate partner violence    Fear of current or ex partner: Not on file    Emotionally abused: Not on file    Physically abused: Not on file    Forced sexual activity: Not on file  Other Topics Concern  . Not on file  Social History Narrative   Single Mom but dad is involved.  Dtr Levada Dy born 2010.        Physical Exam  Vital Signs and Nursing Notes reviewed Vitals:   06/10/19 2246 06/10/19 2300  BP: 99/61 105/71  Pulse: (!) 114 (!) 111  Resp: 18 18  Temp:    SpO2: 95% 95%    CONSTITUTIONAL: Well-appearing, NAD NEURO:  Alert and oriented x 3, no focal deficits EYES:  eyes equal and reactive ENT/NECK:  no LAD, no JVD CARDIO: Regular rate, well-perfused, normal S1 and S2 PULM:  CTAB no wheezing or rhonchi GI/GU:  normal bowel sounds, non-distended, non-tender MSK/SPINE:  No gross deformities, no edema; moderate tenderness to palpation of the right knee as well as the left ankle, range of motion intact SKIN:  no rash, atraumatic PSYCH:  Appropriate speech and behavior  Diagnostic and Interventional Summary    EKG Interpretation  Date/Time:    Ventricular Rate:    PR Interval:    QRS Duration:   QT Interval:    QTC Calculation:   R Axis:     Text Interpretation:        Labs Reviewed - No data to display  DG Knee Complete 4 Views Right  Final Result    DG Ankle Complete Left  Final Result      Medications  fentaNYL (SUBLIMAZE) injection 50 mcg (50 mcg Intramuscular Given 06/10/19 2059)     Procedures  /  Critical Care Procedures  ED Course and Medical Decision Making  I have reviewed the triage vital signs and the nursing notes.  Pertinent labs & imaging results that were available during my care of the patient were reviewed by me and considered in my medical decision making (see below for details).     X-rays to evaluate for fracture or complication related to hardware.  Anticipating discharge.  X-rays are without  acute fracture, patient is appropriate for discharge with orthopedic follow-up.  Barth Kirks. Sedonia Small, [place] [email or web address]  Final Clinical  Impressions(s) / ED Diagnoses     ICD-10-CM   1. Fall, initial encounter  W19.XXXA   2. Acute pain of right knee  M25.561   3. Acute left ankle pain  M25.572     ED Discharge Orders    None       Discharge Instructions Discussed with and Provided to Patient:     Discharge Instructions     You were evaluated in the Emergency Department and after careful evaluation, we did not find any emergent condition requiring admission or further testing in the hospital.  Your exam/testing today is overall reassuring.  Your x-rays did not show any broken bones.  Please follow-up with your orthopedic specialists.  Please return to the Emergency Department if you experience any worsening of your condition.  We encourage you to follow up with a primary care provider.  Thank you for allowing [place] to be a part of your care.       Maudie Flakes, MD 06/11/19 [phone]

## 2019-06-10 NOTE — ED Notes (Signed)
PureWick placed.

## 2019-06-11 NOTE — Discharge Instructions (Addendum)
You were evaluated in the Emergency Department and after careful evaluation, we did not find any emergent condition requiring admission or further testing in the hospital.  Your exam/testing today is overall reassuring.  Your x-rays did not show any broken bones.  Please follow-up with your orthopedic specialists.  Please return to the Emergency Department if you experience any worsening of your condition.  We encourage you to follow up with a primary care provider.  Thank you for allowing [place] to be a part of your care.

## 2019-07-06 DIAGNOSIS — M25572 Pain in left ankle and joints of left foot: Secondary | ICD-10-CM | POA: Diagnosis not present

## 2019-07-06 DIAGNOSIS — M1711 Unilateral primary osteoarthritis, right knee: Secondary | ICD-10-CM | POA: Diagnosis not present

## 2019-07-06 DIAGNOSIS — M65161 Other infective (teno)synovitis, right knee: Secondary | ICD-10-CM | POA: Diagnosis not present

## 2019-07-13 ENCOUNTER — Other Ambulatory Visit: Payer: Self-pay

## 2019-07-13 MED ORDER — GABAPENTIN 400 MG PO CAPS: 400.0000 mg | ORAL_CAPSULE | Freq: Three times a day (TID) | ORAL | 3 refills | Status: DC

## 2019-07-19 ENCOUNTER — Other Ambulatory Visit: Payer: Self-pay | Admitting: *Deleted

## 2019-07-19 MED ORDER — CITALOPRAM HYDROBROMIDE 20 MG PO TABS: 20.0000 mg | ORAL_TABLET | Freq: Every day | ORAL | 3 refills | Status: DC

## 2019-08-01 ENCOUNTER — Other Ambulatory Visit: Payer: Self-pay

## 2019-08-01 MED ORDER — AMITRIPTYLINE HCL 10 MG PO TABS: 10.0000 mg | ORAL_TABLET | Freq: Every day | ORAL | 2 refills | Status: DC

## 2019-08-11 DIAGNOSIS — F331 Major depressive disorder, recurrent, moderate: Secondary | ICD-10-CM | POA: Diagnosis not present

## 2019-08-23 ENCOUNTER — Other Ambulatory Visit: Payer: Self-pay

## 2019-08-23 DIAGNOSIS — J4541 Moderate persistent asthma with (acute) exacerbation: Secondary | ICD-10-CM

## 2019-08-23 MED ORDER — BUDESONIDE-FORMOTEROL FUMARATE 80-4.5 MCG/ACT IN AERO: 2.0000 | INHALATION_SPRAY | Freq: Every day | RESPIRATORY_TRACT | 3 refills | Status: DC | PRN

## 2019-09-04 ENCOUNTER — Other Ambulatory Visit: Payer: Self-pay

## 2019-09-04 ENCOUNTER — Encounter (HOSPITAL_COMMUNITY): Payer: Self-pay

## 2019-09-04 ENCOUNTER — Emergency Department (HOSPITAL_COMMUNITY)
Admission: EM | Admit: 2019-09-04 | Discharge: 2019-09-04 | Disposition: A | Discharge status: Home / Self Care | Payer: Medicare Other | Attending: Emergency Medicine | Admitting: Emergency Medicine

## 2019-09-04 DIAGNOSIS — R531 Weakness: Secondary | ICD-10-CM | POA: Diagnosis not present

## 2019-09-04 DIAGNOSIS — G809 Cerebral palsy, unspecified: Secondary | ICD-10-CM | POA: Insufficient documentation

## 2019-09-04 DIAGNOSIS — I10 Essential (primary) hypertension: Secondary | ICD-10-CM | POA: Insufficient documentation

## 2019-09-04 DIAGNOSIS — R296 Repeated falls: Secondary | ICD-10-CM

## 2019-09-04 DIAGNOSIS — W19XXXA Unspecified fall, initial encounter: Secondary | ICD-10-CM | POA: Diagnosis not present

## 2019-09-04 DIAGNOSIS — Z79899 Other long term (current) drug therapy: Secondary | ICD-10-CM | POA: Insufficient documentation

## 2019-09-04 LAB — CBC WITH DIFFERENTIAL/PLATELET
Abs Immature Granulocytes: 0.02 10*3/uL (ref 0.00–0.07)
Basophils Absolute: 0 10*3/uL (ref 0.0–0.1)
Basophils Relative: 1 %
Eosinophils Absolute: 0 10*3/uL (ref 0.0–0.5)
Eosinophils Relative: 1 %
HCT: 42 % (ref 36.0–46.0)
Hemoglobin: 13.8 g/dL (ref 12.0–15.0)
Immature Granulocytes: 0 %
Lymphocytes Relative: 36 %
Lymphs Abs: 1.8 10*3/uL (ref 0.7–4.0)
MCH: 30.1 pg (ref 26.0–34.0)
MCHC: 32.9 g/dL (ref 30.0–36.0)
MCV: 91.7 fL (ref 80.0–100.0)
Monocytes Absolute: 0.4 10*3/uL (ref 0.1–1.0)
Monocytes Relative: 7 %
Neutro Abs: 2.7 10*3/uL (ref 1.7–7.7)
Neutrophils Relative %: 55 %
Platelets: 375 10*3/uL (ref 150–400)
RBC: 4.58 MIL/uL (ref 3.87–5.11)
RDW: 13.9 % (ref 11.5–15.5)
WBC: 5 10*3/uL (ref 4.0–10.5)
nRBC: 0 % (ref 0.0–0.2)

## 2019-09-04 LAB — URINALYSIS, ROUTINE W REFLEX MICROSCOPIC
Bilirubin Urine: NEGATIVE
Glucose, UA: NEGATIVE mg/dL
Hgb urine dipstick: NEGATIVE
Ketones, ur: NEGATIVE mg/dL
Leukocytes,Ua: NEGATIVE
Nitrite: NEGATIVE
Protein, ur: NEGATIVE mg/dL
Specific Gravity, Urine: 1.015 (ref 1.005–1.030)
pH: 5 (ref 5.0–8.0)

## 2019-09-04 LAB — BASIC METABOLIC PANEL
Anion gap: 11 (ref 5–15)
BUN: 13 mg/dL (ref 6–20)
CO2: 26 mmol/L (ref 22–32)
Calcium: 8.7 mg/dL — ABNORMAL LOW (ref 8.9–10.3)
Chloride: 101 mmol/L (ref 98–111)
Creatinine, Ser: 0.76 mg/dL (ref 0.44–1.00)
GFR calc Af Amer: >60 mL/min (ref >60)
GFR calc non Af Amer: >60 mL/min (ref >60)
Glucose, Bld: 112 mg/dL — ABNORMAL HIGH (ref 70–99)
Potassium: 4 mmol/L (ref 3.5–5.1)
Sodium: 138 mmol/L (ref 135–145)

## 2019-09-04 NOTE — ED Triage Notes (Signed)
Per EMS,  Pt is coming from home. Pt had a fall today, and yesterday. No obvious injury noted. Pt has a hx of cerebral palsy and fibromyalgia. Pt also has hx of IBS and had one episode of induced emesis, due to nausea. Pt called out today wanted to be evaluated for her weakness causing the falls stating that it is unusual for her.

## 2019-09-06 NOTE — ED Provider Notes (Signed)
[facility] DEPT Provider Note   CSN: [id number] Arrival date & time: 09/04/19  [id number]     History Chief Complaint  Patient presents with  . Weakness    [name] [name] is a 44 y.o. female.  HPI   44 year old female presenting for evaluation after recent falls.  She has a history of cerebral palsy and fibromyalgia.  She normally ambulates with a cane.  She fell twice in the past day.  This is unusual for her and she felt like she needed to be checked out.  She has no acute complaints otherwise.  Chronic pain.  Denies any acute injury from these falls.  No fevers.  No acute urinary complaints.  History of IBS.  No acute GI symptoms.  Past Medical History:  Diagnosis Date  . Abnormal Pap smear   . Asthma   . Cerebral palsy ([place])   . Depression    no medication treatment   . Family history of adverse reaction to anesthesia    MOTHER  HARD TIME WAKING UP  . Fibroid   . Fibromyalgia   . Fibromyalgia syndrome   . Headache    HX MIGRAINES  . Hypertension   . 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 Right knee pain 04/28/2019  . Abdominal mass, left lower quadrant 09/17/2018  . Intra-abdominal and pelvic swelling, mass and lump, unspecified site 09/17/2018  . Pruritus 04/21/2017  . Fibroids 01/14/2017  . Axillary mass 12/03/2016  . Abnormal transaminases 08/22/2015  . Alcohol abuse 08/22/2015  . Fibromyalgia 05/03/2015  . Depression 10/12/2013  . Contraception management 10/12/2013  . Hypertension 06/18/2009  . CEREBRAL PALSY 09/24/2006  . Asthma 09/24/2006  . IRRITABLE BOWEL SYNDROME 09/24/2006    Past Surgical History:  Procedure Laterality Date  . CESAREAN SECTION  03/20/2011   Procedure: CESAREAN SECTION;  Surgeon: Blane Ohara Meisinger;  Location: [facility];  Service: Gynecology;  Laterality: N/A;  Primary Female at 22  . MOUTH SURGERY     44 years old  . MYOMECTOMY  03/20/2011   Procedure: MYOMECTOMY;  Surgeon: Blane Ohara Meisinger;  Location: [facility];  Service: Gynecology;  Laterality: N/A;  . ORIF ANKLE FRACTURE Left 09/10/2017   Procedure: OPEN REDUCTION INTERNAL FIXATION (ORIF) LEFT MALLEOLUS ANKLE FRACTURE AND SYNDESMOSIS;  Surgeon: Altamese , MD;  Location: [facility];  Service: Orthopedics;  Laterality: Left;     OB History    Gravida  1   Para  1   Term  1   Preterm  0   AB  0   Living  1     SAB  0   TAB  0   Ectopic  0   Multiple  0   Live Births  1           Family History  Problem Relation Age of Onset  . Birth defects Cousin        Down Syndrome  . Anesthesia problems Mother   . Anxiety disorder Mother   . Arthritis Mother   . Cancer Mother   . Depression Mother   . Diabetes Mother   . Early death Mother   . Asthma Father   . Hypertension Father   . Alcohol abuse Brother   . Drug abuse Brother   . Alcohol abuse Maternal Uncle   . Diabetes Maternal Uncle   .  Drug abuse Maternal Uncle   . Cancer Maternal Grandmother   . Stroke Maternal Grandmother   . Alcohol abuse Maternal Grandfather   . Cancer Maternal Grandfather   . Obesity Maternal Grandfather     Social History   Tobacco Use  . Smoking status: Never Smoker  . Smokeless tobacco: Never Used  Substance Use Topics  . Alcohol use: Yes    Alcohol/week: 1.0 standard drinks    Types: 1 Glasses of wine per week    Comment: 1 glass wine per day until known pregnancy at approx 12 weeks  . Drug use: No    Home Medications Prior to Admission medications   Medication Sig Start Date End Date Taking? Authorizing Provider  acetaminophen (TYLENOL) 650 MG CR tablet Take 650 mg by mouth every 8 (eight) hours as needed for pain.    [provider]  albuterol (VENTOLIN HFA) 108 (90 Base) MCG/ACT inhaler Inhale 2 puffs into the lungs every 4 (four) hours as needed for wheezing. 01/13/19   Nuala Alpha, DO  amitriptyline  (ELAVIL) 10 MG tablet Take 1 tablet (10 mg total) by mouth at bedtime. 08/01/19   Nuala Alpha, DO  budesonide-formoterol (SYMBICORT) 80-4.5 MCG/ACT inhaler Inhale 2 puffs into the lungs daily as needed. 08/23/19   Nuala Alpha, DO  citalopram (CELEXA) 20 MG tablet Take 1 tablet (20 mg total) by mouth daily. 07/19/19   Sherene Sires, DO  diclofenac (VOLTAREN) 75 MG EC tablet Take 1 tablet (75 mg total) by mouth 2 (two) times daily. Patient not taking: Reported on 04/08/2018 11/10/17   Fransico Meadow, PA-C  diphenhydramine-acetaminophen (TYLENOL PM EXTRA STRENGTH) 25-500 MG TABS tablet Take 2 tablets by mouth at bedtime as needed (sleep/pain).    [provider]  gabapentin (NEURONTIN) 400 MG capsule Take 1 capsule (400 mg total) by mouth 3 (three) times daily. 07/13/19   Nuala Alpha, DO  lisinopril-hydrochlorothiazide (ZESTORETIC) 20-12.5 MG tablet Take 1 tablet by mouth daily. 09/17/18   Nuala Alpha, DO  medroxyPROGESTERone (DEPO-PROVERA) 150 MG/ML injection Inject 1 mL (150 mg total) into the muscle every 3 (three) months. Patient not taking: Reported on 06/10/2019 05/27/18   Sloan Leiter, MD  ondansetron (ZOFRAN ODT) 4 MG disintegrating tablet Take 1 tablet (4 mg total) by mouth every 8 (eight) hours as needed for nausea or vomiting. Patient not taking: Reported on 06/10/2019 04/13/18   Marjie Skiff, MD  oxyCODONE (OXY IR/ROXICODONE) 5 MG immediate release tablet Take 1 tablet (5 mg total) by mouth every 6 (six) hours as needed for breakthrough pain (take between percocet for breakthrough pain only). Patient not taking: Reported on 04/08/2018 09/10/17   Ainsley Spinner, PA-C  oxyCODONE-acetaminophen (PERCOCET/ROXICET) 5-325 MG tablet Take 1-2 tablets by mouth every 8 (eight) hours as needed. Patient not taking: Reported on 04/08/2018 09/10/17   Ainsley Spinner, PA-C    Allergies    Pineapple, Tomato, and Hydrocodone  Review of Systems   Review of Systems All systems reviewed  and negative, other than as noted in HPI.  Physical Exam Updated Vital Signs BP (!) 127/95   Pulse (!) 110   Temp 98.4 F (36.9 C) (Oral)   Resp 16   Ht 4\' 9"  (1.448 m)   Wt 104.3 kg   SpO2 100%   BMI 49.77 kg/m   Physical Exam Vitals and nursing note reviewed.  Constitutional:      General: She is not in acute distress.    Appearance: She is well-developed.  HENT:  Head: Normocephalic and atraumatic.  Eyes:     General:        Right eye: No discharge.        Left eye: No discharge.     Conjunctiva/sclera: Conjunctivae normal.  Cardiovascular:     Rate and Rhythm: Normal rate and regular rhythm.     Heart sounds: Normal heart sounds. No murmur. No friction rub. No gallop.   Pulmonary:     Effort: Pulmonary effort is normal. No respiratory distress.     Breath sounds: Normal breath sounds.  Abdominal:     General: There is no distension.     Palpations: Abdomen is soft.     Tenderness: There is no abdominal tenderness.  Musculoskeletal:        General: No tenderness.     Cervical back: Neck supple.  Skin:    General: Skin is warm and dry.  Neurological:     Mental Status: She is alert and oriented to person, place, and time. Mental status is at baseline.  Psychiatric:        Behavior: Behavior normal.        Thought Content: Thought content normal.     ED Results / Procedures / Treatments   Labs (all labs ordered are listed, but only abnormal results are displayed) Labs Reviewed  BASIC METABOLIC PANEL - Abnormal; Notable for the following components:      Result Value   Glucose, Bld 112 (*)    Calcium 8.7 (*)    All other components within normal limits  CBC WITH DIFFERENTIAL/PLATELET  URINALYSIS, ROUTINE W REFLEX MICROSCOPIC    EKG EKG Interpretation  Date/Time:  Sunday September 04 2019 16:48:10 EST Ventricular Rate:  98 PR Interval:    QRS Duration: 82 QT Interval:  359 QTC Calculation: 459 R Axis:   34 Text Interpretation: Sinus rhythm ST  elev, probable normal early repol pattern Confirmed by Kirin Brandenburger ([id number]) on 09/04/2019 5:41:34 PM Also confirmed by Hutton Pellicane ([id number]), editor [place], Beverly ([id number])  on 09/05/2019 7:52:45 AM   Radiology No results found.  Procedures Procedures (including critical care time)  Medications Ordered in ED Medications - No data to display  ED Course  I have reviewed the triage vital signs and the nursing notes.  Pertinent labs & imaging results that were available during my care of the patient were reviewed by me and considered in my medical decision making (see chart for details).    MDM Rules/Calculators/A&P                      43  year old female with recent falls.  Unclear etiology.  Afebrile.  At her baseline neurologically.  Does not have a UTI. It has been determined that no acute conditions requiring further emergency intervention are present at this time. The patient has been advised of the diagnosis and plan. I reviewed any labs and imaging including any potential incidental findings. I have reviewed nursing notes and appropriate previous records. We have discussed signs and symptoms that warrant return to the ED and they are listed in the discharge instructions.      Final Clinical Impression(s) / ED Diagnoses Final diagnoses:  Multiple falls    Rx / DC Orders ED Discharge Orders    None       Virgel Manifold, MD 09/06/19 1009

## 2019-09-20 DIAGNOSIS — F331 Major depressive disorder, recurrent, moderate: Secondary | ICD-10-CM | POA: Diagnosis not present

## 2019-11-11 ENCOUNTER — Other Ambulatory Visit: Payer: Self-pay | Admitting: *Deleted

## 2019-11-11 DIAGNOSIS — J4541 Moderate persistent asthma with (acute) exacerbation: Secondary | ICD-10-CM

## 2019-11-11 MED ORDER — BUDESONIDE-FORMOTEROL FUMARATE 80-4.5 MCG/ACT IN AERO: 2.0000 | INHALATION_SPRAY | Freq: Every day | RESPIRATORY_TRACT | 3 refills | Status: DC | PRN

## 2019-11-14 ENCOUNTER — Telehealth: Payer: Self-pay

## 2019-11-14 NOTE — Telephone Encounter (Signed)
Fax received from pharmacy requesting a PA for Budesonide-Formoterol. Per fax received, looks like patients insurance will cover the following:  Dulera  Flovent HFA Formoterol Fum   Please advise.

## 2019-11-16 ENCOUNTER — Ambulatory Visit: Payer: Medicaid Other | Admitting: Family Medicine

## 2019-11-18 ENCOUNTER — Other Ambulatory Visit: Payer: Self-pay | Admitting: Family Medicine

## 2019-11-18 DIAGNOSIS — F331 Major depressive disorder, recurrent, moderate: Secondary | ICD-10-CM | POA: Diagnosis not present

## 2019-11-18 MED ORDER — BREO ELLIPTA 100-25 MCG/INH IN AEPB: 1.0000 | INHALATION_SPRAY | Freq: Every day | RESPIRATORY_TRACT | 2 refills | Status: DC

## 2019-11-24 DIAGNOSIS — Z23 Encounter for immunization: Secondary | ICD-10-CM | POA: Diagnosis not present

## 2019-11-29 ENCOUNTER — Other Ambulatory Visit: Payer: Self-pay

## 2019-11-29 ENCOUNTER — Ambulatory Visit (INDEPENDENT_AMBULATORY_CARE_PROVIDER_SITE_OTHER): Payer: Medicare Other | Admitting: Family Medicine

## 2019-11-29 VITALS — BP 122/80 | HR 118 | Ht <= 58 in | Wt 222.0 lb

## 2019-11-29 DIAGNOSIS — Z1231 Encounter for screening mammogram for malignant neoplasm of breast: Secondary | ICD-10-CM

## 2019-11-29 DIAGNOSIS — E2839 Other primary ovarian failure: Secondary | ICD-10-CM | POA: Diagnosis not present

## 2019-11-29 DIAGNOSIS — M797 Fibromyalgia: Secondary | ICD-10-CM | POA: Diagnosis not present

## 2019-11-29 DIAGNOSIS — Z0000 Encounter for general adult medical examination without abnormal findings: Secondary | ICD-10-CM | POA: Diagnosis not present

## 2019-11-29 MED ORDER — ALBUTEROL SULFATE HFA 108 (90 BASE) MCG/ACT IN AERS: 2.0000 | INHALATION_SPRAY | RESPIRATORY_TRACT | 1 refills | Status: DC | PRN

## 2019-11-29 MED ORDER — AMITRIPTYLINE HCL 25 MG PO TABS: 25.0000 mg | ORAL_TABLET | Freq: Every day | ORAL | 2 refills | Status: DC

## 2019-11-29 NOTE — Progress Notes (Signed)
    SUBJECTIVE:   CHIEF COMPLAINT / HPI:   Chronic Pain Patient returns today for "pain all over" with fatigue. She says her pain and soreness are consistent with her fibromyalgia and thinks she is having another flare. She states she is more sore in her legs and arms. She is using an OTC cream that tends to help. The pain is intermittent and does not interfere with her walking but she does use a cane.  She also reports of dealing with some recent sadness due to the fact that she is single and unmarried and a friend of her's recently got married and she would like to be in a relationship. She denies feeling depressed and has no thoughts of hurting herself or ending her life. No mood changes, eating changes, or lack of motivation to pursue hobbies.  PERTINENT  PMH / PSH: Fibromyalgia  OBJECTIVE:   BP 122/80   Pulse (!) 118   Ht 4\' 9"  (1.448 m)   Wt 222 lb (100.7 kg)   SpO2 95%   BMI 48.04 kg/m   Gen: Alert and Oriented x 3, NAD CV: RRR, no murmurs, normal S1, S2 split Resp: CTAB, no wheezing, rales, or rhonchi, comfortable work of breathing Skin: warm, dry, intact, no rashes  ASSESSMENT/PLAN:   Fibromyalgia I believe her pain today is due to a flare of fibromyalgia. She continues to have difficulty with exercise which is the best long term treatment.  - Increasing Amitriptyline to 25mg  daily at night time. - Cont Gabapentin 400mg  TID - cont to encourage her to exercise daily with emphasis on water exercise as this has the best evidence.     Nuala Alpha, [place]

## 2019-11-29 NOTE — Patient Instructions (Signed)
It was great to see you today! Thank you for letting me participate in your care!  Today, we discussed your recent pain flare that I think is due to your fibromyalgia. I have increased your amitriptyline so help. Please see me in one month to ensure you are still doing well.  I have ordered a mammogram for you today. Please get it at your earliest convenience.   I have also sent in a rescue inhaler for you. Please pick it up at the pharmacy.  Be well, Harolyn Rutherford, DO PGY-3, [facility]

## 2019-12-01 NOTE — Assessment & Plan Note (Signed)
I believe her pain today is due to a flare of fibromyalgia. She continues to have difficulty with exercise which is the best long term treatment.  - Increasing Amitriptyline to 25mg  daily at night time. - Cont Gabapentin 400mg  TID - cont to encourage her to exercise daily with emphasis on water exercise as this has the best evidence.

## 2019-12-12 ENCOUNTER — Other Ambulatory Visit: Payer: Self-pay | Admitting: Family Medicine

## 2019-12-13 ENCOUNTER — Other Ambulatory Visit: Payer: Self-pay | Admitting: Family Medicine

## 2019-12-13 ENCOUNTER — Ambulatory Visit: Payer: Medicaid Other

## 2019-12-16 DIAGNOSIS — Z23 Encounter for immunization: Secondary | ICD-10-CM | POA: Diagnosis not present

## 2019-12-21 ENCOUNTER — Other Ambulatory Visit: Payer: Self-pay | Admitting: Family Medicine

## 2020-01-03 ENCOUNTER — Ambulatory Visit: Payer: Medicaid Other

## 2020-01-09 ENCOUNTER — Ambulatory Visit (INDEPENDENT_AMBULATORY_CARE_PROVIDER_SITE_OTHER): Payer: Medicare Other | Admitting: Family Medicine

## 2020-01-09 ENCOUNTER — Encounter: Payer: Self-pay | Admitting: Family Medicine

## 2020-01-09 ENCOUNTER — Other Ambulatory Visit: Payer: Self-pay

## 2020-01-09 VITALS — BP 120/80 | HR 87 | Ht <= 58 in | Wt 221.8 lb

## 2020-01-09 DIAGNOSIS — M797 Fibromyalgia: Secondary | ICD-10-CM

## 2020-01-09 DIAGNOSIS — B372 Candidiasis of skin and nail: Secondary | ICD-10-CM | POA: Insufficient documentation

## 2020-01-09 MED ORDER — AMITRIPTYLINE HCL 25 MG PO TABS: 35.0000 mg | ORAL_TABLET | Freq: Every day | ORAL | 2 refills | Status: DC

## 2020-01-09 MED ORDER — NYSTATIN 100000 UNIT/GM EX POWD: 1.0000 "application " | Freq: Three times a day (TID) | CUTANEOUS | 0 refills | Status: DC

## 2020-01-09 NOTE — Assessment & Plan Note (Signed)
Continued Fibromyalgia flare. - Increased Amitriptyline from 25mg  to 37.5mg  daily at night time for 2 weeks - f/u in 2 weeks to reassess. If still having frequent flare ups consider increasing Amitriptyline to 50mg  daily at night time and/or increasing Gabapentin.

## 2020-01-09 NOTE — Patient Instructions (Signed)
It was great to see you today! Thank you for letting me participate in your care!  Today, we discussed your continued pain from suspected fibromyalgia. I have increased your amitriptyline from 25mg  to 35mg  daily at night time. Please take it as directed. If you do not have improvements in 2 weeks please return to the clinic.  Be well, Harolyn Rutherford, DO PGY-3, [facility]

## 2020-01-09 NOTE — Progress Notes (Signed)
Patient given PHQ2 and 9.   Provider aware.  .Renella Steig R Amahri Dengel, CMA  

## 2020-01-09 NOTE — Assessment & Plan Note (Signed)
Common presentation given location and symptoms and it being associated with increased temperature and sweating. - Nystatin powder TID to affected area for 2 weeks.

## 2020-01-09 NOTE — Progress Notes (Addendum)
    SUBJECTIVE:   CHIEF COMPLAINT / HPI:   Fibromyalgia Patient presents for follow up for suspected fibromyalgia intermittent flare ups. At last appointment increased her amitriptyline at 25mg  daily at night time. She states that has helped a lot. However, she still has intermittent flare ups. Mostly, in her upper arms she still has days (every 3-4 days) where they are sore and hurt. No rashes, weakness, loss of sensation, no loss of range of motion. She is still able to do ADLs but just says every 3-4 days it will hurt and be sore and it is described as the same type of achy pain her fibromyalgia pain has had before. Rest and medication seem to help. She is complaint with her amitriptyline and her gabapentin.  Rash Dark, itchy rash under both breasts. Comes and goes and has gotten worse recently with the hot weather and increased sweating.   PERTINENT  PMH / PSH: HTH, Asthma, Fibromyalgia  OBJECTIVE:   BP 120/80   Pulse 87   Ht 4\' 9"  (1.448 m)   Wt 221 lb 12.8 oz (100.6 kg)   SpO2 98%   BMI 48.00 kg/m   Gen: NAD MSK: Upper Extremities Bilaterally: No ecchymosis or obvious deformity. TTP at shoulders, elbows, wrists, and hands. Full ROM in both UE, 5/5 strength, gross sensation intact. No rashes.  ASSESSMENT/PLAN:   Fibromyalgia Continued Fibromyalgia flare. - Increased Amitriptyline from 25mg  to 37.5mg  daily at night time for 2 weeks - f/u in 2 weeks to reassess. If still having frequent flare ups consider increasing Amitriptyline to 50mg  daily at night time and/or increasing Gabapentin.  Yeast dermatitis Common presentation given location and symptoms and it being associated with increased temperature and sweating. - Nystatin powder TID to affected area for 2 weeks.     Nuala Alpha, [place]

## 2020-01-10 ENCOUNTER — Telehealth: Payer: Self-pay

## 2020-01-10 NOTE — Telephone Encounter (Signed)
Patient calls nurse line wanting clarification on Amitriptyline directions. Patient reports she has one bottle of 10mg  and now one bottle of 25mg  from yesterday. Patient states she believes he told me to take 35mg  total. Assuming she would take one 10mg  and one 25mg . Patient is confused. Please advise on how she should be taking.

## 2020-01-11 ENCOUNTER — Encounter: Payer: Self-pay | Admitting: Family Medicine

## 2020-01-25 ENCOUNTER — Other Ambulatory Visit: Payer: Self-pay | Admitting: Family Medicine

## 2020-01-26 ENCOUNTER — Other Ambulatory Visit: Payer: Self-pay

## 2020-01-26 ENCOUNTER — Encounter: Payer: Self-pay | Admitting: Family Medicine

## 2020-01-26 ENCOUNTER — Ambulatory Visit (INDEPENDENT_AMBULATORY_CARE_PROVIDER_SITE_OTHER): Payer: Medicare Other | Admitting: Family Medicine

## 2020-01-26 DIAGNOSIS — J4540 Moderate persistent asthma, uncomplicated: Secondary | ICD-10-CM | POA: Diagnosis not present

## 2020-01-26 DIAGNOSIS — M797 Fibromyalgia: Secondary | ICD-10-CM | POA: Diagnosis not present

## 2020-01-26 DIAGNOSIS — F32A Depression, unspecified: Secondary | ICD-10-CM

## 2020-01-26 DIAGNOSIS — F329 Major depressive disorder, single episode, unspecified: Secondary | ICD-10-CM | POA: Diagnosis not present

## 2020-01-26 MED ORDER — DULOXETINE HCL 30 MG PO CPEP: 30.0000 mg | ORAL_CAPSULE | Freq: Every day | ORAL | 0 refills | Status: DC

## 2020-01-26 MED ORDER — BREO ELLIPTA 200-25 MCG/INH IN AEPB: 1.0000 | INHALATION_SPRAY | Freq: Every day | RESPIRATORY_TRACT | 3 refills | Status: DC

## 2020-01-26 MED ORDER — AMITRIPTYLINE HCL 10 MG PO TABS: 10.0000 mg | ORAL_TABLET | Freq: Every day | ORAL | 2 refills | Status: DC

## 2020-01-26 NOTE — Assessment & Plan Note (Addendum)
Improved from prior visit after increasing Amitriptyline from 25mg  to 35mg  daily.  Still with bilateral arm and leg pain, worse in the mornings, but feels her medications help. Discussed importance of exercise. Plan:  -Continue Amitriptyline 35mg  daily and Gabapentin 800mg  BID. -Can consider increasing to Amitriptyline 50mg  in the future as needed.

## 2020-01-26 NOTE — Assessment & Plan Note (Signed)
Not well controlled- patient using Albuterol inhaler multiple times per day. Plan: -increased Breo-Ellipta from 100-25 mcg/inh to 200-25 -continue Albuterol inhaler as needed (refills provided today) -will address in more depth at next appointment in 3 weeks

## 2020-01-26 NOTE — Telephone Encounter (Signed)
Patient calls nurse line requesting a refill on Amitriptyline 10mg  tablets. Please advise.

## 2020-01-26 NOTE — Patient Instructions (Signed)
It was great to meet you!  Our plans for today:  - Stop taking the Celexa. Start taking Cymbalta 30mg  once daily - We increased your Breo-Ellipta dose. Please pick up new inhaler at pharmacy. - Call [facility] for mental health support - Follow up in 2-3 weeks  Take care and seek immediate care sooner if you develop any concerns.   Dr. [facility]

## 2020-01-26 NOTE — Assessment & Plan Note (Signed)
Worsened from prior. Patient reports increased sadness recently, PHQ-9 score 13 today. No SI or self-harm. Plan: -Switch from Citalopram 20mg  daily to Duloxetine 30mg  daily -Follow up in 3 weeks for monitoring -Continue therapy at [facility] for increased mental health support as needed

## 2020-01-26 NOTE — Progress Notes (Signed)
    SUBJECTIVE:   CHIEF COMPLAINT / HPI:   1. Fibromyalgia Patient reports pain in bilateral upper and lower extremities, which has been present for years. Described as "soreness" that is worse in the mornings and improved at night, but tolerable overall. Feels her medications help significantly. Recently increased amitriptyline from 25mg  daily to 35mg  daily with good response and no adverse effects.  2. Depression Patient endorses increased sadness. Feels like she could cry very easily. Reports multiple stressors (single parent, disabled). Denies SI or self harm. Has a history of depression that has been well controlled on Citalopram 20mg  daily for several years. She sees a therapist Garfield Cornea) at [facility] 1x/month which she finds helpful. Patient is interested in seeking further therapy at [facility].  3. Asthma Patient states she is using her Ventolin inhaler multiple times per day. Will discuss in further detail at next appointment. No significant SOB or wheezing currently.   PERTINENT  PMH / PSH: fibromyalgia, asthma, depression, HTN, cerebral palsy  OBJECTIVE:   BP 124/84   Pulse 98   Ht 4\' 9"  (1.448 m)   Wt 224 lb (101.6 kg)   LMP 01/20/2020 (Exact Date)   SpO2 98%   BMI 48.47 kg/m    General: alert, well-appearing, NAD Lungs: Normal work of breathing, breath sounds equal bilaterally, lungs CTA Heart: RRR, normal S1/S2, no murmurs MSK: 5/5 strength throughout, gross sensation intact, full ROM in bilateral upper extremities   ASSESSMENT/PLAN:   Fibromyalgia Improved from prior visit after increasing Amitriptyline from 25mg  to 35mg  daily.  Still with bilateral arm and leg pain, worse in the mornings, but feels her medications help. Discussed importance of exercise. Plan:  -Continue Amitriptyline 35mg  daily and Gabapentin 800mg  BID. -Can consider increasing to Amitriptyline 50mg  in the future as needed.  Asthma Not well controlled- patient using Albuterol inhaler  multiple times per day. Plan: -increased Breo-Ellipta from 100-25 mcg/inh to 200-25 -continue Albuterol inhaler as needed (refills provided today) -will address in more depth at next appointment in 3 weeks  Depression Worsened from prior. Patient reports increased sadness recently, PHQ-9 score 13 today. No SI or self-harm. Plan: -Switch from Citalopram 20mg  daily to Duloxetine 30mg  daily -Follow up in 3 weeks for monitoring -Continue therapy at [facility] for increased mental health support as needed     Alcus Dad, MD [place]

## 2020-02-08 ENCOUNTER — Other Ambulatory Visit: Payer: Self-pay | Admitting: Family Medicine

## 2020-02-08 ENCOUNTER — Ambulatory Visit
Admission: RE | Admit: 2020-02-08 | Discharge: 2020-02-08 | Disposition: A | Discharge status: Home / Self Care | Payer: Medicare Other | Source: Ambulatory Visit | Attending: Family Medicine | Admitting: Family Medicine

## 2020-02-08 ENCOUNTER — Other Ambulatory Visit: Payer: Self-pay

## 2020-02-08 DIAGNOSIS — Z1231 Encounter for screening mammogram for malignant neoplasm of breast: Secondary | ICD-10-CM

## 2020-02-09 ENCOUNTER — Other Ambulatory Visit: Payer: Self-pay

## 2020-02-09 ENCOUNTER — Ambulatory Visit (INDEPENDENT_AMBULATORY_CARE_PROVIDER_SITE_OTHER): Payer: Medicare Other | Admitting: Family Medicine

## 2020-02-09 ENCOUNTER — Encounter: Payer: Self-pay | Admitting: Family Medicine

## 2020-02-09 VITALS — BP 124/84 | HR 103 | Wt 225.6 lb

## 2020-02-09 DIAGNOSIS — F329 Major depressive disorder, single episode, unspecified: Secondary | ICD-10-CM

## 2020-02-09 DIAGNOSIS — R0600 Dyspnea, unspecified: Secondary | ICD-10-CM | POA: Diagnosis not present

## 2020-02-09 DIAGNOSIS — F32A Depression, unspecified: Secondary | ICD-10-CM

## 2020-02-09 HISTORY — DX: Dyspnea, unspecified: R06.00

## 2020-02-09 MED ORDER — HYDROCORTISONE 2.5 % EX CREA: TOPICAL_CREAM | Freq: Two times a day (BID) | CUTANEOUS | 0 refills | Status: DC

## 2020-02-09 NOTE — Assessment & Plan Note (Addendum)
Differential includes CHF, sleep apnea, asthma, anxiety.  Plan: -Echocardiogram scheduled for tomorrow -Will obtain sleep study pending echo results

## 2020-02-09 NOTE — Progress Notes (Signed)
    SUBJECTIVE:   CHIEF COMPLAINT / HPI:   Depression Patient presents for follow-up after adjusting her depression medication. At her last visit 15 days ago, she was switched from Celexa 20 mg daily to Cymbalta 30 mg daily due to increased sadness and ongoing fibromyalgia pain. Patient reports a few days of GI upset initially, but is now tolerating the medicine well.  She denies ongoing GI symptoms, somnolence, or other adverse effects.  She notes 2 episodes of unprovoked crying, but feels the medication is helping overall. Her PHQ-2 screen is improved from last visit, and she denies SI or thoughts of self-harm.  Orthopnea, PND Patient endorses dyspnea when laying flat and states she sleeps propped up on multiple pillows. She also reports waking up suddenly in the night feeling short of breath. Denies significant dyspnea on exertion or leg swelling. No known hx of CHF.  Cough Patient states she has frequent coughing episodes. This has been going on for quite some time. The cough is nonproductive. In the past she had been using her albuterol inhaler multiple times per day, but since increasing her Breo-Ellipta dose, she has only needed her albuterol occasionally at night. Patient thinks her lisinopril may be the cause of her cough.  Rash Patient notes an ongoing rash in her inframammary region.  She has been applying nystatin with minimal relief. Thought to be intertrigo in the past. Will defer to next visit.  PERTINENT  PMH / PSH: Fibromyalgia, asthma, depression, hypertension, cerebral palsy  OBJECTIVE:   BP 124/84   Pulse (!) 103   Wt 225 lb 9.6 oz (102.3 kg)   LMP 01/20/2020 (Exact Date)   SpO2 97%   BMI 48.82 kg/m   General: Alert, well-appearing, NAD Cardiac: RRR, normal S1/S2, no murmurs, rubs or gallops Lungs: Normal work of breathing, breath sounds equal bilaterally, lungs CTA without wheezes or rales Neck: Unable to evaluate JVP secondary to body habitus Extremities: No  lower extremity edema Mental status: Pleasant, appropriate affect, well groomed.  Normal thought content and speech.   ASSESSMENT/PLAN:   Depression Moderately well controlled with Cymbalta 30mg  daily. Denies SI and PHQ improved from prior but still with some tearfulness. Plan: -Continue Cymbalta 30mg  daily -Return in 3 weeks for further monitoring. Can consider increasing dose at that time as appropriate -Continue therapy at [place] as needed at [facility]  Paroxysmal nocturnal dyspnea Differential includes CHF, sleep apnea, asthma, anxiety.  Plan: -Echocardiogram scheduled for tomorrow -Will obtain sleep study pending echo results   Cough Likely multifactorial in nature-- suspect Lisinopril-HCTZ may be contributing factor as well as underlying asthma. Plan: -At next visit plan to switch from Lisinopril-HCTZ 20-12.5mg  daily to Hyzaar 50-12.5mg  daily   Inframammary Rash Defer to next visit. Recommended mixing Nystatin with Hydrocortisone 2.5% in the meantime.   Discussed case with Dr. Owens Shark.    Alcus Dad, MD [place]

## 2020-02-09 NOTE — Assessment & Plan Note (Signed)
Moderately well controlled with Cymbalta 30mg  daily. Denies SI and PHQ improved from prior but still with some tearfulness. Plan: -Continue Cymbalta 30mg  daily -Return in 3 weeks for further monitoring. Can consider increasing dose at that time as appropriate -Continue therapy at [place] as needed at [facility]

## 2020-02-09 NOTE — Patient Instructions (Addendum)
It was great to see you!  Please bring ALL your medications (including over the counter) to your next appointment!  Things we discussed today: For your depression,  - Continue taking Cymbalta 30mg  daily - Please schedule a follow up appointment with me in 3 weeks  For your cough and shortness of breath, - I have ordered an echocardiogram to look at your heart - I will call you with the results - At your next visit, we will switch your lisinopril-HCTZ to a new medication called Hyzaar - We will consider doing a sleep study in the near future  For your rash:  - I have ordered hydrocortisone cream. Mix this with the Nystatin and apply twice daily - We will discuss at your next visit  Take care and seek immediate care sooner if you develop any concerns.   Dr. [facility]

## 2020-02-10 ENCOUNTER — Ambulatory Visit (HOSPITAL_COMMUNITY): Payer: Medicare Other

## 2020-02-16 ENCOUNTER — Ambulatory Visit (HOSPITAL_COMMUNITY): Admission: RE | Admit: 2020-02-16 | Payer: Medicaid Other | Source: Ambulatory Visit

## 2020-02-17 ENCOUNTER — Other Ambulatory Visit: Payer: Self-pay | Admitting: Family Medicine

## 2020-02-22 ENCOUNTER — Ambulatory Visit (HOSPITAL_COMMUNITY)
Admission: RE | Admit: 2020-02-22 | Discharge: 2020-02-22 | Disposition: A | Discharge status: Home / Self Care | Payer: Medicare Other | Source: Ambulatory Visit | Attending: Family Medicine | Admitting: Family Medicine

## 2020-02-22 ENCOUNTER — Other Ambulatory Visit: Payer: Self-pay

## 2020-02-22 DIAGNOSIS — R0609 Other forms of dyspnea: Secondary | ICD-10-CM | POA: Insufficient documentation

## 2020-02-22 DIAGNOSIS — I10 Essential (primary) hypertension: Secondary | ICD-10-CM | POA: Diagnosis not present

## 2020-02-22 DIAGNOSIS — R0600 Dyspnea, unspecified: Secondary | ICD-10-CM | POA: Diagnosis not present

## 2020-02-22 LAB — ECHOCARDIOGRAM COMPLETE
Area-P 1/2: 5.46 cm2
S' Lateral: 1.9 cm

## 2020-02-22 NOTE — Progress Notes (Signed)
Echocardiogram 2D Echocardiogram has been performed.  Oneal Deputy Margaux Engen 02/22/2020, 3:53 PM

## 2020-02-29 ENCOUNTER — Ambulatory Visit: Payer: Medicaid Other | Admitting: Family Medicine

## 2020-03-20 ENCOUNTER — Other Ambulatory Visit: Payer: Self-pay

## 2020-03-22 MED ORDER — NYSTATIN 100000 UNIT/GM EX POWD: 1.0000 "application " | Freq: Three times a day (TID) | CUTANEOUS | 0 refills | Status: DC

## 2020-03-30 ENCOUNTER — Encounter: Payer: Self-pay | Admitting: Family Medicine

## 2020-03-30 ENCOUNTER — Ambulatory Visit (INDEPENDENT_AMBULATORY_CARE_PROVIDER_SITE_OTHER): Payer: Medicare Other | Admitting: Family Medicine

## 2020-03-30 ENCOUNTER — Other Ambulatory Visit: Payer: Self-pay

## 2020-03-30 VITALS — BP 122/78 | HR 123 | Ht <= 58 in | Wt 225.0 lb

## 2020-03-30 DIAGNOSIS — F329 Major depressive disorder, single episode, unspecified: Secondary | ICD-10-CM

## 2020-03-30 DIAGNOSIS — F32A Depression, unspecified: Secondary | ICD-10-CM

## 2020-03-30 DIAGNOSIS — I10 Essential (primary) hypertension: Secondary | ICD-10-CM | POA: Diagnosis not present

## 2020-03-30 MED ORDER — AMLODIPINE BESYLATE 5 MG PO TABS: 5.0000 mg | ORAL_TABLET | Freq: Every day | ORAL | 3 refills | Status: DC

## 2020-03-30 NOTE — Assessment & Plan Note (Signed)
Moderately well controlled with Cymbalta 30mg  daily. PHQ-9 score of 10 today. Denies SI. -Will increase Cymbalta to 60mg  daily -Continue therapy at [facility]

## 2020-03-30 NOTE — Assessment & Plan Note (Addendum)
Stable, well-controlled. Patient's BP is 122/78 today. Has been at goal with systolic BPs in the 753M at last several visits. Will switch from Lisinopril-HCTZ combo pill to amlodipine 5mg  daily, as Lisinopril may be contributing to patient's chronic cough.

## 2020-03-30 NOTE — Progress Notes (Signed)
    SUBJECTIVE:   CHIEF COMPLAINT / HPI:   Rash Patient reports rash under bilateral breasts. Present x2 months, but fluctuates in severity. No prior hx of similar rash. Has been using Nystatin powder and hydrocort ~3x/week for the past month. Patient feels it's improving overall, perhaps about 20% better than before. The rash is intermittently itchy and painful. No rash elsewhere on her body.   Depression follow up Patient started Cymbalta 30mg  in July (had previously been on Citalopram 20mg  for several years). Reports some improvement with Cymbalta-- states she has not been tearful/crying at all since her last visit 1 month ago and that she had enough energy to throw her daughter a birthday party. Patient is tolerating the medication well without adverse effects.  PERTINENT  PMH / PSH: depression, fibromyalgia, HTN, asthma, cerebral palsy  OBJECTIVE:   BP 122/78   Pulse (!) 123   Ht 4\' 9"  (1.448 m)   Wt 225 lb (102.1 kg)   LMP 03/14/2020   SpO2 99%   BMI 48.69 kg/m   Gen: Alert, well-appearing Heart: RRR, normal S1/S2 without m/r/g Lungs: CTAB, normal work of breathing Abd: Obese, soft, nontender Skin: Erythema of inframammary creases L>R, no other rashes noted  ASSESSMENT/PLAN:   Candidal Intertrigo Advised patient to use Nystatin powder daily and to keep the area as dry as possible. Instructed patient to only use hydrocort when she has significant itching. Suspect this will improve in the colder months, but patient may have some residual hyperpigmentation.    Depression Moderately well controlled with Cymbalta 30mg  daily. PHQ-9 score of 10 today. Denies SI. -Will increase Cymbalta to 60mg  daily -Continue therapy at [facility]   Hypertension Stable, well-controlled. Patient's BP is 122/78 today. Has been at goal with systolic BPs in the 185T at last several visits. Will switch from Lisinopril-HCTZ combo pill to amlodipine 5mg  daily, as Lisinopril may be contributing to  patient's chronic cough.   Alcus Dad, MD [place]

## 2020-03-30 NOTE — Patient Instructions (Addendum)
It was great to see you!  Our plans for today:  - Continue using the Nystatin powder under your breasts. Rather than use it intermittently, you can use it twice daily. Keep the area as dry as possible. - Only use the hydrocort cream when you have significant itching.  - We are increasing the dose of your Cymbalta from 30mg  to 60mg . Please take two 30mg  pills daily. Call me if you need refills.  - We are switching your blood pressure medication. STOP taking the Lisinopril-Hydrochlorothiazide (Zestoretic) and START taking amlodipine 5mg  daily.   Take care and seek immediate care sooner if you develop any concerns.  Dr. [facility]

## 2020-04-03 ENCOUNTER — Telehealth: Payer: Self-pay | Admitting: Family Medicine

## 2020-04-03 NOTE — Telephone Encounter (Signed)
Patient calls nurse line regarding sick symptoms. Patient reports having congestion, headache, ear pain, runny nose, and slight cough. Feels like "she is under water".   Patient is requesting refill on ventolin inhaler and further recommendations on treatment of symptoms. Advised using flonase/ nasal spray for nasal congestion. Patient reports using Nyquil with little relief of symptoms. Also advised the use of humidifier, warm fluids and honey for sore throat and cough. Please advise additional recommendations.   *Patient is requesting refill on Flonase. Patient has used medication in the past, however, it is no longer on med list.   ED/ return precautions given.   Talbot Grumbling, RN

## 2020-04-05 NOTE — Telephone Encounter (Signed)
Patient returns call to nurse line regarding refills. Patient still reports cough and sore throat. Patient states that she has been using OTC flonase and mucinex. Patient reports productive cough with clear mucus.   To PCP  Talbot Grumbling, RN

## 2020-04-05 NOTE — Telephone Encounter (Signed)
Patient LVM on nurse line to check status of albuterol and flonase refills.   To PCP  Talbot Grumbling, RN

## 2020-04-11 ENCOUNTER — Ambulatory Visit: Payer: Medicaid Other | Admitting: Obstetrics and Gynecology

## 2020-04-14 ENCOUNTER — Other Ambulatory Visit: Payer: Self-pay | Admitting: Family Medicine

## 2020-04-26 ENCOUNTER — Other Ambulatory Visit: Payer: Self-pay | Admitting: Family Medicine

## 2020-04-26 ENCOUNTER — Other Ambulatory Visit: Payer: Self-pay

## 2020-04-26 MED ORDER — DULOXETINE HCL 30 MG PO CPEP: 60.0000 mg | ORAL_CAPSULE | Freq: Every day | ORAL | 3 refills | Status: DC

## 2020-04-26 NOTE — Telephone Encounter (Signed)
Patient calling nurse line regarding Cymbalta medication refill. Patient states that medication was supposed to be increased to 60 mg, however, refill was only for 30 mg. Patient is now out of medication and is requesting additional refill with correct dosage.   To PCP  Talbot Grumbling, RN

## 2020-04-29 NOTE — Progress Notes (Signed)
    SUBJECTIVE:   CHIEF COMPLAINT / HPI:   HTN follow-up Patient was switched from lisinopril-HCTZ to amlodipine 5mg  at her last visit one month ago. Patient reports her cough is much improved from prior. Tolerating the amlodipine without any adverse effects. Excellent compliance.   Depression follow-up Patient's Cymbalta was increased from 30mg  to 60mg  at last visit. She reports a significant improvement in her depressive symptoms. States her overall mood is improved and she has noticeably more energy. Denies adverse effects.   HM: would like to receive flu shot today  PERTINENT  PMH / PSH: depression, fibromyalgia, HTN, asthma, cerebral palsy   OBJECTIVE:   BP 128/78   Pulse 100   Wt 230 lb (104.3 kg)   LMP 04/11/2020   SpO2 98%   BMI 49.77 kg/m   Gen: alert, non-toxic appearing, obese Cardiac: RRR, normal S1/S2, no murmurs, rubs or gallops Lungs: Lungs CTA without wheezes or rales Abd: obese, nontender Ext: no peripheral edema  ASSESSMENT/PLAN:   Depression Improved from prior. Moderately well-controlled with PHQ-9 score of 7 today (improved from 10 at last visit). No SI or self-harm.  -Continue Cymbalta 60mg  daily  Hypertension Well controlled. BP 128/78 today. -Continue Amlodipine 5mg  daily -Discussed importance of low sodium diet  Tachycardia Patient has been tachycardic to the low 100s at majority of her previous visits dating back at least 2 years. HR 100 today. I suspect this is related to severe deconditioning, as patient has difficulty walking even short distances. Patient denies chest pain or palpitations. Prior TSH levels wnl. EKG in February 2021 was unremarkable and echo in July 2021 showed hyperdynamic LV with EF 70-75% and grade 1 diastolic dysfunction. -Counseled extensively on importance of diet and exercise to achieve a healthier BMI and improved aerobic conditioning. -Will continue to monitor closely at future appointments  Return in 2-3  months.  Discussed case with Dr. Franky Macho, [place]

## 2020-04-30 ENCOUNTER — Ambulatory Visit (INDEPENDENT_AMBULATORY_CARE_PROVIDER_SITE_OTHER): Payer: Medicare Other | Admitting: Family Medicine

## 2020-04-30 ENCOUNTER — Other Ambulatory Visit: Payer: Self-pay

## 2020-04-30 ENCOUNTER — Encounter: Payer: Self-pay | Admitting: Family Medicine

## 2020-04-30 VITALS — BP 128/78 | HR 100 | Wt 230.0 lb

## 2020-04-30 DIAGNOSIS — Z23 Encounter for immunization: Secondary | ICD-10-CM | POA: Diagnosis not present

## 2020-04-30 DIAGNOSIS — F32A Depression, unspecified: Secondary | ICD-10-CM

## 2020-04-30 DIAGNOSIS — I10 Essential (primary) hypertension: Secondary | ICD-10-CM

## 2020-04-30 NOTE — Assessment & Plan Note (Signed)
Well controlled. BP 128/78 today. -Continue Amlodipine 5mg  daily -Discussed importance of low sodium diet

## 2020-04-30 NOTE — Patient Instructions (Signed)
It was great to see you, as always! I'm so glad you're doing well.  Things we discussed today: -Continue the Cymbalta 60mg  daily -Continue the Amlodipine 5mg  at bedtime  -The single most important thing you can do for your health is exercise and watching your diet. Please try to exercise for 10 minutes 3x/week. We will work up from there :)   Take care and seek immediate care sooner if you develop any concerns.   Dr. [facility]

## 2020-04-30 NOTE — Assessment & Plan Note (Signed)
Improved from prior. Moderately well-controlled with PHQ-9 score of 7 today (improved from 10 at last visit). No SI or self-harm.  -Continue Cymbalta 60mg  daily

## 2020-05-19 ENCOUNTER — Other Ambulatory Visit: Payer: Self-pay | Admitting: Family Medicine

## 2020-05-20 ENCOUNTER — Other Ambulatory Visit: Payer: Self-pay | Admitting: Family Medicine

## 2020-05-21 ENCOUNTER — Ambulatory Visit: Payer: Medicaid Other | Admitting: Obstetrics and Gynecology

## 2020-08-01 ENCOUNTER — Other Ambulatory Visit: Payer: Self-pay | Admitting: Family Medicine

## 2020-08-03 ENCOUNTER — Other Ambulatory Visit: Payer: Self-pay | Admitting: Family Medicine

## 2020-08-03 ENCOUNTER — Ambulatory Visit (INDEPENDENT_AMBULATORY_CARE_PROVIDER_SITE_OTHER): Payer: Medicare Other | Admitting: Family Medicine

## 2020-08-03 ENCOUNTER — Other Ambulatory Visit: Payer: Self-pay

## 2020-08-03 VITALS — BP 132/80 | HR 110 | Ht <= 58 in | Wt 232.2 lb

## 2020-08-03 DIAGNOSIS — I10 Essential (primary) hypertension: Secondary | ICD-10-CM

## 2020-08-03 DIAGNOSIS — L989 Disorder of the skin and subcutaneous tissue, unspecified: Secondary | ICD-10-CM | POA: Insufficient documentation

## 2020-08-03 DIAGNOSIS — M791 Myalgia, unspecified site: Secondary | ICD-10-CM

## 2020-08-03 DIAGNOSIS — R7989 Other specified abnormal findings of blood chemistry: Secondary | ICD-10-CM | POA: Diagnosis not present

## 2020-08-03 DIAGNOSIS — M797 Fibromyalgia: Secondary | ICD-10-CM | POA: Diagnosis not present

## 2020-08-03 DIAGNOSIS — R5383 Other fatigue: Secondary | ICD-10-CM | POA: Diagnosis not present

## 2020-08-03 DIAGNOSIS — W19XXXA Unspecified fall, initial encounter: Secondary | ICD-10-CM

## 2020-08-03 HISTORY — DX: Disorder of the skin and subcutaneous tissue, unspecified: L98.9

## 2020-08-03 HISTORY — DX: Unspecified fall, initial encounter: W19.XXXA

## 2020-08-03 MED ORDER — AMITRIPTYLINE HCL 50 MG PO TABS: 50.0000 mg | ORAL_TABLET | Freq: Every day | ORAL | 3 refills | Status: DC

## 2020-08-03 MED ORDER — GABAPENTIN 400 MG PO CAPS: 400.0000 mg | ORAL_CAPSULE | Freq: Three times a day (TID) | ORAL | 3 refills | Status: DC

## 2020-08-03 MED ORDER — DICLOFENAC SODIUM 1 % EX GEL: 2.0000 g | Freq: Two times a day (BID) | CUTANEOUS | 3 refills | Status: DC

## 2020-08-03 NOTE — Patient Instructions (Signed)
It was wonderful to meet you today. Thank you for allowing me to be a part of your care. Below is a short summary of what we discussed at your visit today:  Increased pain -Increase amitriptyline to 50 mg daily at bedtime -Apply Voltaren gel 2-3 times daily for at least 2 weeks -Make a therapist appointment with your established therapist in [place], [name] [name] -Follow-up as needed  Eczema and dark skin spots Today we tested your vitamin D level.  If the results are normal, I will send you letter.  If results are abnormal, I will give you a call with your results and a treatment plan.  Blood pressure Your blood pressure is doing well with the amlodipine 5 mg daily.  Keep measuring this at home.  Keep striving towards a low-sodium diet.   If you have any questions or concerns, please do not hesitate to contact us via phone or MyChart message.   Ezequiel Essex, MD

## 2020-08-03 NOTE — Assessment & Plan Note (Deleted)
Reports fall on 08/01/2020, leaving her right side bruised and sore. Denies LOC, head injury, blood loss. No gait abnormality or gross abnormality.

## 2020-08-03 NOTE — Assessment & Plan Note (Signed)
Patient reports eczematous rash on left forearm and hyperpigmented spots across chest, previously discussed with her as possible vitamin D deficiency. Will order vitamin D level today and treat as indicated.

## 2020-08-03 NOTE — Assessment & Plan Note (Signed)
Increase in pain during winter months. 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  - voltaren gel TID to affected areas - increased amitriptyline from 30 mg to 50 mg daily - encourage follow up with therapist Garfield Cornea

## 2020-08-03 NOTE — Progress Notes (Signed)
SUBJECTIVE:   CHIEF COMPLAINT / HPI:   Increased fibromyalgia pain [name] [name] reports increased fibromyalgia pain for the last couple of months. Bilateral upper arms and also bilateral anterior thighs. Arms more painful than thighs. Arms and legs feel "weighed down and painful". Cannot describe exactly what this pain feels like besides "aching and hurting". Also has difficulty standing for long periods of time because of knee pain, but says this is at her baseline. No increased weakness, numbness, or tingling. Previously responding well to cymbalta and amitriptyline, recently increased to 60 mg daily and 35 mg daily, respectively, by [name] [name] on 03/30/2020. Denies adverse side effects of increased nausea, vomiting, constipation, diarrhea. Has established therapist in [place], [name] [name], but has not been to a therapy session since May. Reports doing well with this therapist and wanting to continue with her.   Recent fall Reports fall on 08/01/2020, leaving her right side bruised and sore. Said she fell and couldn't get up or reach her phone. Denies LOC, head injury, blood loss. No subsequent fracture, gait abnormality, or gross abnormality. Her ex-partner tried to reach her by phone unsuccessfully several times and thus called the fire department, whom then entered her home and assisted her up. She denies injury and declined medical evaluation at the emergency department.   Eczema rash and dark spots on skin Reports increased eczema on left forearm and increase in dark spots across her chest. Says that previously Dr. Erin [name] evaluated these spots and told her they looked like skin changes from vitamin D deficiency. She reports never having her vitamin D levels tested, is interested in that today.   Blood pressure Patient reports doing well on the amlodipine 5 mg. Recently taken off lisinopril by [name] [name] for chronic cough. Reports cough has improved and is pleased with her progress. Denies  symptoms of hypotension such as orthostatic dizziness.   PERTINENT  PMH / PSH: HTN, asthma, IBS, cerebral palsy, fibromyalgia, paroxysmal nocturnal dyspnea  OBJECTIVE:   BP 132/80   Pulse (!) 110   Ht 4\' 9"  (1.448 m)   Wt 232 lb 3.2 oz (105.3 kg)   SpO2 99%   BMI 50.25 kg/m    PHQ-9:  Depression screen [facility] 2/9 08/03/2020 04/30/2020 03/30/2020  Decreased Interest 2 0 1  Down, Depressed, Hopeless 2 0 2  PHQ - 2 Score 4 0 3  Altered sleeping 0 2 2  Tired, decreased energy 2 1 3   Change in appetite 0 1 0  Feeling bad or failure about yourself  1 1 2   Trouble concentrating 1 2 0  Moving slowly or fidgety/restless - 0 0  Suicidal thoughts - 0 0  PHQ-9 Score 8 7 10   Difficult doing work/chores - - -  Some recent data might be hidden     GAD-7: No flowsheet data found.    Physical Exam General: Awake, alert, oriented Cardiovascular: Regular rate and rhythm, S1 and S2 present, no murmurs auscultated Respiratory: Lung fields clear to auscultation bilaterally Extremities: No bilateral lower extremity edema, palpable pedal and pretibial pulses bilaterally, TTP of bilateral upper arms with moderate pressure Neuro: Cranial nerves II through X grossly intact, able to move all extremities spontaneously   ASSESSMENT/PLAN:   Fall Reports fall on 08/01/2020, leaving her right side bruised and sore. Denies LOC, head injury, blood loss. No gait abnormality or gross abnormality. Previously encouraged by [name] [name] to improve diet, exercise, and aerobic conditioning. Will send referral to PT today.   Hypertension  Doing well on 5 mg amlodipine. BP today 132/80. BP similar at home with personal BP cuff. No changes to management.   Skin lesion Patient reports eczematous rash on left forearm and hyperpigmented spots across chest, previously discussed with her as possible vitamin D deficiency. Will order vitamin D level today and treat as indicated.   Fibromyalgia Increase in pain during winter  months. 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  - voltaren gel TID to affected areas - increased amitriptyline from 30 mg to 50 mg daily - encourage follow up with therapist [name] Bertin, MD [place]

## 2020-08-03 NOTE — Assessment & Plan Note (Signed)
Reports fall on 08/01/2020, leaving her right side bruised and sore. Denies LOC, head injury, blood loss. No gait abnormality or gross abnormality. Previously encouraged by Dr. Rock Nephew to improve diet, exercise, and aerobic conditioning. Will send referral to PT today.

## 2020-08-03 NOTE — Assessment & Plan Note (Signed)
Doing well on 5 mg amlodipine. BP today 132/80. BP similar at home with personal BP cuff. No changes to management.

## 2020-08-08 ENCOUNTER — Other Ambulatory Visit: Payer: Self-pay | Admitting: Family Medicine

## 2020-08-08 DIAGNOSIS — M797 Fibromyalgia: Secondary | ICD-10-CM

## 2020-08-08 MED ORDER — GABAPENTIN 400 MG PO CAPS: 400.0000 mg | ORAL_CAPSULE | Freq: Two times a day (BID) | ORAL | 3 refills | Status: DC

## 2020-08-23 ENCOUNTER — Other Ambulatory Visit: Payer: Self-pay | Admitting: Family Medicine

## 2020-08-28 ENCOUNTER — Other Ambulatory Visit: Payer: Self-pay | Admitting: Family Medicine

## 2020-08-28 DIAGNOSIS — M791 Myalgia, unspecified site: Secondary | ICD-10-CM

## 2020-08-28 DIAGNOSIS — M797 Fibromyalgia: Secondary | ICD-10-CM

## 2020-09-01 ENCOUNTER — Other Ambulatory Visit: Payer: Self-pay | Admitting: Family Medicine

## 2020-09-19 ENCOUNTER — Other Ambulatory Visit: Payer: Self-pay | Admitting: Family Medicine

## 2020-10-02 ENCOUNTER — Other Ambulatory Visit: Payer: Self-pay

## 2020-10-02 ENCOUNTER — Ambulatory Visit: Payer: Medicaid Other | Admitting: Family Medicine

## 2020-10-02 MED ORDER — ALBUTEROL SULFATE HFA 108 (90 BASE) MCG/ACT IN AERS: INHALATION_SPRAY | RESPIRATORY_TRACT | 1 refills | Status: DC

## 2020-10-08 NOTE — Progress Notes (Signed)
    SUBJECTIVE:   CHIEF COMPLAINT / HPI:   Fibromyalgia Follow-Up Patient here for f/u on her fibromyalgia. She was seen by Dr. Jeani Hawking on 08/03/2020, at which time her amitriptyline was increased to 50mg  daily and she was started on Voltaren gel which she finds helpful. She also takes Gabapentin 400mg  BID and Cymbalta 60mg  daily. Today patient states she has good days and bad. Overall does not feel her fibromyalgia is as controlled as it could be. She was referred to physical therapy and is eager to start, but has been unable to reach their office to schedule an appointment.  Skin Lesions Patient reports skin lesions on her chest and face. They have been present for several months. They are sometimes painful/itchy and she frequently picks at them. Patient was told in the past these skin changes are because her Vit D levels are low.  Depression Patient reports her mood is pretty good although admits she is dealing with increased stress recently (related to her ex-husband's new girlfriend). She feels Cymbalta 60mg  helps significantly. No SI. She sees a therapist Garfield Cornea) very sporadically due to scheduling difficulties.  PERTINENT  PMH / PSH: depression, fibromyalgia, HTN, asthma, cerebral palsy   OBJECTIVE:   BP 128/76   Pulse (!) 112   Wt 239 lb 12.8 oz (108.8 kg)   SpO2 98%   BMI 51.89 kg/m   Gen: alert, obese, NAD Psych: appropriate affect, good insight, normal speech and thought content Skin:     ASSESSMENT/PLAN:   Depression Well-controlled on Cymbalta 60mg  daily. PHQ-9 score of 9 today, negative to question #9 (no self harm or SI). -Continue Cymbalta 60mg  daily -Encouraged increased frequency of therapy/counseling   Fibromyalgia Improved on increased dose of Amitriptyline (50mg  daily) and voltaren gel. Overall moderately well-controlled but tends to flare in the winter months. -Continue Amitriptyline, voltaren gel, gabapentin, and cymbalta -Patient is not interested in  increasing dose of gabapentin at this time -Encouraged regular exercise and patient was agreeable (she plans to walk 30 minutes 5x/week and will look into fibromyalgia aqua-therapy at the [facility])  Skin lesion Scattered hyperpigmented areas on chest and face. Patient previously told it was related to Vit D deficiency.  Differential includes prurigo nodularis, acne or folliculitis resulting in subsequent hyperpigmentation, SLE, less likely vit D deficiency or atypical acanthosis nigricans, eczema, medication reaction. -Patient will schedule appt in derm clinic    Alcus Dad, MD [place]

## 2020-10-09 ENCOUNTER — Ambulatory Visit (INDEPENDENT_AMBULATORY_CARE_PROVIDER_SITE_OTHER): Payer: Medicare Other | Admitting: Family Medicine

## 2020-10-09 ENCOUNTER — Ambulatory Visit (INDEPENDENT_AMBULATORY_CARE_PROVIDER_SITE_OTHER): Payer: Medicare Other

## 2020-10-09 ENCOUNTER — Other Ambulatory Visit: Payer: Self-pay

## 2020-10-09 ENCOUNTER — Encounter: Payer: Self-pay | Admitting: Family Medicine

## 2020-10-09 VITALS — BP 128/76 | HR 112 | Wt 239.8 lb

## 2020-10-09 DIAGNOSIS — F32A Depression, unspecified: Secondary | ICD-10-CM | POA: Diagnosis not present

## 2020-10-09 DIAGNOSIS — Z23 Encounter for immunization: Secondary | ICD-10-CM | POA: Diagnosis not present

## 2020-10-09 DIAGNOSIS — E559 Vitamin D deficiency, unspecified: Secondary | ICD-10-CM

## 2020-10-09 DIAGNOSIS — R7309 Other abnormal glucose: Secondary | ICD-10-CM

## 2020-10-09 DIAGNOSIS — I10 Essential (primary) hypertension: Secondary | ICD-10-CM

## 2020-10-09 DIAGNOSIS — L989 Disorder of the skin and subcutaneous tissue, unspecified: Secondary | ICD-10-CM

## 2020-10-09 DIAGNOSIS — M797 Fibromyalgia: Secondary | ICD-10-CM | POA: Diagnosis not present

## 2020-10-09 MED ORDER — NYSTATIN 100000 UNIT/GM EX POWD: 1.0000 "application " | Freq: Three times a day (TID) | CUTANEOUS | 2 refills | Status: DC

## 2020-10-09 NOTE — Assessment & Plan Note (Signed)
Improved on increased dose of Amitriptyline (50mg  daily) and voltaren gel. Overall moderately well-controlled but tends to flare in the winter months. -Continue Amitriptyline, voltaren gel, gabapentin, and cymbalta -Patient is not interested in increasing dose of gabapentin at this time -Encouraged regular exercise and patient was agreeable (she plans to walk 30 minutes 5x/week and will look into fibromyalgia aqua-therapy at the [facility])

## 2020-10-09 NOTE — Assessment & Plan Note (Signed)
Scattered hyperpigmented areas on chest and face. Patient previously told it was related to Vit D deficiency.  Differential includes prurigo nodularis, acne or folliculitis resulting in subsequent hyperpigmentation, SLE, less likely vit D deficiency or atypical acanthosis nigricans, eczema, medication reaction. -Patient will schedule appt in derm clinic

## 2020-10-09 NOTE — Assessment & Plan Note (Signed)
Well-controlled on Cymbalta 60mg  daily. PHQ-9 score of 9 today, negative to question #9 (no self harm or SI). -Continue Cymbalta 60mg  daily -Encouraged increased frequency of therapy/counseling

## 2020-10-09 NOTE — Patient Instructions (Addendum)
It was a pleasure to see you, as always!  Our plans for today:  - We are checking some basic labs today. I will call you if the results are abnormal - Please call the physical therapy office as soon as possible to schedule an appointment - Continue walking (goal = 30 minutes at least 5 days/week) - Keep taking your medications as prescribed - Make an appointment in our dermatology clinic for your skin changes  Take care and seek immediate care sooner if you develop any concerns.   Dr. [facility]

## 2020-10-10 LAB — VITAMIN D 25 HYDROXY (VIT D DEFICIENCY, FRACTURES): Vit D, 25-Hydroxy: 10.7 ng/mL — ABNORMAL LOW (ref 30.0–100.0)

## 2020-10-10 LAB — BASIC METABOLIC PANEL
BUN/Creatinine Ratio: 10 (ref 9–23)
BUN: 6 mg/dL (ref 6–24)
CO2: 20 mmol/L (ref 20–29)
Calcium: 9.7 mg/dL (ref 8.7–10.2)
Chloride: 98 mmol/L (ref 96–106)
Creatinine, Ser: 0.63 mg/dL (ref 0.57–1.00)
Glucose: 109 mg/dL — ABNORMAL HIGH (ref 65–99)
Potassium: 4.1 mmol/L (ref 3.5–5.2)
Sodium: 137 mmol/L (ref 134–144)
eGFR: 114 mL/min/{1.73_m2} (ref >59)

## 2020-10-10 LAB — HEMOGLOBIN A1C
Est. average glucose Bld gHb Est-mCnc: 137 mg/dL
Hgb A1c MFr Bld: 6.4 % — ABNORMAL HIGH (ref 4.8–5.6)

## 2020-10-23 ENCOUNTER — Telehealth: Payer: Self-pay

## 2020-10-23 MED ORDER — VITAMIN D (ERGOCALCIFEROL) 1.25 MG (50000 UNIT) PO CAPS: 50000.0000 [IU] | ORAL_CAPSULE | ORAL | 0 refills | Status: DC

## 2020-10-23 NOTE — Telephone Encounter (Signed)
Patient calls nurse line with insurance issues for vitamin D capsules. Called pharmacy. Pharmacist states that patient's insurance plan does not cover any Vitamin D supplements, however, was able to use a coupon and get price to $7.   Called patient and informed. Patient is agreeable to using coupon.   Talbot Grumbling, RN

## 2020-10-23 NOTE — Addendum Note (Signed)
Addended by: Alcus Dad on: 10/23/2020 08:58 AM   Modules accepted: Orders

## 2020-10-25 ENCOUNTER — Ambulatory Visit (INDEPENDENT_AMBULATORY_CARE_PROVIDER_SITE_OTHER): Payer: Medicare Other | Admitting: Family Medicine

## 2020-10-25 ENCOUNTER — Other Ambulatory Visit: Payer: Self-pay

## 2020-10-25 VITALS — BP 132/80 | HR 100 | Ht 59.0 in | Wt 239.6 lb

## 2020-10-25 DIAGNOSIS — L281 Prurigo nodularis: Secondary | ICD-10-CM

## 2020-10-25 MED ORDER — BETAMETHASONE DIPROPIONATE 0.05 % EX CREA: TOPICAL_CREAM | Freq: Two times a day (BID) | CUTANEOUS | 0 refills | Status: DC

## 2020-10-25 MED ORDER — HYDROXYZINE PAMOATE 25 MG PO CAPS: 25.0000 mg | ORAL_CAPSULE | Freq: Three times a day (TID) | ORAL | 0 refills | Status: DC | PRN

## 2020-10-25 NOTE — Patient Instructions (Addendum)
It was great seeing you today!  It looks like the areas on your skin are due to something called prurigo nodularis. The cause of this is uncertain but to prevent further areas from becoming like this and allow the existing ones to heal, it is best that you avoid itching.   For this we are prescribing atarax for itching, please take this about 3 times a day as needed. Please do not use atarax when driving or operating any machinery as this can make you drowsy. Also we have prescribed a cream to use, please apply this to the affected areas 1-2 times daily.  Please follow up at your next scheduled appointment, if anything arises between now and then, please don't hesitate to contact our office.   Thank you for allowing [place] to be a part of your medical care!  Thank you, Dr. Larae Grooms

## 2020-10-25 NOTE — Progress Notes (Signed)
    SUBJECTIVE:   CHIEF COMPLAINT / HPI:   Rash on chest Endorsing rash on chest that has been ongoing for the past few months. Starts out as pimples with drainage consistent with pus and then as they heal form darkened areas. Other areas will started out as darkened areas only to remain that way. Only noted to have them along her chest and abdomen, none on the posterior surface. Denies pain but endorses pruritus. Denies fever or recent illness.   OBJECTIVE:   BP 132/80   Pulse 100   Ht 4\' 11"  (1.499 m)   Wt 239 lb 9.6 oz (108.7 kg)   SpO2 98%   BMI 48.39 kg/m   General: Patient well-appearing, in no acute distress. Resp: normal work of breathing Derm:1-3 cm hyperpigmented, circumscribed areas along the anterior portion of chest and abdomen without drainage. 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

## 2020-10-25 NOTE — Assessment & Plan Note (Signed)
-  given appearance consistent with prurigo nodularis, discussed with patient that although exact cause is unknown, pruritus can cause discomfort and more affected areas -prescribed betamethasone cream for affected aresa and atarax for pruritus, med instructions discussed including avoiding use when operating machinery  -f/u as needed

## 2020-11-01 ENCOUNTER — Other Ambulatory Visit: Payer: Self-pay | Admitting: Family Medicine

## 2020-11-05 ENCOUNTER — Encounter: Payer: Self-pay | Admitting: Family Medicine

## 2020-11-05 ENCOUNTER — Ambulatory Visit: Payer: Medicare Other | Admitting: Physical Therapy

## 2020-11-05 ENCOUNTER — Ambulatory Visit (INDEPENDENT_AMBULATORY_CARE_PROVIDER_SITE_OTHER): Payer: Medicare Other | Admitting: Family Medicine

## 2020-11-05 ENCOUNTER — Other Ambulatory Visit: Payer: Self-pay

## 2020-11-05 VITALS — BP 126/70 | HR 105 | Ht <= 58 in | Wt 242.6 lb

## 2020-11-05 DIAGNOSIS — R7303 Prediabetes: Secondary | ICD-10-CM | POA: Diagnosis not present

## 2020-11-05 DIAGNOSIS — R000 Tachycardia, unspecified: Secondary | ICD-10-CM

## 2020-11-05 DIAGNOSIS — Z6841 Body Mass Index (BMI) 40.0 and over, adult: Secondary | ICD-10-CM | POA: Diagnosis not present

## 2020-11-05 MED ORDER — METFORMIN HCL ER 500 MG PO TB24: 500.0000 mg | ORAL_TABLET | Freq: Every day | ORAL | 0 refills | Status: DC

## 2020-11-05 NOTE — Assessment & Plan Note (Signed)
Newly diagnosed. A1c 6.4%. Patient certainly at risk for progression to diabetes given obesity, family hx of diabetes, and A1c of 6.4%. Has not had much success with lifestyle modifications. -Referral to Dr. Jenne Campus for obesity/diabetes nutrition education -Will start Metformin XR 500mg  daily

## 2020-11-05 NOTE — Assessment & Plan Note (Signed)
BMI 52.5 today. -Counseled on importance of physical activity and healthy food/drink choices -Referral placed to Dr. Jenne Campus -Will check lipid panel today

## 2020-11-05 NOTE — Progress Notes (Signed)
    SUBJECTIVE:   CHIEF COMPLAINT / HPI: Discuss lab results/prediabetes  Patient here to discuss lab results from last visit. Her A1c was found to be 6.4% at that time (10/09/20). She has been working on lifestyle modifications including healthy food choices. States she does not eat sweets or drink sugar-sweetened beverages regularly. Reports she has been incorporating more fish into her diet. Has not been successful in exercising regularly.  Several of her family members have diabetes (Dad, uncle, cousins).  PERTINENT  PMH / PSH: Obesity, HTN, cerebral palsy, asthma, fibromyalgia, depression  OBJECTIVE:   BP 126/70   Pulse (!) 105   Ht 4\' 9"  (1.448 m)   Wt 242 lb 9.6 oz (110 kg)   SpO2 97%   BMI 52.50 kg/m   Gen: obese CV: tachycardic, normal S1/S2 without m/r/g Resp: mildly increased WOB with speaking in full sentences, no respiratory distress, lungs CTAB Neuro: grossly intact  ASSESSMENT/PLAN:   Prediabetes Newly diagnosed. A1c 6.4%. Patient certainly at risk for progression to diabetes given obesity, family hx of diabetes, and A1c of 6.4%. Has not had much success with lifestyle modifications. -Referral to Dr. Jenne Campus for obesity/diabetes nutrition education -Will start Metformin XR 500mg  daily  Sinus tachycardia Patient noted to be tachycardic at majority of her clinic visits dating back several years. Today her HR was 122 initially, recheck after resting for several minutes was 105. No known hx of anemia or thyroid dysfunction. I suspect it is related to severe deconditioning as patient has BMI >50 and becomes short of breath with minimal activity. No concern for alcohol withdrawal (drinks 2 glasses of wine per week). Of note, she had an echo within the past year which showed grade I diastolic dysfunction and EF 70-75% but was otherwise normal. Last EKG with early repol, no other significant abnormality. -Will check CBC and TSH today -Counseling provided on lifestyle  modifications (diet/exercise) to achieve healthier BMI and improve overall cardiovascular conditioning  Class 3 severe obesity with serious comorbidity and body mass index (BMI) of 50.0 to 59.9 in adult (HCC) BMI 52.5 today. -Counseled on importance of physical activity and healthy food/drink choices -Referral placed to Dr. Jenne Campus -Will check lipid panel today   Alcus Dad, MD [place]

## 2020-11-05 NOTE — Patient Instructions (Addendum)
It was great to see you!  Our plans for today:  -We are starting a medication called Metformin. This can sometimes cause an upset stomach. You can take it with food or at night if that's helpful. If you are unable to tolerate the medication due to side effects, please let me know.  -We are checking your cholesterol levels. Depending on the results, you may need to start a medication called a "statin". I will call you if that's the case.  -We are also checking your thyroid function and your hemoglobin level.  -It is incredibly important that you continue to work on lifestyle changes, including regular exercise and healthy food choices. You should exercise AT LEAST 10 minutes every single day. The eventual goal is 30 minutes 5x per week. In order to help you with some of these lifestyle changes, I have referred you to our nutritionist. You should call her at [phone] to schedule an appointment.  Take care and seek immediate care sooner if you develop any concerns.   Dr. [facility]

## 2020-11-05 NOTE — Assessment & Plan Note (Addendum)
Patient noted to be tachycardic at majority of her clinic visits dating back several years. Today her HR was 122 initially, recheck after resting for several minutes was 105. No known hx of anemia or thyroid dysfunction. I suspect it is related to severe deconditioning as patient has BMI >50 and becomes short of breath with minimal activity. No concern for alcohol withdrawal (drinks 2 glasses of wine per week). Of note, she had an echo within the past year which showed grade I diastolic dysfunction and EF 70-75% but was otherwise normal. Last EKG with early repol, no other significant abnormality. -Will check CBC and TSH today -Counseling provided on lifestyle modifications (diet/exercise) to achieve healthier BMI and improve overall cardiovascular conditioning

## 2020-11-06 LAB — CBC
Hematocrit: 37.3 % (ref 34.0–46.6)
Hemoglobin: 12.7 g/dL (ref 11.1–15.9)
MCH: 30.3 pg (ref 26.6–33.0)
MCHC: 34 g/dL (ref 31.5–35.7)
MCV: 89 fL (ref 79–97)
Platelets: 284 10*3/uL (ref 150–450)
RBC: 4.19 x10E6/uL (ref 3.77–5.28)
RDW: 12.6 % (ref 11.7–15.4)
WBC: 5.5 10*3/uL (ref 3.4–10.8)

## 2020-11-06 LAB — LIPID PANEL
Chol/HDL Ratio: 3.5 ratio (ref 0.0–4.4)
Cholesterol, Total: 199 mg/dL (ref 100–199)
HDL: 57 mg/dL (ref >39)
LDL Chol Calc (NIH): 86 mg/dL (ref 0–99)
Triglycerides: 347 mg/dL — ABNORMAL HIGH (ref 0–149)
VLDL Cholesterol Cal: 56 mg/dL — ABNORMAL HIGH (ref 5–40)

## 2020-11-06 LAB — TSH: TSH: 1.78 u[IU]/mL (ref 0.450–4.500)

## 2020-11-12 ENCOUNTER — Encounter: Payer: Self-pay | Admitting: Family Medicine

## 2020-11-19 ENCOUNTER — Ambulatory Visit: Payer: Medicaid Other | Admitting: Physical Therapy

## 2021-01-03 ENCOUNTER — Ambulatory Visit: Payer: Medicaid Other | Admitting: Family Medicine

## 2021-01-26 ENCOUNTER — Other Ambulatory Visit: Payer: Self-pay | Admitting: Family Medicine

## 2021-02-01 ENCOUNTER — Other Ambulatory Visit: Payer: Self-pay | Admitting: Family Medicine

## 2021-02-01 DIAGNOSIS — R7303 Prediabetes: Secondary | ICD-10-CM

## 2021-03-04 DIAGNOSIS — M76822 Posterior tibial tendinitis, left leg: Secondary | ICD-10-CM | POA: Diagnosis not present

## 2021-05-01 ENCOUNTER — Other Ambulatory Visit: Payer: Self-pay | Admitting: Family Medicine

## 2021-05-01 DIAGNOSIS — I10 Essential (primary) hypertension: Secondary | ICD-10-CM

## 2021-05-02 ENCOUNTER — Other Ambulatory Visit: Payer: Self-pay | Admitting: Family Medicine

## 2021-05-02 DIAGNOSIS — R7303 Prediabetes: Secondary | ICD-10-CM

## 2021-05-11 ENCOUNTER — Other Ambulatory Visit: Payer: Self-pay | Admitting: Family Medicine

## 2021-06-13 ENCOUNTER — Other Ambulatory Visit: Payer: Self-pay | Admitting: Family Medicine

## 2021-06-13 DIAGNOSIS — M791 Myalgia, unspecified site: Secondary | ICD-10-CM

## 2021-06-13 DIAGNOSIS — M797 Fibromyalgia: Secondary | ICD-10-CM

## 2021-06-14 ENCOUNTER — Other Ambulatory Visit: Payer: Self-pay | Admitting: Family Medicine

## 2021-06-14 DIAGNOSIS — R7303 Prediabetes: Secondary | ICD-10-CM

## 2021-06-14 DIAGNOSIS — M797 Fibromyalgia: Secondary | ICD-10-CM

## 2021-06-14 MED ORDER — FLUTICASONE FUROATE-VILANTEROL 200-25 MCG/ACT IN AEPB: 1.0000 | INHALATION_SPRAY | Freq: Every day | RESPIRATORY_TRACT | 1 refills | Status: DC

## 2021-07-04 ENCOUNTER — Other Ambulatory Visit: Payer: Self-pay

## 2021-07-04 ENCOUNTER — Ambulatory Visit (INDEPENDENT_AMBULATORY_CARE_PROVIDER_SITE_OTHER): Payer: Medicare Other | Admitting: Family Medicine

## 2021-07-04 VITALS — BP 137/89 | HR 100 | Ht <= 58 in | Wt 236.8 lb

## 2021-07-04 DIAGNOSIS — J4541 Moderate persistent asthma with (acute) exacerbation: Secondary | ICD-10-CM | POA: Diagnosis not present

## 2021-07-04 MED ORDER — PREDNISONE 50 MG PO TABS: ORAL_TABLET | ORAL | 5 refills | Status: DC

## 2021-07-04 NOTE — Patient Instructions (Addendum)
Thank you for coming to see me today. It was a pleasure.   Start Prednisone 40 mg daily for 5 days Continue Albuterol inhaler 2-4 puffs every 2-4 hours Continue Breo Ellipta daily   Go to [facility] for xray of chest.  Your PAP smear is due.  Please schedule an appointment at your earliest convenience  Please follow-up with PCP as needed or sooner if symptoms worsen  If you have any questions or concerns, please do not hesitate to call the office at [phone].  Best,   Carollee Leitz, MD

## 2021-07-04 NOTE — Progress Notes (Signed)
    SUBJECTIVE:   CHIEF COMPLAINT / HPI: worsening cough  Reports cough for 8-9 days.  Daughter has similar symptoms.  Cough productive with increase in clear mucoid sputum.  Denies any fevers, sore throat, pain on inspiration, or chest pain.  Has had increase in shortness of breath given history of Asthma. Reports having to increase use of Albuterol inhaler to 2 puffs q2-4 hours. Does get some relief.  Just had dose prior to clinic visit.  PERTINENT  PMH / PSH:  Asthma  OBJECTIVE:   BP 137/89   Pulse 100   Ht 4\' 9"  (1.448 m)   Wt 236 lb 12.8 oz (107.4 kg)   LMP 06/18/2021 (Exact Date)   SpO2 99%   BMI 51.24 kg/m    General: Alert, no acute distress Cardio: tachycardic, regular, no r/m/g Pulm: Prolonged expiratory phase with wheezing, able to get good air entry  ASSESSMENT/PLAN:   Asthma Mild exacerbation.  Continue Albuterol and Ellipta as previously prescribed Start Prednisone 40 mg daily x 5 days Chest xray 2 view give ongoing for >1 week without improvement Follow up with PCP if symptoms do not improve Strict return precautions provided     Carollee Leitz, MD [place]

## 2021-07-08 ENCOUNTER — Ambulatory Visit
Admission: RE | Admit: 2021-07-08 | Discharge: 2021-07-08 | Disposition: A | Discharge status: Home / Self Care | Payer: Medicare Other | Source: Ambulatory Visit | Attending: Family Medicine | Admitting: Family Medicine

## 2021-07-08 ENCOUNTER — Other Ambulatory Visit: Payer: Self-pay

## 2021-07-08 ENCOUNTER — Encounter: Payer: Self-pay | Admitting: Family Medicine

## 2021-07-08 DIAGNOSIS — R059 Cough, unspecified: Secondary | ICD-10-CM | POA: Diagnosis not present

## 2021-07-08 DIAGNOSIS — J4541 Moderate persistent asthma with (acute) exacerbation: Secondary | ICD-10-CM

## 2021-07-08 NOTE — Assessment & Plan Note (Signed)
Mild exacerbation.  Continue Albuterol and Ellipta as previously prescribed Start Prednisone 40 mg daily x 5 days Chest xray 2 view give ongoing for >1 week without improvement Follow up with PCP if symptoms do not improve Strict return precautions provided

## 2021-07-09 ENCOUNTER — Encounter: Payer: Self-pay | Admitting: Family Medicine

## 2021-07-10 ENCOUNTER — Telehealth: Payer: Self-pay

## 2021-07-10 NOTE — Telephone Encounter (Signed)
Patient calls nurse line requesting to speak with provider regarding chest XR results.   Please advise.   Talbot Grumbling, RN

## 2021-07-11 ENCOUNTER — Encounter: Payer: Self-pay | Admitting: Family Medicine

## 2021-07-11 ENCOUNTER — Other Ambulatory Visit: Payer: Self-pay | Admitting: Family Medicine

## 2021-07-11 NOTE — Telephone Encounter (Signed)
Returned call.  No answer.  LVM for patient to return call.  Chest xray does not show any infection.  Thanks Carollee Leitz, MD [facility]

## 2021-07-17 IMAGING — CR DG ANKLE COMPLETE 3+V*L*
3 series · 3 of 3 positions shown · non-contrast
Comparison: 09/10/2016

CLINICAL DATA: Pain status post fall

EXAM:
LEFT ANKLE COMPLETE - 3+ VIEW

[x ankle ap left]
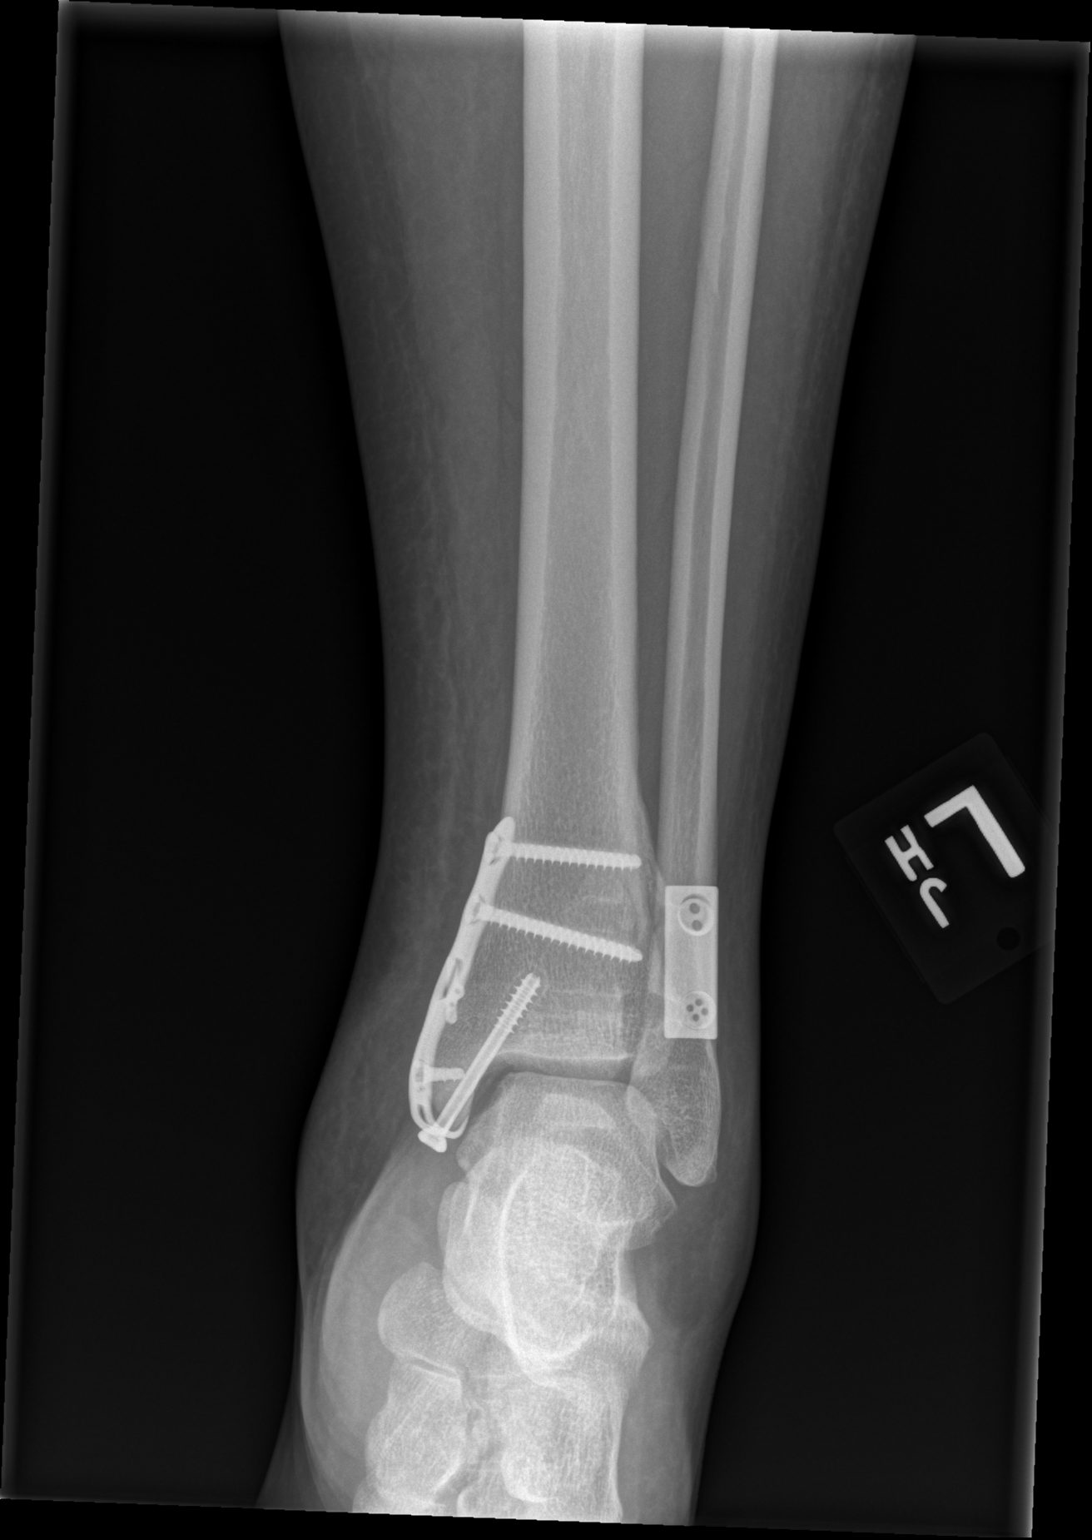

[x ankle obl left]
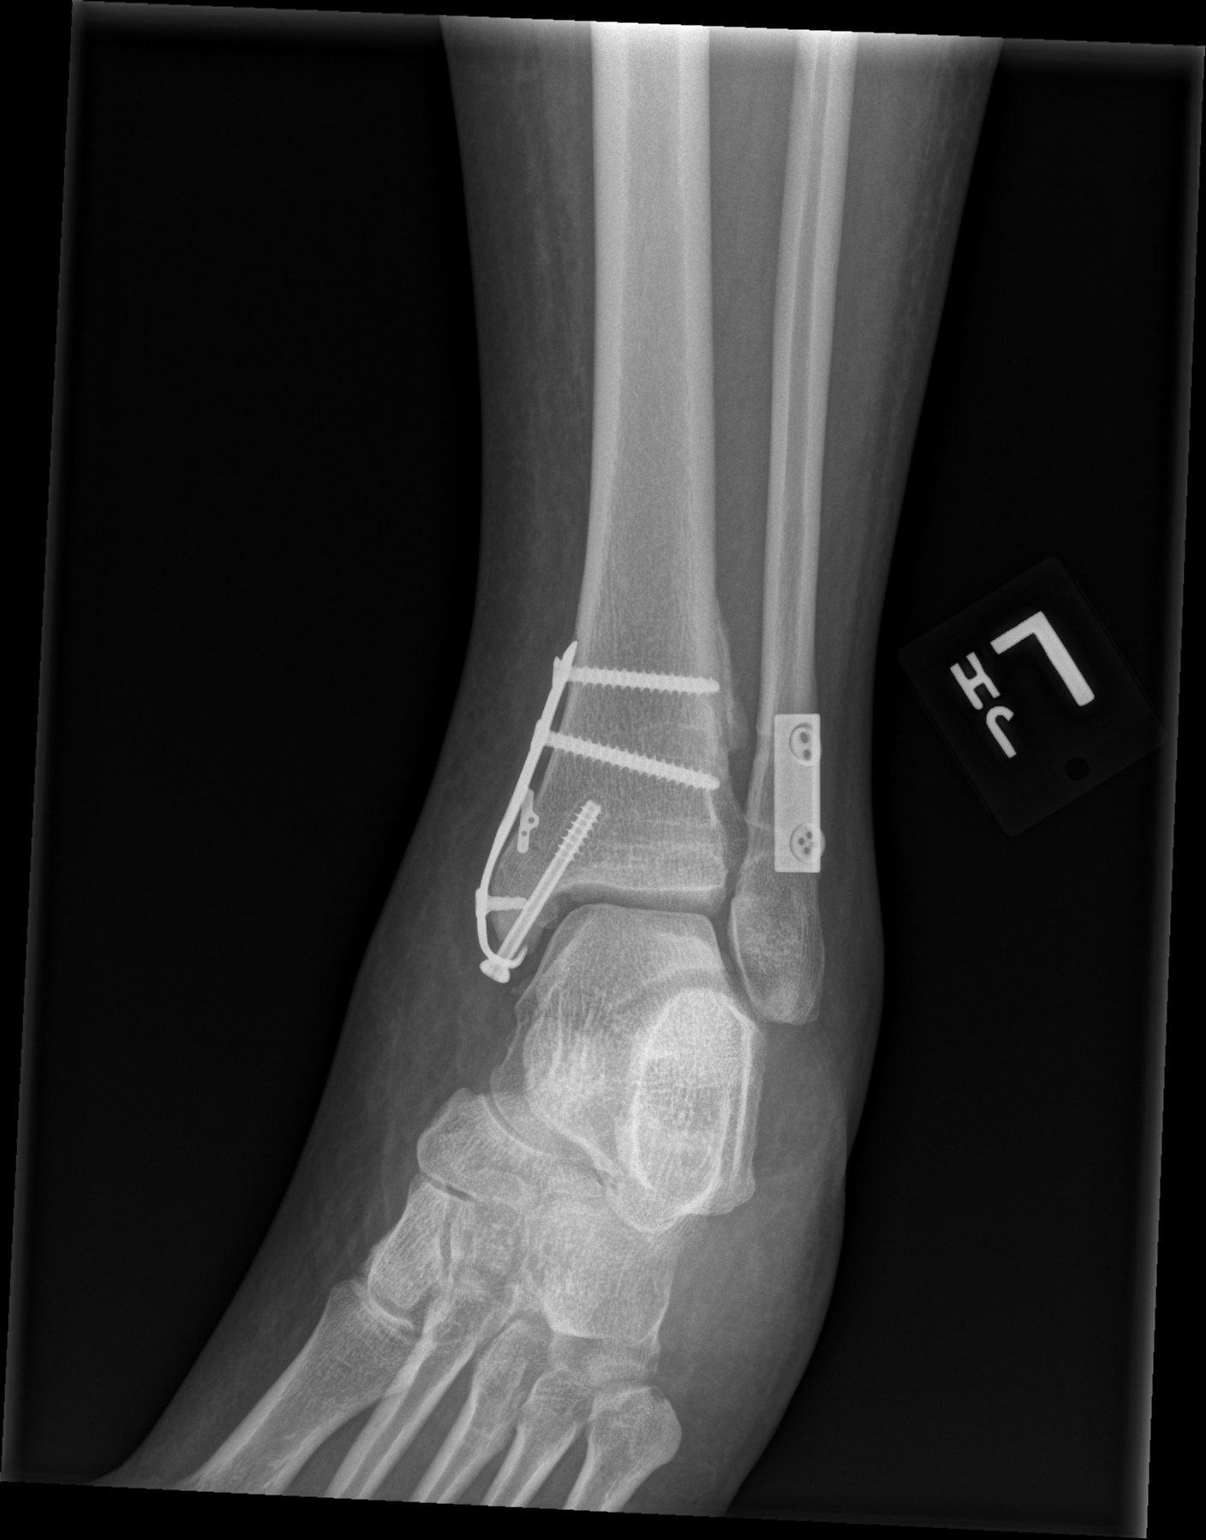

[x ankle lat left]
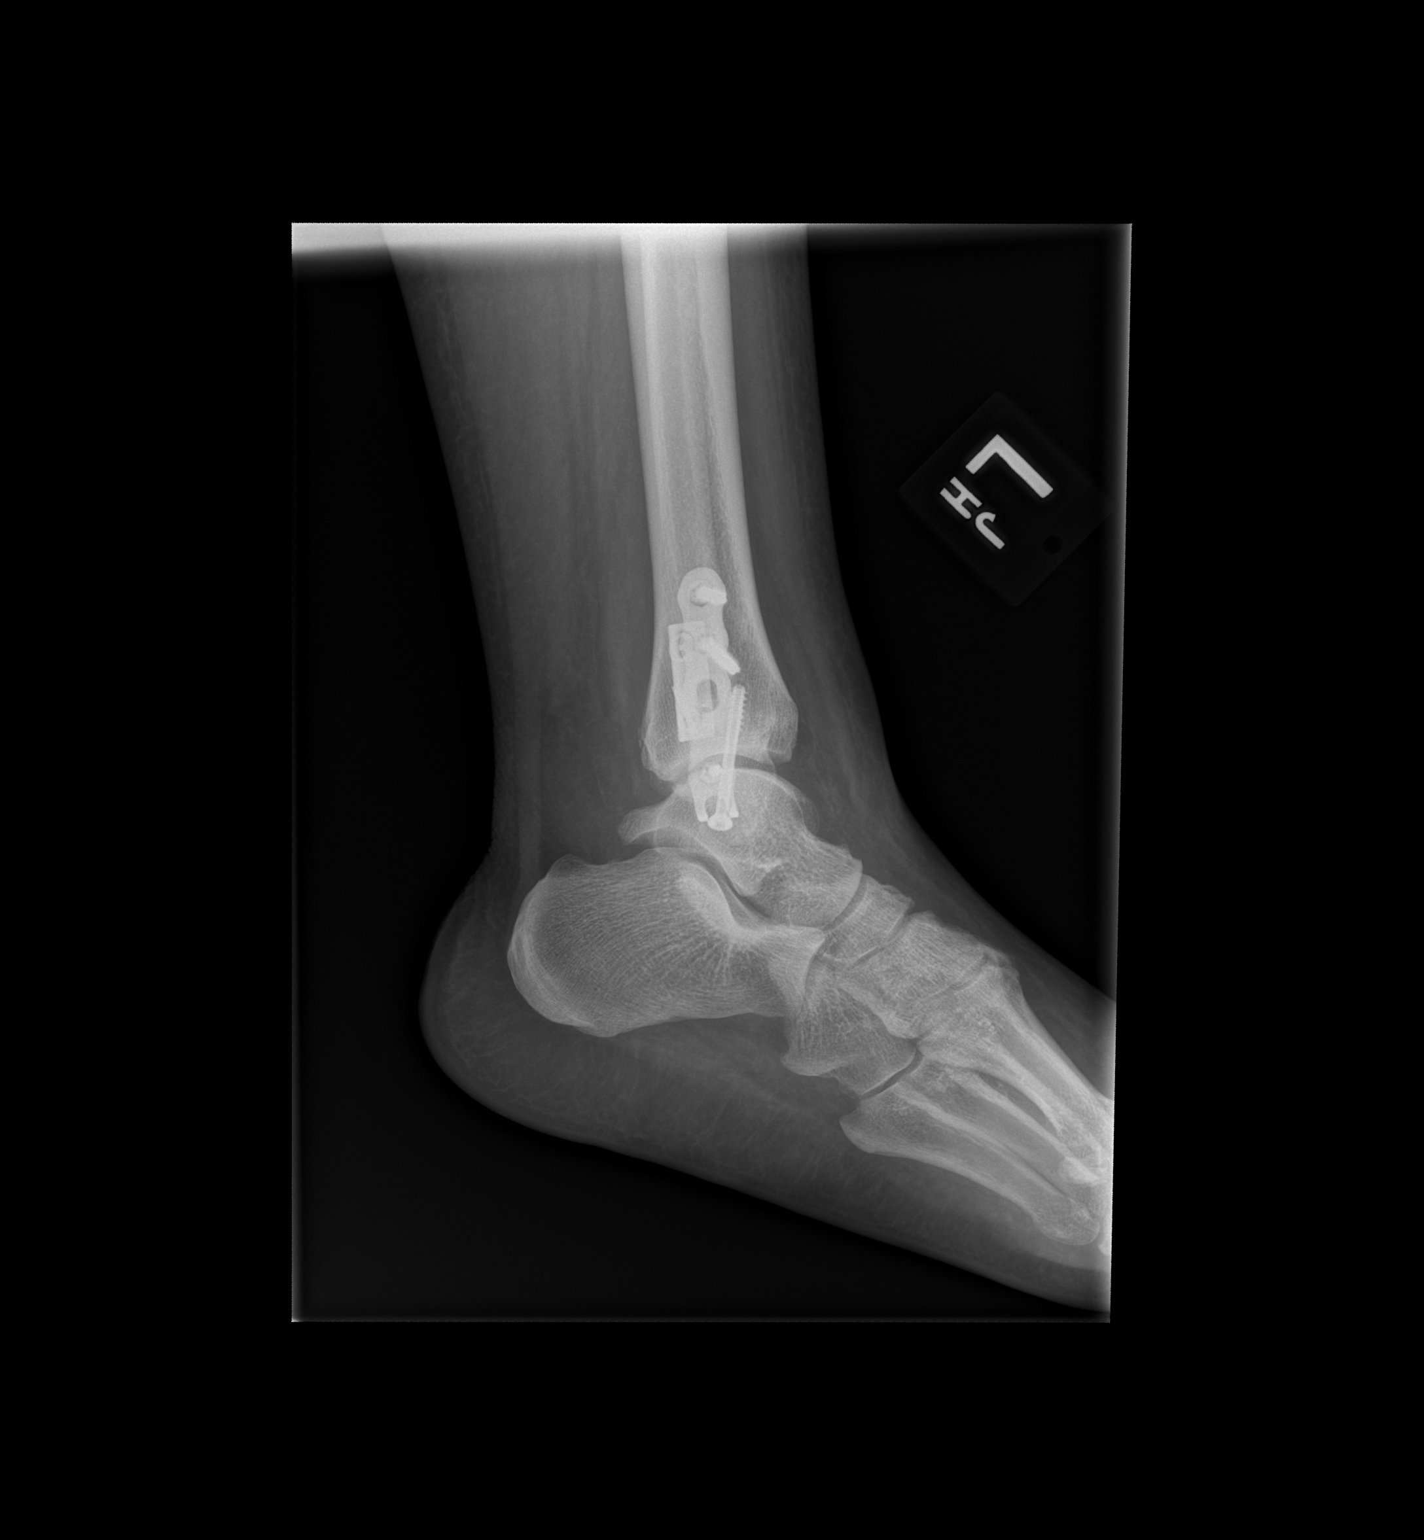

[3 of 3 positions shown; findings below may reference images not displayed]

FINDINGS: The patient is status post prior ORIF of the distal tibia and
fibula. The hardware is intact. There is some loosening about the
transcortical screw through the medial malleolus. There is soft
tissue swelling about the ankle. There is no acute displaced
fracture or dislocation.
IMPRESSION: 1. No acute displaced fracture or dislocation.
2. Status post remote ORIF of the left ankle as detailed above.

## 2021-07-17 IMAGING — CR DG KNEE COMPLETE 4+V*R*
4 series · 4 of 4 positions shown · non-contrast
Comparison: None.

CLINICAL DATA: Pain status post fall

EXAM:
RIGHT KNEE - COMPLETE 4+ VIEW

[x knee ap right (1 of 3)]
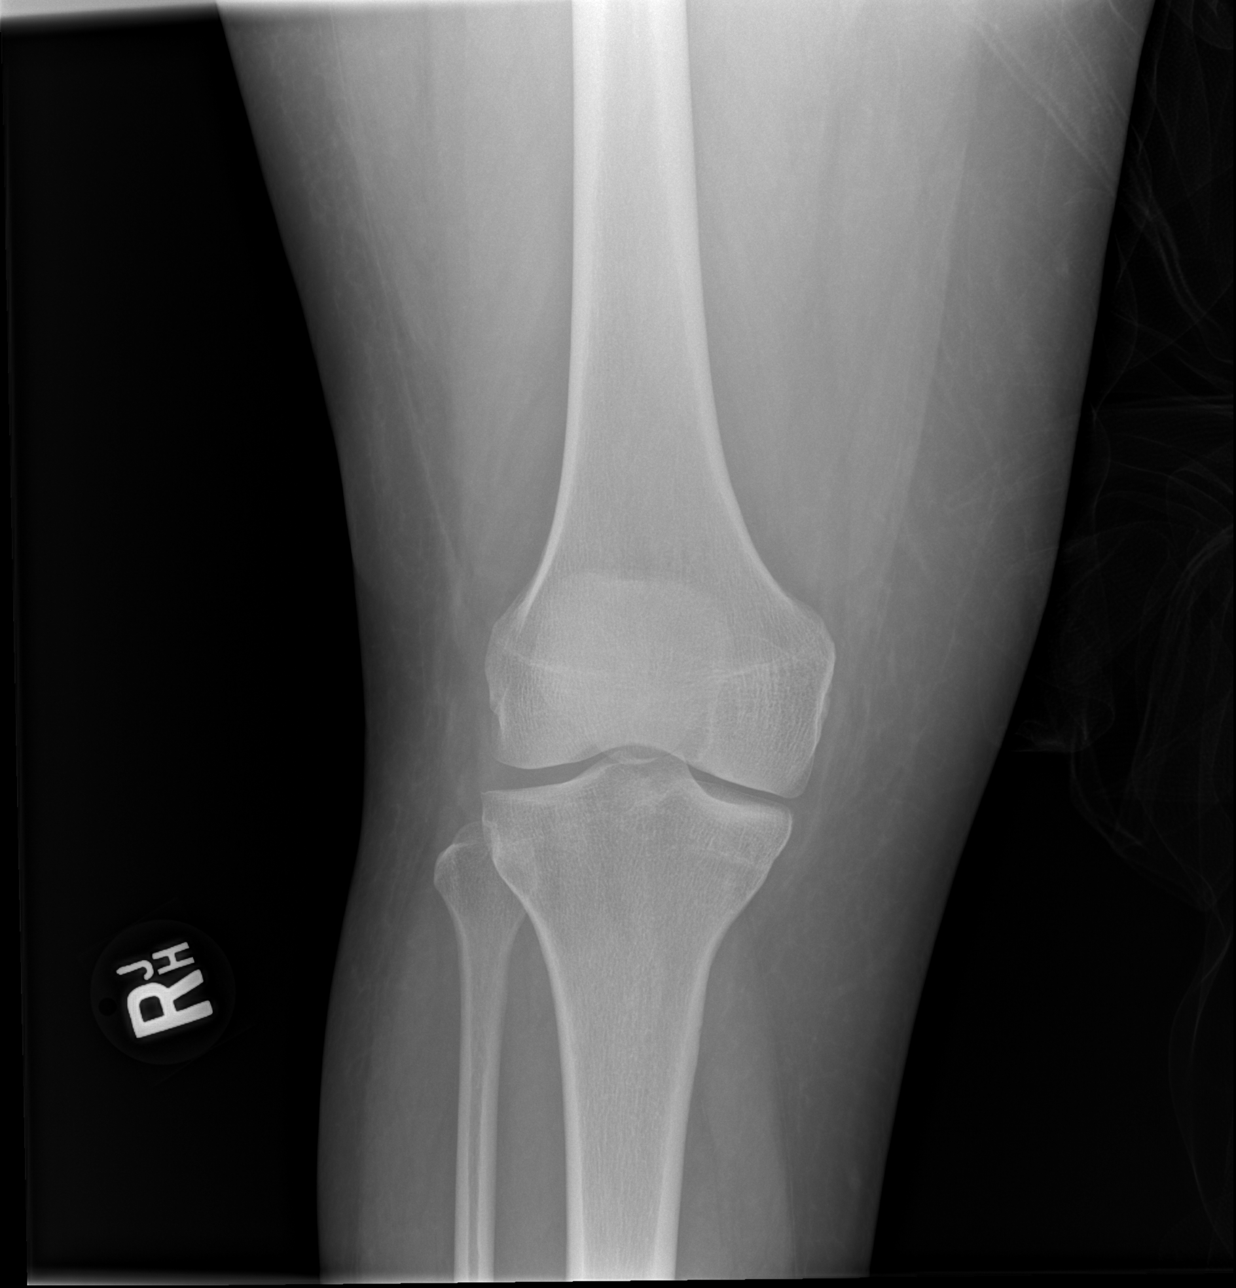

[x knee ap right (2 of 3)]
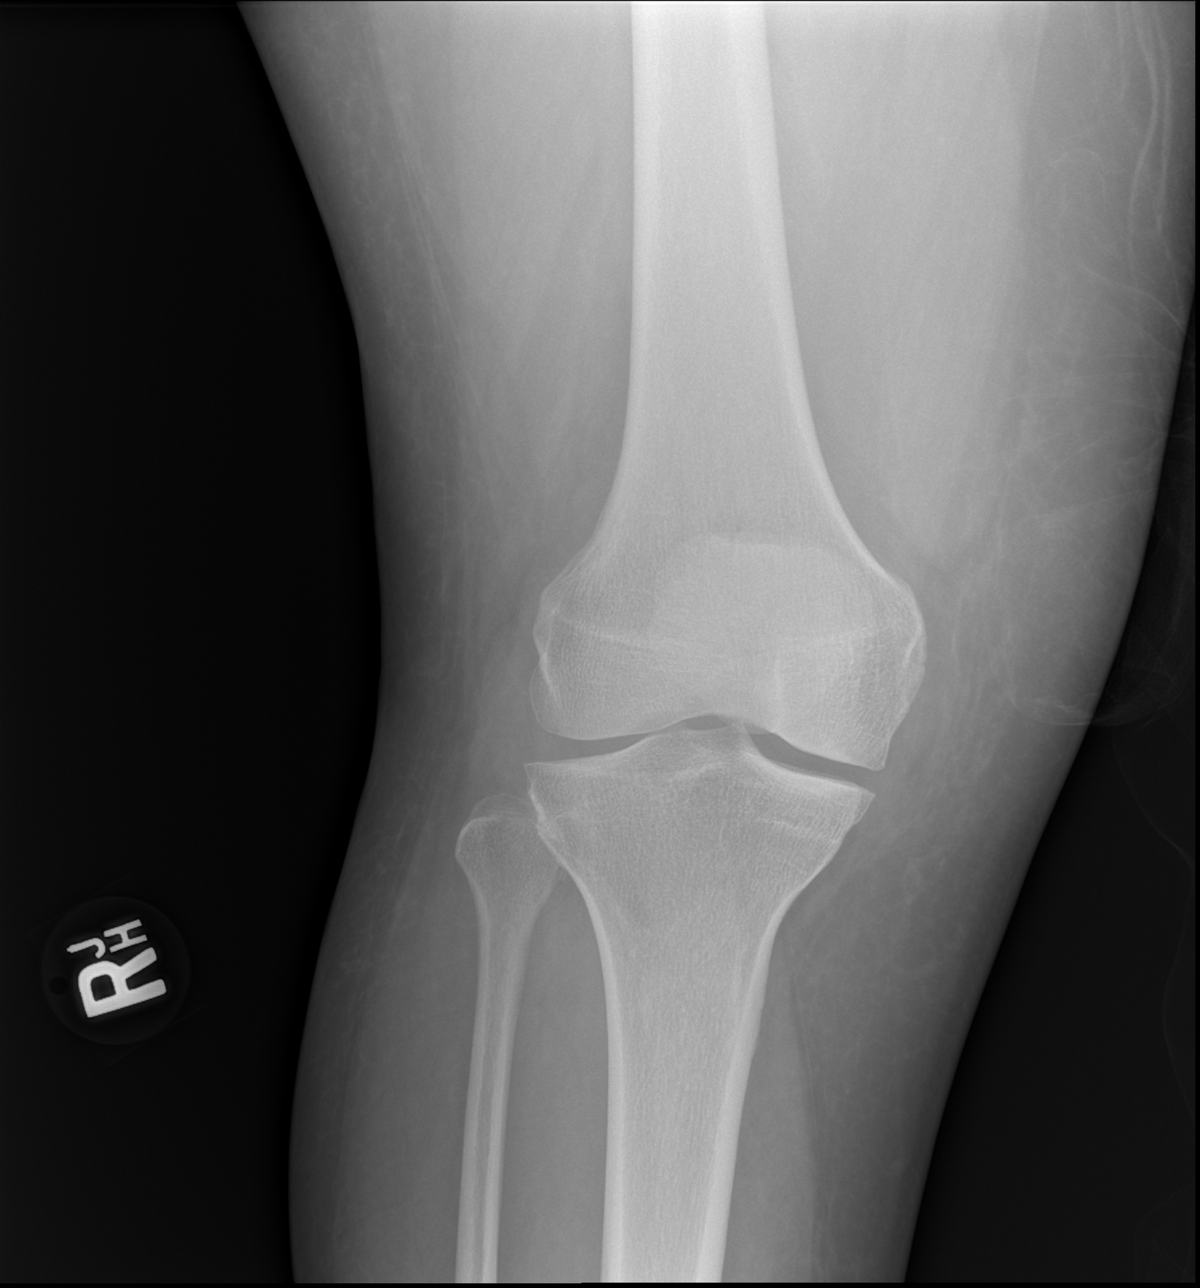

[x knee ap right (3 of 3)]
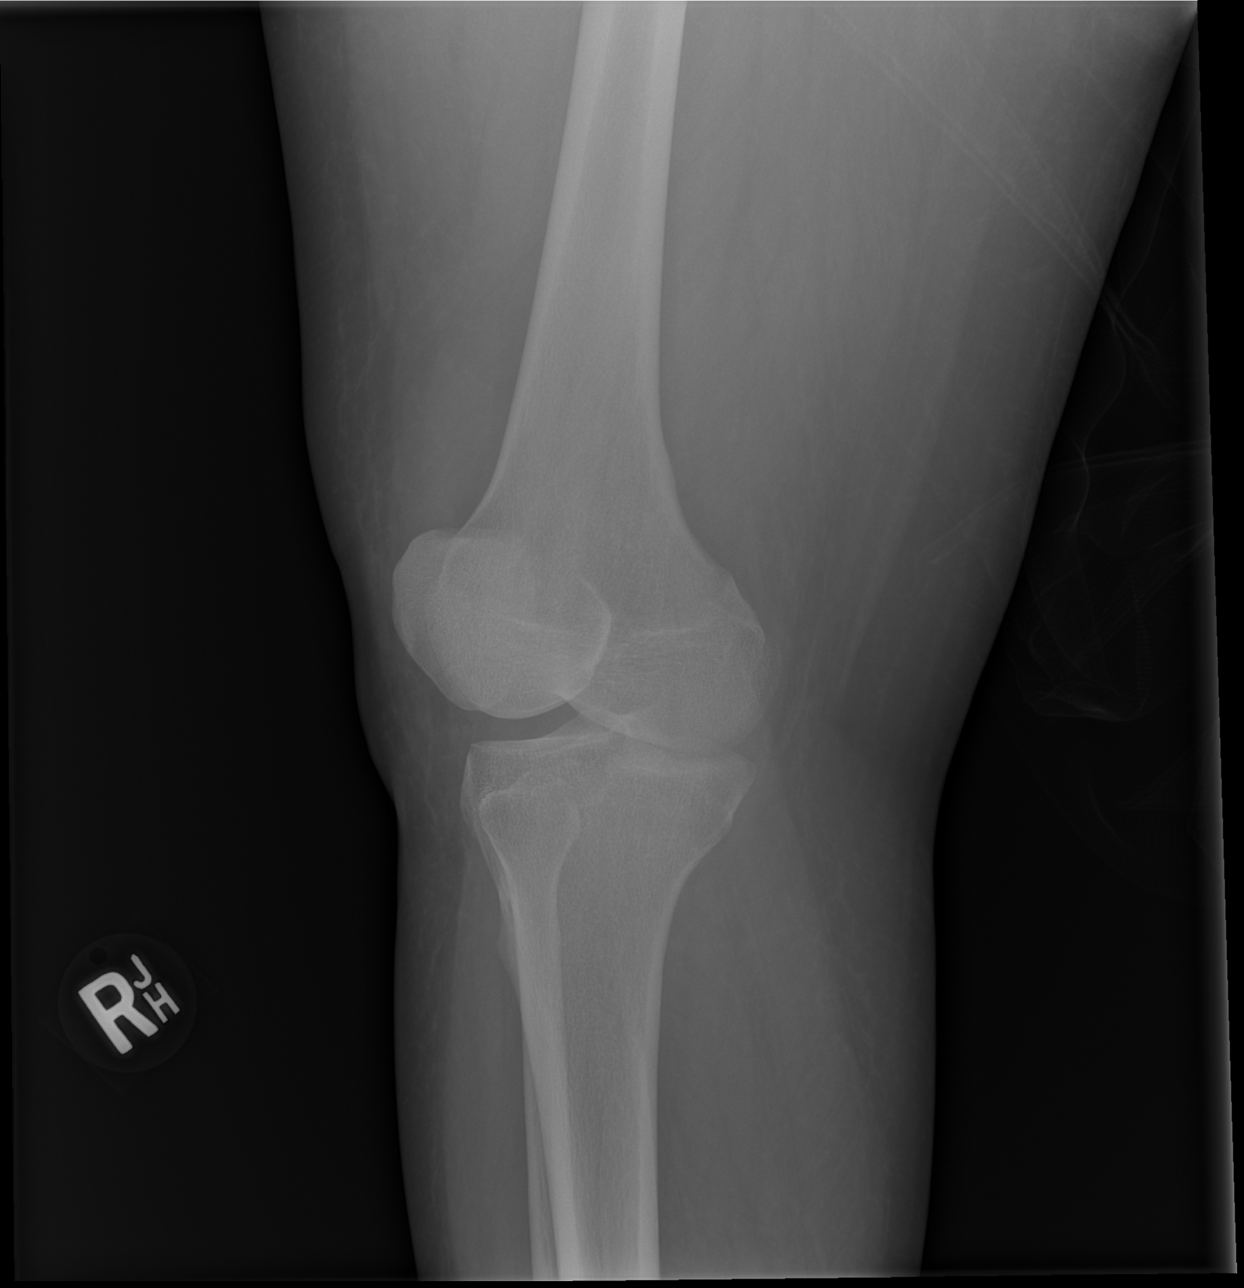

[x knee lat right]
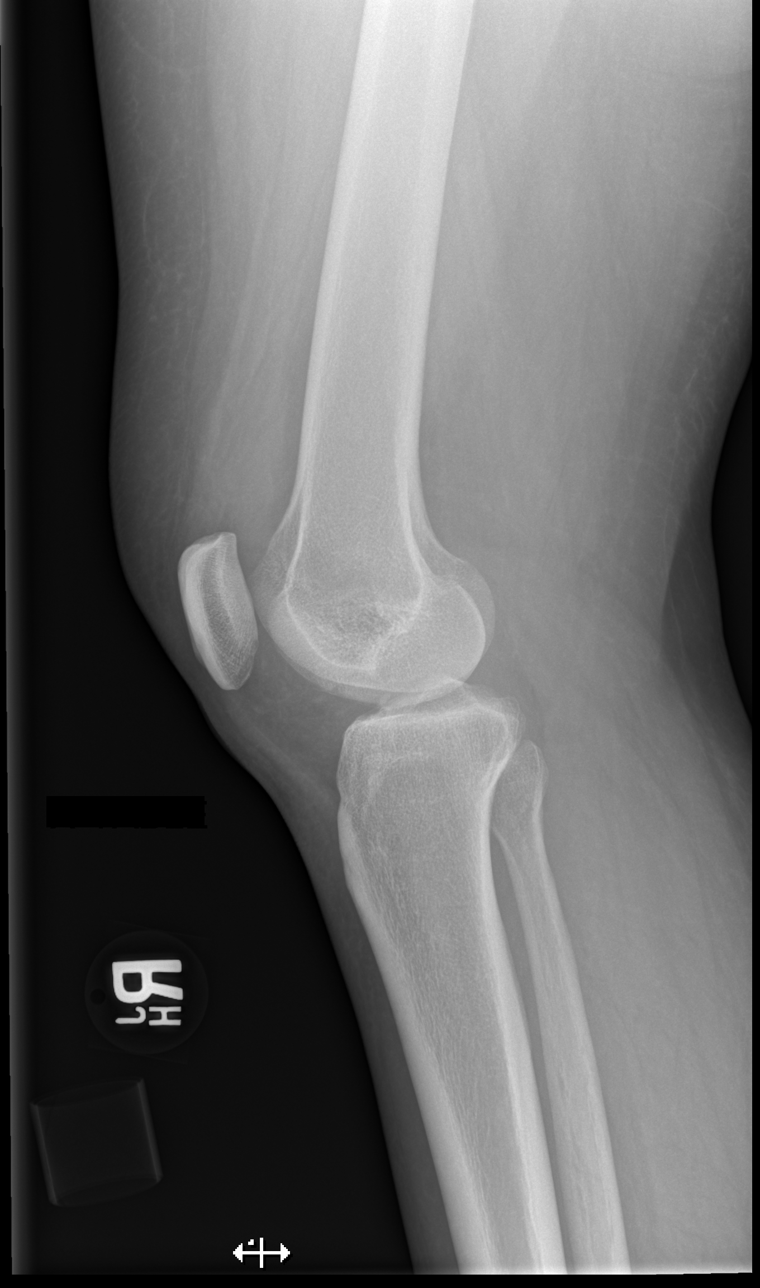

[4 of 4 positions shown; findings below may reference images not displayed]

FINDINGS: There is no acute displaced fracture. No dislocation. There is a
small suprapatellar joint effusion. The joint spaces are well
preserved.
IMPRESSION: Small suprapatellar joint effusion. No acute fracture or
dislocation.

## 2021-08-05 DIAGNOSIS — M62830 Muscle spasm of back: Secondary | ICD-10-CM | POA: Diagnosis not present

## 2021-08-25 ENCOUNTER — Other Ambulatory Visit: Payer: Self-pay | Admitting: Family Medicine

## 2021-08-26 ENCOUNTER — Ambulatory Visit (INDEPENDENT_AMBULATORY_CARE_PROVIDER_SITE_OTHER): Payer: Medicare Other | Admitting: Family Medicine

## 2021-08-26 ENCOUNTER — Encounter: Payer: Self-pay | Admitting: Family Medicine

## 2021-08-26 ENCOUNTER — Other Ambulatory Visit: Payer: Self-pay

## 2021-08-26 VITALS — BP 134/89 | HR 104 | Wt 231.8 lb

## 2021-08-26 DIAGNOSIS — I10 Essential (primary) hypertension: Secondary | ICD-10-CM | POA: Diagnosis not present

## 2021-08-26 DIAGNOSIS — R0981 Nasal congestion: Secondary | ICD-10-CM | POA: Diagnosis not present

## 2021-08-26 DIAGNOSIS — M545 Low back pain, unspecified: Secondary | ICD-10-CM

## 2021-08-26 MED ORDER — IBUPROFEN 600 MG PO TABS: 600.0000 mg | ORAL_TABLET | Freq: Three times a day (TID) | ORAL | 0 refills | Status: DC | PRN

## 2021-08-26 MED ORDER — CYCLOBENZAPRINE HCL 5 MG PO TABS: 5.0000 mg | ORAL_TABLET | Freq: Three times a day (TID) | ORAL | 0 refills | Status: DC | PRN

## 2021-08-26 NOTE — Patient Instructions (Addendum)
It was great seeing you!  You were seen for follow up on your back pain medication.  I have refilled your Flexeril and ibuprofen.  For your elevated blood pressure, we will keep the amlodipine 5 mg for now since your repeat blood pressure came down, and I would like you to take some measurements at home in the morning and at night for about 3 days and bring them in for follow-up visit in about 2 to 3 weeks.  At that time we will also recheck your A1c.  For your congestion we discussed using your Flonase, and you can also use Mucinex DM to help with the cough.  Please schedule an appointment at the front desk for 2-3 weeks.   Feel free to call with any questions or concerns at any time, at [phone].   Take care,  Dr. Shary Key [facility]

## 2021-08-26 NOTE — Progress Notes (Signed)
° ° °  SUBJECTIVE:   CHIEF COMPLAINT / HPI:   Ms. [name] [name] is a 46 yo who presents to follow up on her medication. She reports she injured her lower back a few weeks ago and went to urgent care. States she has had spasms that initially occurred when she was trying to do laundry. Denies radiatoin of pain down leg. Denies loss of bladder or bowel function. Was prescribed Ibuprofen 600mg  and Cyclobenzaprin 5mg . She requests refills of those today.   Also endorses bad congestion for the past week. Has had some coughing. Sore throat resolved. Denies fever.   HTN: BP 169/96 on repeat 134/89 Currently on Amlodipine 5mg    PERTINENT  PMH / PSH:  Asthma, depression, HTN, IBS  OBJECTIVE:   BP 134/89    Pulse (!) 104    Wt 231 lb 12.8 oz (105.1 kg)    SpO2 99%    BMI 50.16 kg/m    Physical exam General: well appearing, NAD Cardiovascular: Tachycardic, no murmurs Lungs: CTAB. Normal WOB Abdomen: soft, non-distended, non-tender Skin: warm, dry. No edema MSK: lumbar area without erythema or edema. Mildly tender to palpation along the right paraspinal muscles. No radiation of pain.   ASSESSMENT/PLAN:   No problem-specific Assessment & Plan notes found for this encounter.   Back pain Endorses back spasms localized primarily on the right side a few weeks ago in which she was evaluated at urgent care and prescribed Flexeril and Ibuprofen 600mg . Currently without any red flag symptoms. Refilled her medication. Also recommended physical therapy but she states she already goes to PT.   Congestion For the past week. Denies fever, nausea, vomiting, change in Bms. Recommended using Flonase daily.   HTN:  BP 169/96 on repeat 134/89. Asymptomatic. Currently taking amlodipine 5mg . Given prior normotensive Bps, opted not to increase medication at this time. Advised her to take a few measurements at home and bring them in at follow up appontment and can decide to increase Amlodipine to 10mg  if BP elevation  persists    Follow up in 2-3 weeks to check BP, A1c, discuss mood  Shary Key, [place]

## 2021-09-02 ENCOUNTER — Ambulatory Visit: Payer: Medicaid Other

## 2021-09-10 ENCOUNTER — Other Ambulatory Visit: Payer: Self-pay | Admitting: Family Medicine

## 2021-09-10 DIAGNOSIS — R7303 Prediabetes: Secondary | ICD-10-CM

## 2021-09-16 ENCOUNTER — Ambulatory Visit: Payer: Medicaid Other | Admitting: Family Medicine

## 2021-09-16 NOTE — Progress Notes (Deleted)
° ° ° °  SUBJECTIVE:   CHIEF COMPLAINT / HPI:   [name] [name] is a 46 y.o. female presents for ***  Back Pain Onset: *** Location/Radiation: *** Duration: ***  Character: ***  Aggravating Factors: ***  Alleviating Factors: ***  Timing: *** Severity: ***  Denies *** history of trauma, history of prolonged steroid use, bowel or bladder incontinence, urinary retention, numbness or tingling in extremities or saddle anesthesia, weakness in extremities, fevers, chills, IV drug use, hemodialysis, hx cancer, changes in weight, night sweats. No history of osteoporosis ***.  No history of prostate cancer ***.   Occupation: ***No heavy lifting, repetitive vibrations, bending or twisting motions. ***  [place] Office Visit from 08/26/2021 in [place]  PHQ-9 Total Score 13        Health Maintenance Due  Topic   COVID-19 Vaccine (4 - Booster for Pf[employer] series)   MAMMOGRAM    TETANUS/TDAP    PAP SMEAR-Modifier       PERTINENT  PMH / PSH:   OBJECTIVE:   There were no vitals taken for this visit.   General: Alert, no acute distress Cardio: Normal S1 and S2, RRR, no r/m/g Pulm: CTAB, normal work of breathing Abdomen: Bowel sounds normal. Abdomen soft and non-tender.  Extremities: No peripheral edema.  Neuro: Cranial nerves grossly intact   Back Normal skin, Spine with normal alignment and no deformity.  No tenderness to vertebral process palpation.  Paraspinous muscles are not tender and without spasm.   Range of motion is full at neck and lumbar sacral regions. Negative straight leg test.  ASSESSMENT/PLAN:   No problem-specific Assessment & Plan notes found for this encounter.    Lattie Haw, MD PGY-3 [place]

## 2021-10-13 NOTE — Progress Notes (Deleted)
? ? ? ?  SUBJECTIVE:  ? ?CHIEF COMPLAINT / HPI:  ? ?[name] [name] is a 46 y.o. female presents for *** ? ? ?Diabetes ?Patient's current diabetic medications include***. Tolerating well without side effects.  Patient endorses compliance with these medications. CBG readings averaging in the *** range.  Patient's last A1c was  ?Lab Results  ?Component Value Date  ? HGBA1C 6.4 (H) 10/09/2020  ? HGBA1C 5.3 07/13/2017  ? HGBA1C 5.2 08/22/2015  ? on **.  Current A1c today is***.  Denies abdominal pain, blurred vision, polyuria, polydipsia, hypoglycemia ***. Patient states they understand that diet and exercise can help with her diabetes.***.   ? ?Last Microalbumin, LDL, Creatinine: ?Lab Results  ?Component Value Date  ? [place] 86 11/05/2020  ? CREATININE 0.63 10/09/2020  ? ? ?*** ? ?[place] Office Visit from 08/26/2021 in [place]  ?PHQ-9 Total Score 13  ? ?  ?  ? ?Health Maintenance Due  ?Topic  ? COVID-19 Vaccine (4 - Booster for [employer] series)  ? MAMMOGRAM   ? TETANUS/TDAP   ? PAP SMEAR-Modifier   ? ?  ? ?PERTINENT  PMH / PSH:  ? ?OBJECTIVE:  ? ?There were no vitals taken for this visit.  ? ?General: Alert, no acute distress ?Cardio: Normal S1 and S2, RRR, no r/m/g ?Pulm: CTAB, normal work of breathing ?Abdomen: Bowel sounds normal. Abdomen soft and non-tender.  ?Extremities: No peripheral edema.  ?Neuro: Cranial nerves grossly intact  ? ?ASSESSMENT/PLAN:  ? ?No problem-specific Assessment & Plan notes found for this encounter. ?  ? ?Lattie Haw, MD PGY-3 ?[place]  ?

## 2021-10-14 ENCOUNTER — Ambulatory Visit: Payer: Medicaid Other | Admitting: Family Medicine

## 2021-10-16 ENCOUNTER — Other Ambulatory Visit: Payer: Self-pay | Admitting: Family Medicine

## 2021-10-30 ENCOUNTER — Ambulatory Visit: Payer: Medicaid Other | Admitting: Family Medicine

## 2021-10-30 NOTE — Progress Notes (Deleted)
? ? ? ?  SUBJECTIVE:  ? ?CHIEF COMPLAINT / HPI:  ? ?[name] [name] is a 46 y.o. female presents for *** ? ?Prediabetes ?Patient's current diabetic medications include***. Tolerating well without side effects.  Patient endorses compliance with these medications. CBG readings averaging in the *** range.  Patient's last A1c was  ?Lab Results  ?Component Value Date  ? HGBA1C 6.4 (H) 10/09/2020  ? HGBA1C 5.3 07/13/2017  ? HGBA1C 5.2 08/22/2015  ? on **.  Current A1c today is***.  Denies abdominal pain, blurred vision, polyuria, polydipsia, hypoglycemia ***. Patient states they understand that diet and exercise can help with her diabetes.***.   ? ?Last Microalbumin, LDL, Creatinine: ?Lab Results  ?Component Value Date  ? [place] 86 11/05/2020  ? CREATININE 0.63 10/09/2020  ? ?*** ? ?[place] Office Visit from 08/26/2021 in [place]  ?PHQ-9 Total Score 13  ? ?  ?  ? ?Health Maintenance Due  ?Topic  ? COVID-19 Vaccine (4 - Booster for [employer] series)  ? MAMMOGRAM   ? TETANUS/TDAP   ? PAP SMEAR-Modifier   ? ?  ? ?PERTINENT  PMH / PSH:  ? ?OBJECTIVE:  ? ?There were no vitals taken for this visit.  ? ?General: Alert, no acute distress ?Cardio: Normal S1 and S2, RRR, no r/m/g ?Pulm: CTAB, normal work of breathing ?Abdomen: Bowel sounds normal. Abdomen soft and non-tender.  ?Extremities: No peripheral edema.  ?Neuro: Cranial nerves grossly intact  ? ?ASSESSMENT/PLAN:  ? ?No problem-specific Assessment & Plan notes found for this encounter. ?  ? ?Lattie Haw, MD PGY-3 ?[place]  ?

## 2021-11-12 ENCOUNTER — Other Ambulatory Visit: Payer: Self-pay | Admitting: Family Medicine

## 2021-11-12 ENCOUNTER — Ambulatory Visit (INDEPENDENT_AMBULATORY_CARE_PROVIDER_SITE_OTHER): Payer: Medicare HMO | Admitting: Family Medicine

## 2021-11-12 ENCOUNTER — Other Ambulatory Visit: Payer: Self-pay

## 2021-11-12 ENCOUNTER — Encounter: Payer: Self-pay | Admitting: Family Medicine

## 2021-11-12 VITALS — BP 148/107 | HR 110 | Wt 237.6 lb

## 2021-11-12 DIAGNOSIS — Z6841 Body Mass Index (BMI) 40.0 and over, adult: Secondary | ICD-10-CM | POA: Diagnosis not present

## 2021-11-12 DIAGNOSIS — J4520 Mild intermittent asthma, uncomplicated: Secondary | ICD-10-CM | POA: Diagnosis not present

## 2021-11-12 DIAGNOSIS — R7303 Prediabetes: Secondary | ICD-10-CM

## 2021-11-12 DIAGNOSIS — I10 Essential (primary) hypertension: Secondary | ICD-10-CM

## 2021-11-12 DIAGNOSIS — E119 Type 2 diabetes mellitus without complications: Secondary | ICD-10-CM | POA: Diagnosis not present

## 2021-11-12 DIAGNOSIS — E559 Vitamin D deficiency, unspecified: Secondary | ICD-10-CM

## 2021-11-12 LAB — POCT GLYCOSYLATED HEMOGLOBIN (HGB A1C): HbA1c, POC (controlled diabetic range): 6.8 % (ref 0.0–7.0)

## 2021-11-12 MED ORDER — OZEMPIC (0.25 OR 0.5 MG/DOSE) 2 MG/1.5ML ~~LOC~~ SOPN: PEN_INJECTOR | SUBCUTANEOUS | 2 refills | Status: DC

## 2021-11-12 MED ORDER — VALSARTAN 80 MG PO TABS: 80.0000 mg | ORAL_TABLET | Freq: Every day | ORAL | 0 refills | Status: DC

## 2021-11-12 NOTE — Patient Instructions (Addendum)
It was great to see you! ? ?Things we discussed at today's visit: ?- Your blood pressure was high today. Check your blood pressure at home every day. Write down the numbers and bring the written log to your next appointment. ?- STOP taking your amlodipine ?- Start taking Valsartan instead. Take 1 pill every night. ? ?-Your A1c has progressed from prediabetes to diabetes. Continue taking your Metformin.  ?- START taking Ozempic (injection) once per week. You may notice nausea or upset stomach at first. This should subside. ? ?We are checking some labs. We will discuss the results at your next visit or call if anything is concerning. ? ?Take care and seek immediate care sooner if you develop any concerns. ? ?Dr. Rock Nephew ?[facility]  ?

## 2021-11-12 NOTE — Progress Notes (Signed)
? ? ?  SUBJECTIVE:  ? ?CHIEF COMPLAINT / HPI:  ? ?Prediabetes ?Currently taking Metformin XR '500mg'$  daily ?Last A1c 6.4% in March 2022 ?Trying to walk more by parking far away ?Tries to watch diet but eats large amount of processed foods ? ?HTN ?Taking amlodipine '5mg'$  every night. Excellent compliance ?Checks BP at home regularly, states it's usually 010X systolic ? ?Wheezing ?Patient notices she's wheezing more often lately. Typically happens at night. Sometimes feels slightly short of breath with it. Not exertional, has been walking more and this does not cause dyspnea or wheezing. ?Taking her Breo daily and using albuterol prn which are helpful. Also using flonase daily. ?She states this usually happens in the Spring and Fall with the change of seasons. ? ?Vit D Deficiency ?Patient wondering if she needs to continue her vitamin D supplementation. If so, she needs refills. ? ?PERTINENT  PMH / PSH: obesity, fibromyalgia ? ?OBJECTIVE:  ? ?BP (!) 148/107   Pulse (!) 110   Wt 107.8 kg   SpO2 96%   BMI 51.42 kg/m?   ?Gen: obese, NAD ?CV: RRR, normal S1/S2 ?Resp: normal effort, lungs CTAB ?Ext: no edema ?Neuro: grossly intact ?Psych: appropriate mood and affect ? ?ASSESSMENT/PLAN:  ? ?Type 2 diabetes mellitus without complication, without long-term current use of insulin ([name] [name]) ?A1c 6.8% today, indicating she has progressed from prediabetes to diabetes. ?-Continue Metformin XR '500mg'$  daily ?-Start Ozempic 0.'25mg'$  weekly, increase to 0.'5mg'$  weekly after 4 weeks to help with both DM and obesity ?-Consider nutrition referral at next visit ?-Advised to schedule diabetic eye exam ?-Check BMP, lipids today ?-Not currently on statin, consider starting at next visit ?-Start ARB as below ? ?Hypertension ?Not well controlled. BP 163/101 today, repeat 148/107. She reports home readings of 323 systolic, but I question the accuracy of this. ?-Change from amlodipine '5mg'$  daily to Valsartan '80mg'$  daily (in light of newly diagnosed  diabetes) ?-Keep written log of home BP readings, bring to next appointment ?-Obtain BMP today ? ?Class 3 severe obesity with serious comorbidity and body mass index (BMI) of 50.0 to 59.9 in adult [facility]) ?BMI 51.42 with multiple comorbidities ?-Start Ozempic for DM and obesity as above ?-Consider bariatrics referral at future appointment ?-Consider sleep study for evaluation of OSA ? ?Vitamin D deficiency ?Last vit D level 10.7 in March 2022. ?-Obtain repeat vit D level today ?-Continue vit D supplementation pending results ? ?Asthma ?Worse recently in the setting of seasonal allergies.  ?-Continue current regimen (Breo daily, Albuterol prn) ?-Continue Flonase for allergies ?-ED/return precautions reviewed ?  ? ?Alcus Dad, MD ?[place]  ?

## 2021-11-13 DIAGNOSIS — E559 Vitamin D deficiency, unspecified: Secondary | ICD-10-CM | POA: Insufficient documentation

## 2021-11-13 DIAGNOSIS — E119 Type 2 diabetes mellitus without complications: Secondary | ICD-10-CM | POA: Insufficient documentation

## 2021-11-13 LAB — LIPID PANEL
Chol/HDL Ratio: 3.2 ratio (ref 0.0–4.4)
Cholesterol, Total: 185 mg/dL (ref 100–199)
HDL: 58 mg/dL (ref >39)
LDL Chol Calc (NIH): 92 mg/dL (ref 0–99)
Triglycerides: 209 mg/dL — ABNORMAL HIGH (ref 0–149)
VLDL Cholesterol Cal: 35 mg/dL (ref 5–40)

## 2021-11-13 LAB — VITAMIN D 25 HYDROXY (VIT D DEFICIENCY, FRACTURES): Vit D, 25-Hydroxy: 15.6 ng/mL — ABNORMAL LOW (ref 30.0–100.0)

## 2021-11-13 LAB — BASIC METABOLIC PANEL
BUN/Creatinine Ratio: 10 (ref 9–23)
BUN: 7 mg/dL (ref 6–24)
CO2: 25 mmol/L (ref 20–29)
Calcium: 9.9 mg/dL (ref 8.7–10.2)
Chloride: 96 mmol/L (ref 96–106)
Creatinine, Ser: 0.68 mg/dL (ref 0.57–1.00)
Glucose: 110 mg/dL — ABNORMAL HIGH (ref 70–99)
Potassium: 3.9 mmol/L (ref 3.5–5.2)
Sodium: 137 mmol/L (ref 134–144)
eGFR: 111 mL/min/{1.73_m2} (ref >59)

## 2021-11-13 NOTE — Assessment & Plan Note (Signed)
BMI 51.42 with multiple comorbidities ?-Start Ozempic for DM and obesity as above ?-Consider bariatrics referral at future appointment ?

## 2021-11-13 NOTE — Assessment & Plan Note (Signed)
A1c 6.8% today, indicating she has progressed from prediabetes to diabetes. ?-Continue Metformin XR '500mg'$  daily ?-Start Ozempic 0.'25mg'$  weekly, increase to 0.'5mg'$  weekly after 4 weeks to help with both DM and obesity ?-Consider nutrition referral at next visit ?-Advised to schedule diabetic eye exam ?-Check BMP, lipids today ?-Not currently on statin, consider starting at next visit ?-Start ARB as below ?

## 2021-11-13 NOTE — Assessment & Plan Note (Signed)
Last vit D level 10.7 in March 2022. ?-Obtain repeat vit D level today ?-Continue vit D supplementation pending results ?

## 2021-11-13 NOTE — Assessment & Plan Note (Signed)
Worse recently in the setting of seasonal allergies. ?-Continue current regimen (Breo daily, Albuterol prn) ?-Continue Flonase for allergies ?-ED/return precautions reviewed ?

## 2021-11-13 NOTE — Assessment & Plan Note (Signed)
Not well controlled. BP 163/101 today, repeat 148/107. She reports home readings of 552 systolic, but I question the accuracy of this. ?-Change from amlodipine '5mg'$  daily to Valsartan '80mg'$  daily (in light of newly diagnosed diabetes) ?-Keep written log of home BP readings, bring to next appointment ?-Obtain BMP today ?

## 2021-11-14 ENCOUNTER — Other Ambulatory Visit: Payer: Self-pay | Admitting: Family Medicine

## 2021-11-14 DIAGNOSIS — I10 Essential (primary) hypertension: Secondary | ICD-10-CM

## 2021-11-14 DIAGNOSIS — E119 Type 2 diabetes mellitus without complications: Secondary | ICD-10-CM

## 2021-12-02 ENCOUNTER — Telehealth: Payer: Self-pay

## 2021-12-02 ENCOUNTER — Ambulatory Visit: Payer: Medicare HMO | Admitting: Family Medicine

## 2021-12-02 NOTE — Telephone Encounter (Signed)
Patient calls nurse line requesting a medication for nausea.  ? ?Patient reports when she injects Ozempic she is nauseated for a few days after.  ? ?Will forward to PCP.  ?

## 2021-12-03 NOTE — Telephone Encounter (Signed)
Patient has appointment with me on 5/16. Will discuss at that time. Should not need anything before then since this is a once weekly medication. ?

## 2021-12-10 ENCOUNTER — Encounter: Payer: Self-pay | Admitting: Family Medicine

## 2021-12-10 ENCOUNTER — Ambulatory Visit (INDEPENDENT_AMBULATORY_CARE_PROVIDER_SITE_OTHER): Payer: Medicare HMO | Admitting: Family Medicine

## 2021-12-10 VITALS — BP 142/90 | HR 113 | Wt 234.0 lb

## 2021-12-10 DIAGNOSIS — E785 Hyperlipidemia, unspecified: Secondary | ICD-10-CM

## 2021-12-10 DIAGNOSIS — E119 Type 2 diabetes mellitus without complications: Secondary | ICD-10-CM | POA: Diagnosis not present

## 2021-12-10 DIAGNOSIS — I10 Essential (primary) hypertension: Secondary | ICD-10-CM | POA: Diagnosis not present

## 2021-12-10 DIAGNOSIS — E559 Vitamin D deficiency, unspecified: Secondary | ICD-10-CM | POA: Diagnosis not present

## 2021-12-10 MED ORDER — VALSARTAN 160 MG PO TABS: 160.0000 mg | ORAL_TABLET | Freq: Every day | ORAL | 0 refills | Status: DC

## 2021-12-10 MED ORDER — VITAMIN D 25 MCG (1000 UNIT) PO TABS: 1000.0000 [IU] | ORAL_TABLET | Freq: Every day | ORAL | 3 refills | Status: DC

## 2021-12-10 MED ORDER — ROSUVASTATIN CALCIUM 5 MG PO TABS: 5.0000 mg | ORAL_TABLET | Freq: Every day | ORAL | 3 refills | Status: DC

## 2021-12-10 NOTE — Patient Instructions (Addendum)
It was great to see you! ? ?-We are starting a medication called rosuvastatin (crestor). Take 1 pill daily. This is a medication that all people with diabetes who are above age 44 should take to help reduce risk of heart disease and stroke. ?-Start taking 1000 IU of vitamin D daily. I sent a prescription to your pharmacy. ?-Continue your Ozempic at the 0.'25mg'$  weekly dose. If your nausea continues to get worse, let me know. ?-We are going to increase the dose of your blood pressure medication (Valsartan). Continue checking your blood pressure at home and bring a written log of the readings to your next appointment.  ? ?-Please have your feet examined at the podiatrist. You can call [facility] for an appointment. [phone] ? ? ?Take care and seek immediate care sooner if you develop any concerns. ? ?Dr. Rock Nephew ?[facility]  ?

## 2021-12-10 NOTE — Assessment & Plan Note (Addendum)
BP remains above goal. 159/104 today with repeat measurement 142/90. Majority of home readings elevated as well (209O systolic). ?-Increase Valsartan to '160mg'$  daily ?-Continue home BP monitoring ?-Recommend weight loss, hopefully Ozempic will help with this ?-Return in 6 weeks for f/u ?

## 2021-12-10 NOTE — Progress Notes (Signed)
? ? ?  SUBJECTIVE:  ? ?CHIEF COMPLAINT / HPI:  ? ?HTN follow up ?BP above goal at last visit-- she was switched from amlodipine '5mg'$  daily to Valsartan '80mg'$  daily in light of recent diabetes diagnosis. ?Home readings since her last visit have been 150s/80-100s. Checking BP every other day at home. ?She reports excellent medication compliance, hasn't missed any doses ? ?DM follow up ?Diabetes diagnosed at last visit (progressed from known prediabetes) ?Current meds: Metformin XR '500mg'$  daily, Ozempic 0.'25mg'$  ?Reports excellent medication compliance ?Some nausea with the Ozempic (vomited 3x the morning after her last dose). Otherwise tolerating medications ok ?Does not check sugars at home ?Due for foot and eye exam ?Not on statin ? ? ?PERTINENT  PMH / PSH: obesity, sinus tachycardia, asthma, cerebral palsy ? ?OBJECTIVE:  ? ?BP (!) 142/90   Pulse (!) 113   Wt 234 lb (106.1 kg)   BMI 50.64 kg/m?   ?General: obese, NAD, pleasant, able to participate in exam ?Respiratory: No respiratory distress ?Skin: warm and dry, no rashes noted ?Psych: Normal affect and mood ?Neuro: grossly intact ? ?ASSESSMENT/PLAN:  ? ?Hypertension ?BP remains above goal. 159/104 today with repeat measurement 142/90. Majority of home readings elevated as well (269S systolic). ?-Increase Valsartan to '160mg'$  daily ?-Continue home BP monitoring ?-Recommend weight loss, hopefully Ozempic will help with this ?-Return in 6 weeks for f/u ? ?Type 2 diabetes mellitus without complication, without long-term current use of insulin ([place]) ?Newly diagnosed 1 month ago- A1c 6.8% at that time. ?-Continue Metformin XR '500mg'$  daily ?-Continue Ozempic 0.'25mg'$  weekly. Hopeful nausea will subside with ongoing use.  ?-Start Rosuvastatin '5mg'$  daily ?-Patient has eye exam scheduled ?-Given phone number for podiatry for foot exam ? ? ?Vitamin D deficiency ?Vit D level checked at last visit 1 month ago due to hx of vit D deficiency. It was low at 15.6. ?-Vit D3 1000 IU  daily ? ? ?Of note, patient taking multiple medications that are not safe in pregnancy. She is abstinent, but I reminded her to let me know immediately if she becomes sexually active so we can ensure reliable form of birth control. ? ?Alcus Dad, MD ?[place]  ?

## 2021-12-10 NOTE — Assessment & Plan Note (Signed)
Newly diagnosed 1 month ago- A1c 6.8% at that time. ?-Continue Metformin XR '500mg'$  daily ?-Continue Ozempic 0.'25mg'$  weekly. Hopeful nausea will subside with ongoing use.  ?-Start Rosuvastatin '5mg'$  daily ?-Patient has eye exam scheduled ?-Given phone number for podiatry for foot exam ? ?

## 2021-12-10 NOTE — Assessment & Plan Note (Signed)
Vit D level checked at last visit 1 month ago due to hx of vit D deficiency. It was low at 15.6. ?-Vit D3 1000 IU daily ?

## 2021-12-11 ENCOUNTER — Other Ambulatory Visit: Payer: Self-pay | Admitting: Family Medicine

## 2022-01-01 ENCOUNTER — Other Ambulatory Visit: Payer: Self-pay | Admitting: Family Medicine

## 2022-01-01 DIAGNOSIS — E119 Type 2 diabetes mellitus without complications: Secondary | ICD-10-CM

## 2022-01-07 ENCOUNTER — Other Ambulatory Visit: Payer: Self-pay | Admitting: Family Medicine

## 2022-01-07 DIAGNOSIS — M797 Fibromyalgia: Secondary | ICD-10-CM

## 2022-01-07 DIAGNOSIS — E119 Type 2 diabetes mellitus without complications: Secondary | ICD-10-CM

## 2022-01-07 DIAGNOSIS — I10 Essential (primary) hypertension: Secondary | ICD-10-CM

## 2022-01-18 NOTE — Progress Notes (Signed)
    SUBJECTIVE:   CHIEF COMPLAINT / HPI:   HTN follow up Last seen 12/10/21, blood pressure was elevated to 159/104. Valsartan increased to '160mg'$  daily at that time. Not checking BP at home regularly. Checked it once, maybe twice since last visit- reading was 153/97 Med compliance is excellent- has not missed any doses over past 1 month.  Obesity Tolerating 0.'25mg'$  dose of Ozempic without adverse effects. Reports it has definitely suppressed her appetite but she hasn't lost any weight yet. Would like to increase to next dose.  PERTINENT  PMH / PSH: obesity, T2DM, sinus tachycardia, asthma, cerebral palsy  OBJECTIVE:   BP 135/75   Pulse (!) 114   Wt 234 lb 9.6 oz (106.4 kg)   SpO2 99%   BMI 50.77 kg/m   General: NAD, pleasant, able to participate in exam Respiratory: No respiratory distress Skin: warm and dry, no rashes noted Psych: Normal affect and mood Neuro: grossly intact  ASSESSMENT/PLAN:   Hypertension Poorly controlled. BP elevated x2 in the office today -Continue Valsartan '160mg'$  daily -Restart Amlodipine '5mg'$  daily -When she runs out of medication, will send refill for combo pill Exforge 5-160 -Obtain BMP today -Encouraged to check BP at home -f/u in 4 weeks -If persistently elevated, consider beta blocker to also help with her chronic sinus tachycardia   Class 3 severe obesity with serious comorbidity and body mass index (BMI) of 50.0 to 59.9 in adult ([place]) Weight unchanged from last visit. BMI 50 with multiple comorbidities. -Increase Ozempic to 0.'5mg'$  weekly  Health Maintenance Patient overdue for multiple health maintenance items including pap smear, diabetic eye exam, foot exam. Encouraged her to have these done. She will schedule appts with her OBGYN, eye doctor, and podiatrist.   Alcus Dad, MD [place]

## 2022-01-21 ENCOUNTER — Encounter: Payer: Self-pay | Admitting: Family Medicine

## 2022-01-21 ENCOUNTER — Ambulatory Visit (INDEPENDENT_AMBULATORY_CARE_PROVIDER_SITE_OTHER): Payer: Medicare HMO | Admitting: Family Medicine

## 2022-01-21 VITALS — BP 135/75 | HR 114 | Wt 234.6 lb

## 2022-01-21 DIAGNOSIS — Z6841 Body Mass Index (BMI) 40.0 and over, adult: Secondary | ICD-10-CM | POA: Diagnosis not present

## 2022-01-21 DIAGNOSIS — E119 Type 2 diabetes mellitus without complications: Secondary | ICD-10-CM

## 2022-01-21 DIAGNOSIS — I10 Essential (primary) hypertension: Secondary | ICD-10-CM

## 2022-01-21 MED ORDER — OZEMPIC (0.25 OR 0.5 MG/DOSE) 2 MG/3ML ~~LOC~~ SOPN: 0.5000 mg | PEN_INJECTOR | SUBCUTANEOUS | 2 refills | Status: DC

## 2022-01-22 ENCOUNTER — Encounter: Payer: Self-pay | Admitting: Family Medicine

## 2022-01-22 LAB — BASIC METABOLIC PANEL
BUN/Creatinine Ratio: 14 (ref 9–23)
BUN: 10 mg/dL (ref 6–24)
CO2: 21 mmol/L (ref 20–29)
Calcium: 9.7 mg/dL (ref 8.7–10.2)
Chloride: 100 mmol/L (ref 96–106)
Creatinine, Ser: 0.74 mg/dL (ref 0.57–1.00)
Glucose: 133 mg/dL — ABNORMAL HIGH (ref 70–99)
Potassium: 3.8 mmol/L (ref 3.5–5.2)
Sodium: 140 mmol/L (ref 134–144)
eGFR: 103 mL/min/{1.73_m2} (ref >59)

## 2022-01-22 NOTE — Assessment & Plan Note (Signed)
Poorly controlled. BP elevated x2 in the office today -Continue Valsartan '160mg'$  daily -Restart Amlodipine '5mg'$  daily -When she runs out of medication, will send refill for combo pill Exforge 5-160 -Obtain BMP today -Encouraged to check BP at home -f/u in 4 weeks -If persistently elevated, consider beta blocker to also help with her chronic sinus tachycardia

## 2022-01-22 NOTE — Assessment & Plan Note (Addendum)
Weight unchanged from last visit. BMI 50 with multiple comorbidities. -Increase Ozempic to 0.'5mg'$  weekly

## 2022-01-23 ENCOUNTER — Encounter: Payer: Self-pay | Admitting: Family Medicine

## 2022-01-26 ENCOUNTER — Other Ambulatory Visit: Payer: Self-pay | Admitting: Family Medicine

## 2022-01-26 DIAGNOSIS — M797 Fibromyalgia: Secondary | ICD-10-CM

## 2022-01-26 DIAGNOSIS — M791 Myalgia, unspecified site: Secondary | ICD-10-CM

## 2022-02-13 ENCOUNTER — Other Ambulatory Visit: Payer: Self-pay | Admitting: Family Medicine

## 2022-02-26 DIAGNOSIS — E119 Type 2 diabetes mellitus without complications: Secondary | ICD-10-CM | POA: Diagnosis not present

## 2022-02-26 DIAGNOSIS — Z0100 Encounter for examination of eyes and vision without abnormal findings: Secondary | ICD-10-CM | POA: Diagnosis not present

## 2022-02-26 DIAGNOSIS — H5210 Myopia, unspecified eye: Secondary | ICD-10-CM | POA: Diagnosis not present

## 2022-03-04 ENCOUNTER — Ambulatory Visit: Payer: Medicare HMO | Admitting: Family Medicine

## 2022-03-05 ENCOUNTER — Other Ambulatory Visit: Payer: Self-pay | Admitting: Family Medicine

## 2022-03-05 DIAGNOSIS — I10 Essential (primary) hypertension: Secondary | ICD-10-CM

## 2022-04-03 ENCOUNTER — Ambulatory Visit: Payer: Medicare HMO | Admitting: Family Medicine

## 2022-04-17 ENCOUNTER — Other Ambulatory Visit: Payer: Self-pay | Admitting: Family Medicine

## 2022-04-18 ENCOUNTER — Other Ambulatory Visit: Payer: Self-pay | Admitting: Family Medicine

## 2022-04-29 ENCOUNTER — Ambulatory Visit: Payer: Medicare HMO | Admitting: Family Medicine

## 2022-04-29 NOTE — Progress Notes (Deleted)
    SUBJECTIVE:   CHIEF COMPLAINT / HPI:   ***  PERTINENT  PMH / PSH: ***  OBJECTIVE:   There were no vitals taken for this visit.  ***  ASSESSMENT/PLAN:   No problem-specific Assessment & Plan notes found for this encounter.     Alcus Dad, MD [place]

## 2022-05-04 ENCOUNTER — Other Ambulatory Visit: Payer: Self-pay | Admitting: Family Medicine

## 2022-05-22 ENCOUNTER — Other Ambulatory Visit: Payer: Self-pay | Admitting: Family Medicine

## 2022-05-22 DIAGNOSIS — E119 Type 2 diabetes mellitus without complications: Secondary | ICD-10-CM

## 2022-05-22 DIAGNOSIS — I10 Essential (primary) hypertension: Secondary | ICD-10-CM

## 2022-06-05 ENCOUNTER — Other Ambulatory Visit: Payer: Self-pay | Admitting: Family Medicine

## 2022-06-05 DIAGNOSIS — E119 Type 2 diabetes mellitus without complications: Secondary | ICD-10-CM

## 2022-06-05 DIAGNOSIS — I10 Essential (primary) hypertension: Secondary | ICD-10-CM

## 2022-06-10 ENCOUNTER — Other Ambulatory Visit: Payer: Self-pay | Admitting: Family Medicine

## 2022-06-10 DIAGNOSIS — I10 Essential (primary) hypertension: Secondary | ICD-10-CM

## 2022-06-10 DIAGNOSIS — E119 Type 2 diabetes mellitus without complications: Secondary | ICD-10-CM

## 2022-06-11 NOTE — Telephone Encounter (Signed)
Refill not appropriate- dose changed. Refill already sent on valsartan '160mg'$ .

## 2022-06-16 ENCOUNTER — Other Ambulatory Visit: Payer: Self-pay | Admitting: Family Medicine

## 2022-07-15 ENCOUNTER — Other Ambulatory Visit: Payer: Self-pay | Admitting: Family Medicine

## 2022-07-15 DIAGNOSIS — E119 Type 2 diabetes mellitus without complications: Secondary | ICD-10-CM

## 2022-08-01 ENCOUNTER — Other Ambulatory Visit: Payer: Self-pay | Admitting: Family Medicine

## 2022-08-01 DIAGNOSIS — I10 Essential (primary) hypertension: Secondary | ICD-10-CM

## 2022-08-01 DIAGNOSIS — E119 Type 2 diabetes mellitus without complications: Secondary | ICD-10-CM

## 2022-08-05 ENCOUNTER — Ambulatory Visit: Payer: Medicare HMO | Admitting: Family Medicine

## 2022-08-05 NOTE — Progress Notes (Deleted)
    SUBJECTIVE:   CHIEF COMPLAINT / HPI:   Hypertension: Patient is a 47 y.o. female who presents today for HTN follow-up.  Home medications include: valsartan '160mg'$  daily and amlodipine '5mg'$  daily Patient reports *** compliance. Inadequate compliance with amlodipine as it has not been filled recently.  Patient {rwdoesdoesnot:[id number]} check blood pressure at home.  Patient has had a BMP in the past 1 year.   PERTINENT  PMH / PSH: ***  OBJECTIVE:   There were no vitals taken for this visit.  ***  ASSESSMENT/PLAN:   No problem-specific Assessment & Plan notes found for this encounter.     Alcus Dad, MD [place]

## 2022-08-08 ENCOUNTER — Other Ambulatory Visit: Payer: Self-pay | Admitting: Family Medicine

## 2022-08-08 DIAGNOSIS — I10 Essential (primary) hypertension: Secondary | ICD-10-CM

## 2022-08-15 ENCOUNTER — Ambulatory Visit: Payer: Medicare HMO | Admitting: Family Medicine

## 2022-08-15 NOTE — Progress Notes (Deleted)
    SUBJECTIVE:   CHIEF COMPLAINT / HPI:   Hypertension: Patient is a 47 y.o. female who presents today for HTN follow-up. Last seen 01/21/22  Home medications include: valsartan '160mg'$  daily, amlodipine '5mg'$  daily (which was added at last visit).  Has no-showed/re-scheduled several appointments since that time. Patient reports *** compliance.  Patient {rwdoesdoesnot:[id number]} check blood pressure at home.  Patient {HAS HAS [id number]} had a BMP in the past 1 year.   PERTINENT  PMH / PSH: ***  OBJECTIVE:   There were no vitals taken for this visit.  ***  ASSESSMENT/PLAN:   No problem-specific Assessment & Plan notes found for this encounter.     Alcus Dad, MD [place]

## 2022-09-07 ENCOUNTER — Other Ambulatory Visit: Payer: Self-pay | Admitting: Family Medicine

## 2022-09-15 ENCOUNTER — Telehealth: Payer: Self-pay | Admitting: Family Medicine

## 2022-09-23 ENCOUNTER — Other Ambulatory Visit: Payer: Self-pay | Admitting: Family Medicine

## 2022-09-23 DIAGNOSIS — R7303 Prediabetes: Secondary | ICD-10-CM

## 2022-09-23 MED ORDER — ALBUTEROL SULFATE HFA 108 (90 BASE) MCG/ACT IN AERS: INHALATION_SPRAY | RESPIRATORY_TRACT | 1 refills | Status: DC

## 2022-09-23 NOTE — Telephone Encounter (Signed)
Patient calls nurse line in regards to Albuterol.  Apt made for next week with PCP.   Patient asks you send in a refill in the meantime.

## 2022-09-23 NOTE — Telephone Encounter (Signed)
Refill sent as requested. 

## 2022-09-23 NOTE — Addendum Note (Signed)
Addended by: Alcus Dad on: 09/23/2022 02:28 PM   Modules accepted: Orders

## 2022-09-25 ENCOUNTER — Other Ambulatory Visit: Payer: Self-pay | Admitting: Family Medicine

## 2022-09-30 ENCOUNTER — Encounter: Payer: Self-pay | Admitting: Family Medicine

## 2022-09-30 ENCOUNTER — Ambulatory Visit (INDEPENDENT_AMBULATORY_CARE_PROVIDER_SITE_OTHER): Payer: Medicare HMO | Admitting: Family Medicine

## 2022-09-30 VITALS — BP 132/74 | HR 109 | Ht <= 58 in | Wt 229.0 lb

## 2022-09-30 DIAGNOSIS — I10 Essential (primary) hypertension: Secondary | ICD-10-CM | POA: Diagnosis not present

## 2022-09-30 DIAGNOSIS — Z6841 Body Mass Index (BMI) 40.0 and over, adult: Secondary | ICD-10-CM | POA: Diagnosis not present

## 2022-09-30 DIAGNOSIS — R000 Tachycardia, unspecified: Secondary | ICD-10-CM

## 2022-09-30 DIAGNOSIS — E119 Type 2 diabetes mellitus without complications: Secondary | ICD-10-CM

## 2022-09-30 DIAGNOSIS — M797 Fibromyalgia: Secondary | ICD-10-CM

## 2022-09-30 LAB — POCT GLYCOSYLATED HEMOGLOBIN (HGB A1C): HbA1c, POC (controlled diabetic range): 6.9 % (ref 0.0–7.0)

## 2022-09-30 MED ORDER — SEMAGLUTIDE (1 MG/DOSE) 4 MG/3ML ~~LOC~~ SOPN: 1.0000 mg | PEN_INJECTOR | SUBCUTANEOUS | 2 refills | Status: DC

## 2022-09-30 MED ORDER — AMLODIPINE BESYLATE-VALSARTAN 10-160 MG PO TABS: 1.0000 | ORAL_TABLET | Freq: Every day | ORAL | 0 refills | Status: DC

## 2022-09-30 MED ORDER — GABAPENTIN 400 MG PO CAPS: 800.0000 mg | ORAL_CAPSULE | Freq: Three times a day (TID) | ORAL | 2 refills | Status: DC

## 2022-09-30 NOTE — Assessment & Plan Note (Addendum)
Well-controlled. A1c 6.9%. -Continue Metformin XR '500mg'$  daily -Increase Ozempic to 1 mg weekly -Check urine microalbumin and updated CMP today -Continue ARB, statin -UTD on eye exam per patient report, she will ask eye doctor to fax notes to our office -Due for foot exam.  Patient prefers to see podiatrist for this -Next A1c in 6 months

## 2022-09-30 NOTE — Progress Notes (Signed)
    SUBJECTIVE:   CHIEF COMPLAINT / HPI:   Type 2 Diabetes Home medications include: Metformin '500mg'$  daily, Ozempic 0.'5mg'$  weekly Patient reports excellent medication compliance. Patient does not check sugar at home. No hypoglycemic episodes/symptoms.  Most recent A1Cs:  Lab Results  Component Value Date   HGBA1C 6.9 09/30/2022   HGBA1C 6.8 11/12/2021   HGBA1C 6.4 (H) 10/09/2020   Last Microalbumin, LDL, Creatinine: Lab Results  Component Value Date   LDLCALC 92 11/12/2021   CREATININE 0.74 01/21/2022   Patient states she is up to date on diabetic eye- she will ask her eye doctor to fax notes Patient is not up to date on diabetic foot exam- prefers to see a podiatrist for this  Hypertension Home medications include: amlodipine '5mg'$  daily, valsartan '160mg'$  daily Patient reports excellent compliance. Patient does not check blood pressure at home.  Fibromyalgia Patient with worse pain in her bilateral upper and lower extremities recently.  Feels like her fibromyalgia pain. Described as aching and cramping. No injury.  No weakness  PERTINENT  PMH / PSH: cerebral palsy  OBJECTIVE:   BP 132/74   Pulse (!) 109   Ht '4\' 8"'$  (1.422 m)   Wt 229 lb (103.9 kg)   LMP 09/04/2022   SpO2 96%   BMI 51.34 kg/m   Gen: NAD, able to participate in exam CV: mildly tachycardic, normal S1/S2, no murmur Resp: Normal effort, lungs CTAB Extremities: no edema or cyanosis, mild generalized tenderness in bilateral proximal upper extremities and throughout bilateral lower extremities Skin: warm and dry Neuro: alert, no obvious focal deficits Psych: Normal affect and mood  ASSESSMENT/PLAN:   Type 2 diabetes mellitus without complication, without long-term current use of insulin ([place]) Well-controlled. A1c 6.9%. -Continue Metformin XR '500mg'$  daily -Increase Ozempic to 1 mg weekly -Check urine microalbumin and updated CMP today -Continue ARB, statin -UTD on eye exam per patient report, she  will ask eye doctor to fax notes to our office -Due for foot exam.  Patient prefers to see podiatrist for this -Next A1c in 6 months  Hypertension BP remains slightly above goal. -Continue valsartan '160mg'$  daily -Increase amlodipine from '5mg'$  to '10mg'$  daily -Will send Rx for combination pill amlodipine-valsartan (exforge) 10-'160mg'$  -Check CMP today  Class 3 severe obesity with serious comorbidity and body mass index (BMI) of 50.0 to 59.9 in adult ([place]) BMI 51 with multiple medical comorbidities. We had extensive discussion today regarding the need for weight loss. Increase Ozempic to '1mg'$  weekly as above. Patient to work on lifestyle modifications as well. Emphasized that exercise will help with her fibromyalgia pain and weight loss can help with her blood pressure, diabetes, and tachycardia. Follow up in 1 month.  Sinus tachycardia Likely secondary to profound deconditioning. Advised weight loss as above. Discussed adding beta blocker but will give patient a chance to attempt weight loss first and see if her HR improves at all.  Fibromyalgia Currently flaring. -Increase Gabapentin from '800mg'$  BID to TID -Continue Amitriptyline '50mg'$  daily and Cymbalta '60mg'$  daily -Encouraged regular exercise    Alcus Dad, MD [place]

## 2022-09-30 NOTE — Assessment & Plan Note (Signed)
BP remains slightly above goal. -Continue valsartan '160mg'$  daily -Increase amlodipine from '5mg'$  to '10mg'$  daily -Will send Rx for combination pill amlodipine-valsartan (exforge) 10-'160mg'$  -Check CMP today

## 2022-09-30 NOTE — Assessment & Plan Note (Signed)
Likely secondary to profound deconditioning. Advised weight loss as above. Discussed adding beta blocker but will give patient a chance to attempt weight loss first and see if her HR improves at all.

## 2022-09-30 NOTE — Assessment & Plan Note (Signed)
Currently flaring. -Increase Gabapentin from '800mg'$  BID to TID -Continue Amitriptyline '50mg'$  daily and Cymbalta '60mg'$  daily -Encouraged regular exercise

## 2022-09-30 NOTE — Patient Instructions (Addendum)
Your A1c was 6.9% today, which means your diabetes is adequately controlled. Your goal A1c is less than 7%. Continue your Metformin and we will increase your Ozempic to '1mg'$  weekly. I have sent a new prescription.  Ask your eye doctor to send me a copy of your diabetic eye exam. This should be done once yearly. Our fax is [phone].  Be sure the podiatrist does a diabetic foot exam and sends the visit notes to our office.  For your blood pressure: I have sent a combination pill IN PLACE OF your amlodipine and valsartan.  We are checking some blood work and urine studies today. I will send you a letter with the results or call if needed.  The single most important thing you can do for your health is to work on weight loss. The increased dose of Ozempic should help with this but you need to do some work on your end as well. Aim for 150 minutes of exercise per week but remember some is better than none! This will help with your blood pressure, heart rate, joint pain, and fibromyalgia pain.  Follow up with me in 1 month to monitor your weight loss progress.  Take care, Dr Rock Nephew

## 2022-09-30 NOTE — Assessment & Plan Note (Signed)
BMI 51 with multiple medical comorbidities. We had extensive discussion today regarding the need for weight loss. Increase Ozempic to '1mg'$  weekly as above. Patient to work on lifestyle modifications as well. Emphasized that exercise will help with her fibromyalgia pain and weight loss can help with her blood pressure, diabetes, and tachycardia. Follow up in 1 month.

## 2022-10-01 LAB — COMPREHENSIVE METABOLIC PANEL
ALT: 12 IU/L (ref 0–32)
AST: 13 IU/L (ref 0–40)
Albumin/Globulin Ratio: 1.6 (ref 1.2–2.2)
Albumin: 4.1 g/dL (ref 3.9–4.9)
Alkaline Phosphatase: 87 IU/L (ref 44–121)
BUN/Creatinine Ratio: 14 (ref 9–23)
BUN: 8 mg/dL (ref 6–24)
Bilirubin Total: <0.2 mg/dL (ref 0.0–1.2)
CO2: 25 mmol/L (ref 20–29)
Calcium: 9.6 mg/dL (ref 8.7–10.2)
Chloride: 99 mmol/L (ref 96–106)
Creatinine, Ser: 0.59 mg/dL (ref 0.57–1.00)
Globulin, Total: 2.5 g/dL (ref 1.5–4.5)
Glucose: 152 mg/dL — ABNORMAL HIGH (ref 70–99)
Potassium: 3.6 mmol/L (ref 3.5–5.2)
Sodium: 138 mmol/L (ref 134–144)
Total Protein: 6.6 g/dL (ref 6.0–8.5)
eGFR: 114 mL/min/{1.73_m2} (ref >59)

## 2022-10-01 LAB — MICROALBUMIN / CREATININE URINE RATIO
Creatinine, Urine: 177.8 mg/dL
Microalb/Creat Ratio: 7 mg/g creat (ref 0–29)
Microalbumin, Urine: 12 ug/mL

## 2022-10-02 ENCOUNTER — Encounter: Payer: Self-pay | Admitting: Family Medicine

## 2022-10-13 ENCOUNTER — Ambulatory Visit (INDEPENDENT_AMBULATORY_CARE_PROVIDER_SITE_OTHER): Payer: Medicare HMO | Admitting: Podiatry

## 2022-10-13 DIAGNOSIS — E119 Type 2 diabetes mellitus without complications: Secondary | ICD-10-CM

## 2022-10-13 NOTE — Progress Notes (Signed)
Patient left without being seen.

## 2022-10-18 ENCOUNTER — Other Ambulatory Visit: Payer: Self-pay | Admitting: Family Medicine

## 2022-10-21 LAB — HM DIABETES EYE EXAM

## 2022-11-05 ENCOUNTER — Ambulatory Visit (INDEPENDENT_AMBULATORY_CARE_PROVIDER_SITE_OTHER): Payer: Medicare HMO | Admitting: Family Medicine

## 2022-11-05 ENCOUNTER — Encounter: Payer: Self-pay | Admitting: Family Medicine

## 2022-11-05 VITALS — BP 138/80 | HR 111 | Ht <= 58 in | Wt 228.0 lb

## 2022-11-05 DIAGNOSIS — E119 Type 2 diabetes mellitus without complications: Secondary | ICD-10-CM | POA: Diagnosis not present

## 2022-11-05 DIAGNOSIS — Z6841 Body Mass Index (BMI) 40.0 and over, adult: Secondary | ICD-10-CM | POA: Diagnosis not present

## 2022-11-05 DIAGNOSIS — R000 Tachycardia, unspecified: Secondary | ICD-10-CM | POA: Diagnosis not present

## 2022-11-05 NOTE — Progress Notes (Unsigned)
    SUBJECTIVE:   CHIEF COMPLAINT / HPI:   Patient here for diabetic foot exam. Had an appointment with podiatry but left before being seen due to excessive wait time. Decided to come to PCP office instead. Denies concerns related to her feet- no skin changes or neuropathy symptoms.  Obesity f/u At her last visit 1 month ago we discussed the importance of weight loss at length. We increased her Ozempic to 1mg  weekly at that time. Today reports she continues to work on her weight-- trying to be more active. Has lost 1lb. Has never had sleep study. She does snore and have daytime sleepiness.  PERTINENT  PMH / PSH: HTN, fibromyalgia, cerebral palsy  OBJECTIVE:   BP 138/80   Pulse (!) 111   Ht 4\' 9"  (1.448 m)   Wt 228 lb (103.4 kg)   LMP 10/29/2022   SpO2 100%   BMI 49.34 kg/m   General: NAD, able to participate in exam CV: tachycardic, normal S1/S2 Respiratory: No respiratory distress, lungs clear although exam limited by habitus Skin: warm and dry Psych: Normal affect and mood Neuro: grossly intact Diabetic foot exam was performed with the following findings:   No deformities, ulcerations, or other skin breakdown Normal sensation of 10g monofilament Intact posterior tibialis and dorsalis pedis pulses     ASSESSMENT/PLAN:   Type 2 diabetes mellitus without complication, without long-term current use of insulin ([facility]) Most recent A1c 6.9% last month. Diabetic foot exam performed today and within normal limits. Continue current meds, next A1c in 5 months (Sept 2024)  Class 3 severe obesity with serious comorbidity and body mass index (BMI) of 50.0 to 59.9 in adult ([facility]) BMI 50 with multiple comorbidities. Continue efforts at lifestyle modification and Ozempic 1mg  weekly for diabetes. STOP-BANG score elevated, correlating to high risk of OSA; Sleep study ordered. Follow up in 1 month.  Sinus tachycardia Chronic. Workup with labs and echo previously unremarkable. Felt to be  secondary to profound deconditioning. Check sleep study as above. If efforts at weight loss do not improve tachycardia, would consider metoprolol.      Maury Dus, MD [facility]

## 2022-11-05 NOTE — Patient Instructions (Signed)
It was great to see you!  I will order a sleep study to evaluate for sleep apnea. Someone will call you to schedule this.  Follow up at your earliest convenience for an annual physical with pap smear.   Take care,  Dr Anner Crete

## 2022-11-06 NOTE — Assessment & Plan Note (Signed)
Chronic. Workup with labs and echo previously unremarkable. Felt to be secondary to profound deconditioning. Check sleep study as above. If efforts at weight loss do not improve tachycardia, would consider metoprolol.

## 2022-11-06 NOTE — Assessment & Plan Note (Signed)
Most recent A1c 6.9% last month. Diabetic foot exam performed today and within normal limits. Continue current meds, next A1c in 5 months (Sept 2024)

## 2022-11-06 NOTE — Assessment & Plan Note (Signed)
BMI 50 with multiple comorbidities. Continue efforts at lifestyle modification and Ozempic 1mg  weekly for diabetes. STOP-BANG score elevated, correlating to high risk of OSA; Sleep study ordered. Follow up in 1 month.

## 2022-11-18 ENCOUNTER — Other Ambulatory Visit: Payer: Self-pay | Admitting: Family Medicine

## 2022-11-27 ENCOUNTER — Telehealth: Payer: Self-pay | Admitting: *Deleted

## 2022-11-27 NOTE — Telephone Encounter (Signed)
FYI:   Received from [facility] at [facility] stating that patient needs the PA for her sleep test done by the pcp office.  PA pending with [employer].  Procedure Tracking #: [id number]

## 2022-12-03 NOTE — Telephone Encounter (Signed)
Dr. Danella Sensing (unsure of spelling) LVM on nurse line requesting a peer to peer for patients sleep study.   Phone number he can be contacted for review is [phone].  Will forward to PCP.

## 2022-12-03 NOTE — Telephone Encounter (Signed)
Called Danella Sensing for peer-to-peer. No answer. Left VM and included my cell # for him to reach me.   Maury Dus, MD PGY-3, [facility]

## 2022-12-19 ENCOUNTER — Other Ambulatory Visit: Payer: Self-pay | Admitting: Family Medicine

## 2022-12-19 DIAGNOSIS — M797 Fibromyalgia: Secondary | ICD-10-CM

## 2022-12-19 DIAGNOSIS — E119 Type 2 diabetes mellitus without complications: Secondary | ICD-10-CM

## 2022-12-19 DIAGNOSIS — E785 Hyperlipidemia, unspecified: Secondary | ICD-10-CM

## 2022-12-19 DIAGNOSIS — M791 Myalgia, unspecified site: Secondary | ICD-10-CM

## 2022-12-25 ENCOUNTER — Other Ambulatory Visit: Payer: Self-pay | Admitting: Family Medicine

## 2023-01-09 ENCOUNTER — Other Ambulatory Visit: Payer: Self-pay | Admitting: Family Medicine

## 2023-01-19 ENCOUNTER — Ambulatory Visit: Payer: Medicare HMO | Admitting: Family Medicine

## 2023-01-21 ENCOUNTER — Other Ambulatory Visit: Payer: Self-pay | Admitting: Family Medicine

## 2023-01-21 DIAGNOSIS — E559 Vitamin D deficiency, unspecified: Secondary | ICD-10-CM

## 2023-01-21 DIAGNOSIS — M797 Fibromyalgia: Secondary | ICD-10-CM

## 2023-01-30 ENCOUNTER — Encounter: Payer: Self-pay | Admitting: Family Medicine

## 2023-01-30 ENCOUNTER — Telehealth: Payer: Self-pay

## 2023-01-30 ENCOUNTER — Ambulatory Visit (INDEPENDENT_AMBULATORY_CARE_PROVIDER_SITE_OTHER): Payer: Medicare HMO | Admitting: Family Medicine

## 2023-01-30 ENCOUNTER — Ambulatory Visit (HOSPITAL_COMMUNITY)
Admission: RE | Admit: 2023-01-30 | Discharge: 2023-01-30 | Disposition: A | Discharge status: Home / Self Care | Payer: Medicare HMO | Source: Ambulatory Visit | Attending: Family Medicine | Admitting: Family Medicine

## 2023-01-30 VITALS — BP 126/84 | HR 117 | Ht <= 58 in | Wt 226.8 lb

## 2023-01-30 DIAGNOSIS — R0600 Dyspnea, unspecified: Secondary | ICD-10-CM

## 2023-01-30 DIAGNOSIS — L989 Disorder of the skin and subcutaneous tissue, unspecified: Secondary | ICD-10-CM

## 2023-01-30 DIAGNOSIS — R002 Palpitations: Secondary | ICD-10-CM

## 2023-01-30 DIAGNOSIS — E119 Type 2 diabetes mellitus without complications: Secondary | ICD-10-CM

## 2023-01-30 DIAGNOSIS — E118 Type 2 diabetes mellitus with unspecified complications: Secondary | ICD-10-CM | POA: Diagnosis not present

## 2023-01-30 DIAGNOSIS — R000 Tachycardia, unspecified: Secondary | ICD-10-CM

## 2023-01-30 MED ORDER — TRIAMCINOLONE ACETONIDE 0.1 % EX CREA
1.0000 | TOPICAL_CREAM | Freq: Two times a day (BID) | CUTANEOUS | 1 refills | Status: DC
Start: 2023-01-30 — End: 2023-04-06

## 2023-01-30 NOTE — Progress Notes (Signed)
    SUBJECTIVE:   CHIEF COMPLAINT / HPI: Dyspnea, Tachycardia, Rash  Since her last visit in April, [name] [name] has been experiencing worsening shortness of breath, despite inhaler use. Lately she has able to walk about 1000 ft before becoming short of breath. She sometimes experiences shortness of breath at rest as well when her heart beats fast. She sleeps with several pillows at night to help her breathe better.   The patient also chronically experiences episodes of rapid heartbeats. These episodes occur at rest and last around 10 minutes. During these episodes, she also experiences shortness of breath and jitteriness at times. No chest pain or vision changes. When these episodes occur, she just rests and they will end on their own.   For a few weeks now, the patient has also experienced an itchy, spotted rash across her chest and abdomen. She was given steroid cream for this before but has run out. The spots on her stomach have been somewhat healing, whereas the spots on her chest continue to be red, very itchy, and excoriated at times.   The patient's diabetes control has been stable, with an HbA1c of 6.9 in April. She continues her home medication regimen and is scheduled for a diabetes follow-up in September.    PERTINENT  PMH / PSH: Diabetes, HTN, fibromyalgia, cerebral palsy  OBJECTIVE:   BP 126/84   Pulse (!) 119   Ht 4\' 9"  (1.448 m)   Wt 226 lb 12.8 oz (102.9 kg)   LMP 01/26/2023   SpO2 100%   BMI 49.08 kg/m    PE: General: Well appearing, resting comfortably CV: Distant heart sounds, S1/S2. No extra heart sounds. 3+ pulses. Warm, well-perfused. Pulm: Clear to auscultation bilaterally. No increased WOB. Skin: Several 0.5-1cm excoriated, erythematous papules dispersed across chest and abdomen   ASSESSMENT/PLAN:   Dyspnea - cont home albuterol, breo ellipta - cardio workup as outlined below  Tachycardia - CBC, BMP - Urine tox screen - EKG today was unremarkable  (last done in 2021- unremarkable findings) - Echocardiogram to be scheduled (last done in 2021 - unremarkable findings)   Rash - Refilled triamcinolone 0.1% - Patient to schedule dermatology follow up appt at [facility]  Diabetes - cont home medications - most recent HgA1c 6.9 (09/30/22) - f/u scheduled for September   Ivery Quale, MD [facility]

## 2023-01-30 NOTE — Assessment & Plan Note (Signed)
Chronic tachycardia. Episodes last around 10 minutes and improve on their own. Experiences jitteriness and SOB during episodes. No CP/VC.   Workup including: CBC, BMP, EKG, Echo, UDS

## 2023-01-30 NOTE — Assessment & Plan Note (Signed)
Well controlled with most recent HgA1c 6.9 (09/30/2022)  Continue home medications. Follow up in September.

## 2023-01-30 NOTE — Patient Instructions (Signed)
Thank you for visiting today- it was great to see you! For the palpitations you've been experiencing, we are working it up through labs, EKG, and an echocardiogram. You will receive a call about scheduling the echocardiogram. 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 We have refilled your steroid cream for use on the rash. On your way out today, please stop by the front desk to schedule a dermatology appt to follow up on the rash. We will see you for your upcoming visit in September- this visit can be a full physical visit.

## 2023-01-30 NOTE — Telephone Encounter (Signed)
Spoke with patient. Informed of ECHO July 19th at 2:00pm. Patient understood. Aquilla Solian, CMA

## 2023-01-31 LAB — BASIC METABOLIC PANEL
BUN/Creatinine Ratio: 10 (ref 9–23)
BUN: 7 mg/dL (ref 6–24)
CO2: 23 mmol/L (ref 20–29)
Calcium: 9.6 mg/dL (ref 8.7–10.2)
Chloride: 98 mmol/L (ref 96–106)
Creatinine, Ser: 0.67 mg/dL (ref 0.57–1.00)
Glucose: 322 mg/dL — ABNORMAL HIGH (ref 70–99)
Potassium: 3.7 mmol/L (ref 3.5–5.2)
Sodium: 137 mmol/L (ref 134–144)
eGFR: 110 mL/min/{1.73_m2} (ref >59)

## 2023-01-31 LAB — CBC
Hematocrit: 42 % (ref 34.0–46.6)
Hemoglobin: 13.5 g/dL (ref 11.1–15.9)
MCH: 28 pg (ref 26.6–33.0)
MCHC: 32.1 g/dL (ref 31.5–35.7)
MCV: 87 fL (ref 79–97)
Platelets: 356 10*3/uL (ref 150–450)
RBC: 4.82 x10E6/uL (ref 3.77–5.28)
RDW: 12.3 % (ref 11.7–15.4)
WBC: 5 10*3/uL (ref 3.4–10.8)

## 2023-01-31 LAB — TSH RFX ON ABNORMAL TO FREE T4: TSH: 2.11 u[IU]/mL (ref 0.450–4.500)

## 2023-02-02 NOTE — Progress Notes (Signed)
ECHO appt already made for 7/19. Aquilla Solian, CMA

## 2023-02-13 ENCOUNTER — Ambulatory Visit (HOSPITAL_COMMUNITY): Payer: Medicare HMO

## 2023-02-19 ENCOUNTER — Ambulatory Visit: Payer: Medicare HMO

## 2023-02-26 ENCOUNTER — Other Ambulatory Visit: Payer: Self-pay

## 2023-02-26 ENCOUNTER — Ambulatory Visit (HOSPITAL_COMMUNITY): Admission: RE | Admit: 2023-02-26 | Payer: Medicare HMO | Source: Ambulatory Visit

## 2023-02-26 MED ORDER — ALBUTEROL SULFATE HFA 108 (90 BASE) MCG/ACT IN AERS: INHALATION_SPRAY | RESPIRATORY_TRACT | 1 refills | Status: DC

## 2023-02-26 NOTE — Telephone Encounter (Signed)
Patient would like to have her albuterol filled. She requested it with the pharmacy and they have told her we are not responding. I do not see where we had received that request.   Please advise.

## 2023-03-16 ENCOUNTER — Ambulatory Visit: Payer: Medicare HMO

## 2023-04-04 ENCOUNTER — Other Ambulatory Visit: Payer: Self-pay | Admitting: Family Medicine

## 2023-04-04 DIAGNOSIS — L989 Disorder of the skin and subcutaneous tissue, unspecified: Secondary | ICD-10-CM

## 2023-04-06 ENCOUNTER — Ambulatory Visit (HOSPITAL_COMMUNITY)
Admission: RE | Admit: 2023-04-06 | Discharge: 2023-04-06 | Disposition: A | Discharge status: Home / Self Care | Payer: Medicare HMO | Source: Ambulatory Visit | Attending: Family Medicine | Admitting: Family Medicine

## 2023-04-06 DIAGNOSIS — R000 Tachycardia, unspecified: Secondary | ICD-10-CM

## 2023-04-06 DIAGNOSIS — R002 Palpitations: Secondary | ICD-10-CM

## 2023-04-06 DIAGNOSIS — R0609 Other forms of dyspnea: Secondary | ICD-10-CM | POA: Diagnosis not present

## 2023-04-06 DIAGNOSIS — I10 Essential (primary) hypertension: Secondary | ICD-10-CM | POA: Insufficient documentation

## 2023-04-06 DIAGNOSIS — R0600 Dyspnea, unspecified: Secondary | ICD-10-CM

## 2023-04-06 LAB — ECHOCARDIOGRAM COMPLETE
Area-P 1/2: 6.65 cm2
Calc EF: 51.5 %
S' Lateral: 2.3 cm
Single Plane A2C EF: 50.6 %
Single Plane A4C EF: 54.1 %

## 2023-04-08 ENCOUNTER — Telehealth: Payer: Self-pay

## 2023-04-08 NOTE — Telephone Encounter (Signed)
Patient calls nurse line request EKG results.   Will forward to PCP.

## 2023-04-14 ENCOUNTER — Telehealth: Payer: Self-pay | Admitting: Family Medicine

## 2023-04-14 NOTE — Telephone Encounter (Signed)
Second attempt calling patient, with first attempt last week. Left voice message again for patient re: phone call to discuss echo results. No concerning findings at this time. Let patient know we could discuss further at her upcoming clinic appt and to reach out to clinic in the meantime with any questions or concerns.

## 2023-04-16 ENCOUNTER — Other Ambulatory Visit: Payer: Self-pay

## 2023-04-17 MED ORDER — AMLODIPINE BESYLATE-VALSARTAN 10-160 MG PO TABS: 1.0000 | ORAL_TABLET | Freq: Every day | ORAL | 0 refills | Status: DC

## 2023-04-21 ENCOUNTER — Other Ambulatory Visit: Payer: Self-pay | Admitting: Family Medicine

## 2023-04-23 ENCOUNTER — Encounter: Payer: Medicare HMO | Admitting: Family Medicine

## 2023-04-23 DIAGNOSIS — H524 Presbyopia: Secondary | ICD-10-CM | POA: Diagnosis not present

## 2023-04-28 ENCOUNTER — Ambulatory Visit: Payer: Medicare HMO | Admitting: Family Medicine

## 2023-04-28 ENCOUNTER — Encounter: Payer: Self-pay | Admitting: Family Medicine

## 2023-04-28 ENCOUNTER — Other Ambulatory Visit: Payer: Self-pay | Admitting: Family Medicine

## 2023-04-28 VITALS — BP 110/76 | HR 115 | Wt 226.0 lb

## 2023-04-28 DIAGNOSIS — E119 Type 2 diabetes mellitus without complications: Secondary | ICD-10-CM | POA: Diagnosis not present

## 2023-04-28 DIAGNOSIS — Z23 Encounter for immunization: Secondary | ICD-10-CM

## 2023-04-28 DIAGNOSIS — R7303 Prediabetes: Secondary | ICD-10-CM

## 2023-04-28 LAB — POCT GLYCOSYLATED HEMOGLOBIN (HGB A1C): HbA1c, POC (controlled diabetic range): 7.6 % — AB (ref 0.0–7.0)

## 2023-04-28 MED ORDER — FLUTICASONE FUROATE-VILANTEROL 200-25 MCG/ACT IN AEPB: 1.0000 | INHALATION_SPRAY | Freq: Every day | RESPIRATORY_TRACT | 3 refills | Status: DC

## 2023-04-28 MED ORDER — ALBUTEROL SULFATE HFA 108 (90 BASE) MCG/ACT IN AERS: INHALATION_SPRAY | RESPIRATORY_TRACT | 1 refills | Status: DC

## 2023-04-28 MED ORDER — DULOXETINE HCL 60 MG PO CPEP: 120.0000 mg | ORAL_CAPSULE | Freq: Every day | ORAL | 2 refills | Status: DC

## 2023-04-28 MED ORDER — OMEPRAZOLE 20 MG PO CPDR: 20.0000 mg | DELAYED_RELEASE_CAPSULE | Freq: Every day | ORAL | 1 refills | Status: DC

## 2023-04-28 MED ORDER — METFORMIN HCL ER 500 MG PO TB24
1000.0000 mg | ORAL_TABLET | Freq: Every day | ORAL | 2 refills | Status: DC
Start: 2023-04-28 — End: 2023-05-20

## 2023-04-28 MED ORDER — SEMAGLUTIDE(0.25 OR 0.5MG/DOS) 2 MG/3ML ~~LOC~~ SOPN: 0.5000 mg | PEN_INJECTOR | SUBCUTANEOUS | 2 refills | Status: DC

## 2023-04-28 NOTE — Progress Notes (Signed)
    SUBJECTIVE:   CHIEF COMPLAINT / HPI:   DM  diabetic neuropathy: Patient with chronic neuropathic pain secondary to diabetes.  Over the last 2 weeks, patient has noticed worsening bilateral arm and leg pain.  Pain is worse at night (feels really heavy) and in the morning (more ache).  Pain is constant, 8/0 at worst.  She has been taking gabapentin 800mg  3 times daily, which helps some but not fully.  She supplements with ibuprofen or Tylenol as needed, has needed to supplement multiple times a week recently.  She notes 2 falls last month.  1 fall from leaning too far back in her chair, and another fall from bed while asleep.  She notes some lightheadedness upon standing on occasion, not frequently.  Last A1c 6.9 in March 2024.    Depression/anxiety: Patient has been crying a lot, having little energy, and notes decreased appetite.  PHQ-9 score of 14 today, no active SI/HI.  She is interested in pursuing therapy and is well-connected with social work regarding SDOH.  Currently takes Cymbalta 60 mg/day.    Nausea: Over the past couple of months patient has had increasing nausea upon eating.  After eating just a little, she will become nauseous and not feel able to continue eating.  No vomiting, sometimes feels regurgitation into esophagus.  She notes some heartburn symptoms occasionally.  PERTINENT  PMH / PSH: T2DM, HTN, Depression/anxiety, Cerebral Palsy  OBJECTIVE:   BP 110/76   Pulse (!) 115   Wt 226 lb (102.5 kg)   LMP 04/26/2023   SpO2 96%   BMI 48.91 kg/m   General: Well-appearing. Resting comfortably in room. CV: Normal S1/S2. No extra heart sounds. Warm and well-perfused. Pulm: Breathing comfortably on room air. CTAB. No increased WOB. Abd: Soft, non-tender, non-distended. Skin:  Warm, dry. Ext: Equal bilateral tenderness to palpation of arms and legs without increased warmth or erythema.  1+ pitting edema of left lower extremity without increased warmth or erythema. Psych:  Pleasant and appropriate.    ASSESSMENT/PLAN:   DM  Diabetic Neuropathy A1c of 7.6 in clinic today.  BMP with normal GFR  and normal microalbumin/creatinine ratio in July 2024.  Last lipid panel in 2023 with elevated triglycerides. Neuropathy symptoms appear to be progressing at this time.  Patient feels that left lower extremity often swells more than the right ever since ankle surgery, not concerning for DVT at this time given equal bilateral pain and lack of erythema or increased warmth. -Increase metformin to 1000 mg/day -Increase Cymbalta to 120 mg at night, to help with neuropathy and mood -Lipid panel ordered  Depression/Anxiety Patient continues to experience unwanted symptoms.  No active SI/HI. -Increase Cymbalta to 120 mg at night as per above -Therapy resources included in AVS  Nausea Patient's nausea may be secondary to Ozempic use versus reflux symptoms.  More likely secondary to Ozempic use given chronicity. -Decrease Ozempic to 0.5 mg/week -Prilosec 20 daily for possible reflux symptoms   Return to clinic in 2 to 4 weeks for follow-up.  Ivery Quale, MD [facility]

## 2023-04-28 NOTE — Patient Instructions (Addendum)
Thank you for visiting clinic today - it is always our pleasure to care for you.  Today we discussed your diabetes, arm and leg pain, your mood, and your nausea.  For your diabetes, we have increased your metformin dosage to 1000 mg/day because your A1c was elevated.  We are also checking your lipid panel today.  We decreased your Ozempic dosage to 0.5 mg/week to help with nausea.  For your pain and mood, we have increased your Cymbalta to 120 mg/day.  If you notice any confusion while taking this new dose, please return to your previous dose.  Please see below for additional therapy resources.  For your nausea, the decreased Ozempic dose may help.  We have also sent a prescription for Prilosec to help with any possible reflux symptoms.  Please schedule an appointment in 2 to 4 weeks to follow-up on your mood and symptoms.  Reach out any time with any questions or concerns you may have - we are here for you!  Ivery Quale, MD [facility] [phone]    Therapy and Counseling Resources Most providers on this list will take Medicaid. Patients with commercial insurance or Medicare should contact their insurance company to get a list of in network providers.  [facility]:Psychiatry and Counseling [address] [place], [place] [address] [phone]  [facility]  [address], Suite [place], [place] [address]      [phone]  [facility] [address]  [place], [place] [address] [phone]  [facility] [address], [address]  [place], [place] [address]       [phone]     [facility] (virtual only) [phone]  Jovita Kussmaul [facility] [address], [place], [place] [phone]  [facility]:  [address] [place] [phone]  [facility]:  [address] [place] [place] [phone]  [facility] (under & uninsured) [address]   [place] [phone]    [email or web address]    [facility] [address]  [place]    [phone]  [facility] -[address]     Phone:  [phone]     [facility]-  [address]  [phone]   [facility] [address]      [place], [place] [phone]  [facility]: [address] [place], [place], [place]  [phone]   [facility] (Spanish therapist) [email or web address]  [address] [place]  [address]   [place] [address]    [phone]    [facility]   [address],  [place], [place]  [phone]   [facility]  [address] [place]  [phone]  [facility]  [address] [place], [place]        [phone]  Open Access/Walk In Clinic under & uninsured  [facility]  [address] [place], [place] [phone] Crisis [phone]  [facility] [place],  (Spanish)   [address], [place]: [phone]) 8:30 - 12; 1 - 2:30  [facility] [employer],  [address] [place], [place]    ([phone]):8:30 - 12; 2 - 3PM  [facility] [employer],  [address],  [place]; [phone]):   Mon - Fri 8 AM -  5 PM  [facility] [address] [place]  MWF 12:30 to 3:00 or call to schedule an appointment  [phone]  Specific Provider options Psychology Today  [email or web address].psychologytoday.com/us click on find a therapist  enter your zip code left side and select or tailor a therapist for your specific need.   [facility] Provider Directory [email or web address]  (Medicaid)   Follow all drop down to find a provider  Social Support program [facility] [place] [phone] or PhotoSolver.pl [address],  [place], [place] Recovery support and educational   24- Hour Availability:   [facility]  [address] [place], [place] [phone] Crisis [phone]  [facility] [phone]  [place] [facility]  [phone]   [facility]  [phone] (after hours)  Therapeutic Alternative/Mobile Crisis   [phone]  [place] National Suicide Hotline  [phone] Len Childs)  Call 911 or go to emergency room  [facility]  [phone]);  [place] and [employer]  [phone]); [place], [place], [place], [place], [place], [place], [place]

## 2023-04-29 LAB — LIPID PANEL
Chol/HDL Ratio: 2.2 {ratio} (ref 0.0–4.4)
Cholesterol, Total: 134 mg/dL (ref 100–199)
HDL: 62 mg/dL (ref >39)
LDL Chol Calc (NIH): 49 mg/dL (ref 0–99)
Triglycerides: 133 mg/dL (ref 0–149)
VLDL Cholesterol Cal: 23 mg/dL (ref 5–40)

## 2023-04-30 NOTE — Telephone Encounter (Signed)
Per patient pharmacy, adjusted to medication that is preferred by patient's insurance

## 2023-05-01 ENCOUNTER — Ambulatory Visit (INDEPENDENT_AMBULATORY_CARE_PROVIDER_SITE_OTHER): Payer: Medicare HMO

## 2023-05-01 VITALS — Ht <= 58 in | Wt 226.0 lb

## 2023-05-01 DIAGNOSIS — Z0000 Encounter for general adult medical examination without abnormal findings: Secondary | ICD-10-CM | POA: Diagnosis not present

## 2023-05-01 NOTE — Patient Instructions (Signed)
[name] [name] , Thank you for taking time to come for your Medicare Wellness Visit. I appreciate your ongoing commitment to your health goals. Please review the following plan we discussed and let me know if I can assist you in the future.   Referrals/Orders/Follow-Ups/Clinician Recommendations: Aim for 30 minutes of exercise or brisk walking, 6-8 glasses of water, and 5 servings of fruits and vegetables each day.  This is a list of the screening recommended for you and due dates:  Health Maintenance  Topic Date Due   DTaP/Tdap/Td vaccine (4 - Td or Tdap) 03/22/2021   Pap with HPV screening  05/27/2021   Mammogram  02/07/2022   Colon Cancer Screening  Never done   COVID-19 Vaccine (4 - 2023-24 season) 03/29/2023   Yearly kidney health urinalysis for diabetes  09/30/2023   Eye exam for diabetics  10/21/2023   Hemoglobin A1C  10/27/2023   Complete foot exam   11/05/2023   Yearly kidney function blood test for diabetes  01/30/2024   Medicare Annual Wellness Visit  04/30/2024   Flu Shot  Completed   Hepatitis C Screening  Completed   HIV Screening  Completed   HPV Vaccine  Aged Out    Advanced directives: (ACP Link)Information on Advanced Care Planning can be found at [facility] [place] Advance Health Care Directives Advance Health Care Directives ([email or web address]   Next Medicare Annual Wellness Visit scheduled for next year: Yes

## 2023-05-01 NOTE — Progress Notes (Signed)
Subjective:   [name] [name] is a 47 y.o. female who presents for an Initial Medicare Annual Wellness Visit.  Visit Complete: Virtual  I connected with  [name] [name] on 05/01/23 by a audio enabled telemedicine application and verified that I am speaking with the correct person using two identifiers.  Patient Location: Home  Provider Location: Home Office  I discussed the limitations of evaluation and management by telemedicine. The patient expressed understanding and agreed to proceed.  Because this visit was a virtual/telehealth visit, some criteria may be missing or patient reported. Any vitals not documented were not able to be obtained and vitals that have been documented are patient reported.   Cardiac Risk Factors include: diabetes mellitus;hypertension;sedentary lifestyle;obesity (BMI >30kg/m2)     Objective:    Today's Vitals   05/01/23 1713  Weight: 226 lb (102.5 kg)  Height: 4\' 9"  (1.448 m)   Body mass index is 48.91 kg/m.     05/01/2023    5:23 PM 04/28/2023   10:52 AM 01/30/2023    1:26 PM 11/05/2022   11:54 AM 09/30/2022    2:47 PM 01/21/2022    2:27 PM 11/12/2021   10:20 AM  Advanced Directives  Does Patient Have a Medical Advance Directive? No No No No No No No  Would patient like information on creating a medical advance directive? Yes (MAU/Ambulatory/Procedural Areas - Information given) No - Patient declined  No - Patient declined No - Patient declined No - Patient declined No - Patient declined    Current Medications (verified) Outpatient Encounter Medications as of 05/01/2023  Medication Sig   acetaminophen (TYLENOL) 650 MG CR tablet Take 650 mg by mouth every 8 (eight) hours as needed for pain.   albuterol (VENTOLIN HFA) 108 (90 Base) MCG/ACT inhaler INHALE 2 PUFFS BY MOUTH EVERY 4 HOURS AS NEEDED FOR WHEEZE OR FOR SHORTNESS OF BREATH   amitriptyline (ELAVIL) 50 MG tablet TAKE 1 TABLET BY MOUTH EVERYDAY AT BEDTIME   amLODipine-valsartan (EXFORGE) 10-160  MG tablet Take 1 tablet by mouth daily.   budesonide-formoterol (SYMBICORT) 80-4.5 MCG/ACT inhaler Inhale 2 puffs into the lungs in the morning and at bedtime.   cholecalciferol (VITAMIN D3) 25 MCG (1000 UNIT) tablet TAKE 1 TABLET BY MOUTH EVERY DAY   DULoxetine (CYMBALTA) 60 MG capsule Take 2 capsules (120 mg total) by mouth daily.   gabapentin (NEURONTIN) 400 MG capsule TAKE 2 CAPSULES (800 MG TOTAL) BY MOUTH 3 (THREE) TIMES DAILY.   ibuprofen (ADVIL) 600 MG tablet TAKE 1 TABLET BY MOUTH EVERY 8 HOURS AS NEEDED   metFORMIN (GLUCOPHAGE-XR) 500 MG 24 hr tablet Take 2 tablets (1,000 mg total) by mouth daily. Please schedule office visit prior to any additional refills.   omeprazole (PRILOSEC) 20 MG capsule Take 1 capsule (20 mg total) by mouth daily.   rosuvastatin (CRESTOR) 5 MG tablet TAKE 1 TABLET (5 MG TOTAL) BY MOUTH DAILY.   Semaglutide,0.25 or 0.5MG /DOS, 2 MG/3ML SOPN Inject 0.5 mg into the skin once a week.   triamcinolone cream (KENALOG) 0.1 % APPLY TO AFFECTED AREA TWICE A DAY   No facility-administered encounter medications on file as of 05/01/2023.    Allergies (verified) Pineapple, Tomato, and Hydrocodone   History: Past Medical History:  Diagnosis Date   Abnormal Pap smear    Alcohol abuse 08/22/2015   Asthma    Cerebral palsy (HCC)    Depression    no medication treatment    Fall 08/03/2020   Reports fall on 08/01/2020, leaving  her right side bruised and sore. Denies LOC, head injury, blood loss. No gait abnormality or gross abnormality.     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, LEG TREMORS   Paroxysmal nocturnal dyspnea 02/09/2020   Skin lesion 08/03/2020   Past Surgical History:  Procedure Laterality Date   CESAREAN SECTION  03/20/2011   Procedure: CESAREAN  SECTION;  Surgeon: Leighton Roach Meisinger;  Location: [facility];  Service: Gynecology;  Laterality: N/A;  Primary Female at 47   MOUTH SURGERY     47 years old   MYOMECTOMY  03/20/2011   Procedure: MYOMECTOMY;  Surgeon: Leighton Roach Meisinger;  Location: [facility];  Service: Gynecology;  Laterality: N/A;   ORIF ANKLE FRACTURE Left 09/10/2017   Procedure: OPEN REDUCTION INTERNAL FIXATION (ORIF) LEFT MALLEOLUS ANKLE FRACTURE AND SYNDESMOSIS;  Surgeon: Myrene Galas, MD;  Location: [facility];  Service: Orthopedics;  Laterality: Left;   Family History  Problem Relation Age of Onset   Birth defects Cousin        Down Syndrome   Anesthesia problems Mother    Anxiety disorder Mother    Arthritis Mother    Cancer Mother    Depression Mother    Diabetes Mother    Early death Mother    Asthma Father    Hypertension Father    Alcohol abuse Brother    Drug abuse Brother    Alcohol abuse Maternal Uncle    Diabetes Maternal Uncle    Drug abuse Maternal Uncle    Cancer Maternal Grandmother    Stroke Maternal Grandmother    Alcohol abuse Maternal Grandfather    Cancer Maternal Grandfather    Obesity Maternal Grandfather    Social History   Socioeconomic History   Marital status: Single    Spouse name: Not on file   Number of children: Not on file   Years of education: Not on file   Highest education level: Not on file  Occupational History   Not on file  Tobacco Use   Smoking status: Never    Passive exposure: Never   Smokeless tobacco: Never  Vaping Use   Vaping status: Never Used  Substance and Sexual Activity   Alcohol use: Yes    Alcohol/week: 1.0 standard drink of alcohol    Types: 1 Glasses of wine per week    Comment: 1 glass wine per day until known pregnancy at approx 12 weeks   Drug use: No   Sexual activity: Not Currently    Birth control/protection: None    Comment: FOB not currently involved  Other Topics Concern   Not on file  Social History Narrative   Single Mom but dad is  involved.  Dtr Marylene Land born 2010.      Social Determinants of Health   Financial Resource Strain: Low Risk  (05/01/2023)   Overall Financial Resource Strain (CARDIA)    Difficulty of Paying Living Expenses: Not hard at all  Food Insecurity: No Food Insecurity (05/01/2023)   Hunger Vital Sign    Worried About Running Out of Food in the Last Year: Never true    Ran Out of Food in the Last Year: Never true  Transportation Needs: No Transportation Needs (05/01/2023)  PRAPARE - Administrator, Civil Service (Medical): No    Lack of Transportation (Non-Medical): No  Physical Activity: Inactive (05/01/2023)   Exercise Vital Sign    Days of Exercise per Week: 0 days    Minutes of Exercise per Session: 0 min  Stress: No Stress Concern Present (05/01/2023)   [employer] of Occupational Health - Occupational Stress Questionnaire    Feeling of Stress : Not at all  Social Connections: Moderately Isolated (05/01/2023)   Social Connection and Isolation Panel [NHANES]    Frequency of Communication with Friends and Family: More than three times a week    Frequency of Social Gatherings with Friends and Family: Three times a week    Attends Religious Services: More than 4 times per year    Active Member of Clubs or Organizations: No    Attends Banker Meetings: Never    Marital Status: Never married    Tobacco Counseling Counseling given: Not Answered   Clinical Intake:  Pre-visit preparation completed: Yes  Pain : No/denies pain     Diabetes: Yes CBG done?: No Did pt. bring in CBG monitor from home?: No  How often do you need to have someone help you when you read instructions, pamphlets, or other written materials from your doctor or pharmacy?: 1 - Never  Interpreter Needed?: No  Information entered by :: Kandis Fantasia LPN   Activities of Daily Living    05/01/2023    5:22 PM  In your present state of health, do you have any difficulty performing the  following activities:  Hearing? 0  Vision? 0  Difficulty concentrating or making decisions? 0  Walking or climbing stairs? 0  Dressing or bathing? 0  Doing errands, shopping? 0  Preparing Food and eating ? N  Using the Toilet? N  In the past six months, have you accidently leaked urine? N  Do you have problems with loss of bowel control? N  Managing your Medications? N  Managing your Finances? N  Housekeeping or managing your Housekeeping? N    Patient Care Team: Ivery Quale, MD as PCP - General  Indicate any recent Medical Services you may have received from other than [facility] providers in the past year (date may be approximate).     Assessment:   This is a routine wellness examination for [facility].  Hearing/Vision screen Hearing Screening - Comments:: Denies hearing difficulties   Vision Screening - Comments:: Wears rx glasses - up to date with routine eye exams with [facility]       Dolores Lory Addressed             This Visit's Progress    DIET - EAT MORE FRUITS AND VEGETABLES        Depression Screen    05/01/2023    5:17 PM 01/30/2023    3:33 PM 11/05/2022   11:58 AM 09/30/2022    2:46 PM 01/21/2022    2:26 PM 12/10/2021   11:17 AM 11/12/2021   10:19 AM  PHQ 2/9 Scores  PHQ - 2 Score 2 3 3 1 3 3 4   PHQ- 9 Score 7 10 10 4 11 8 14     Fall Risk    05/01/2023    5:22 PM 01/21/2022    2:26 PM 11/12/2021   10:19 AM 08/26/2021    1:51 PM 08/03/2020   11:30 AM  Fall Risk   Falls in the past year? 0 0 0 0 1  Number falls  in past yr: 0 0 0 0 0  Injury with Fall? 0 0 0 0 0  Risk for fall due to : No Fall Risks      Follow up Falls prevention discussed;Education provided;Falls evaluation completed        MEDICARE RISK AT HOME: Medicare Risk at Home Any stairs in or around the home?: No If so, are there any without handrails?: No Home free of loose throw rugs in walkways, pet beds, electrical cords, etc?: Yes Adequate lighting in your home to reduce risk of falls?: Yes Life  alert?: No Use of a cane, walker or w/c?: No Grab bars in the bathroom?: No Shower chair or bench in shower?: No Elevated toilet seat or a handicapped toilet?: No  TIMED UP AND GO:  Was the test performed? No    Cognitive Function:        05/01/2023    5:22 PM  6CIT Screen  What Year? 0 points  What month? 0 points  What time? 0 points  Count back from 20 0 points  Months in reverse 0 points  Repeat phrase 0 points  Total Score 0 points    Immunizations Immunization History  Administered Date(s) Administered   Influenza Whole 04/20/2008   Influenza, Seasonal, Injecte, Preservative Fre 04/28/2023   Influenza,inj,Quad PF,6+ Mos 08/16/2014, 05/25/2017, 05/27/2018, 04/25/2019, 04/30/2020   PFIZER Comirnaty(Gray Top)Covid-19 Tri-Sucrose Vaccine 10/09/2020   PFIZER(Purple Top)SARS-COV-2 Vaccination 11/24/2019, 12/16/2019   Td 07/28/1993, 06/18/2009   Tdap 03/23/2011    TDAP status: Due, Education has been provided regarding the importance of this vaccine. Advised may receive this vaccine at local pharmacy or Health Dept. Aware to provide a copy of the vaccination record if obtained from local pharmacy or Health Dept. Verbalized acceptance and understanding.  Flu Vaccine status: Up to date  Pneumococcal vaccine status: Up to date  Covid-19 vaccine status: Information provided on how to obtain vaccines.   Qualifies for [place] Vaccine? No    Screening Tests Health Maintenance  Topic Date Due   DTaP/Tdap/Td (4 - Td or Tdap) 03/22/2021   Cervical Cancer Screening (HPV/Pap Cotest)  05/27/2021   MAMMOGRAM  02/07/2022   Colonoscopy  Never done   COVID-19 Vaccine (4 - 2023-24 season) 03/29/2023   Diabetic kidney evaluation - Urine ACR  09/30/2023   OPHTHALMOLOGY EXAM  10/21/2023   HEMOGLOBIN A1C  10/27/2023   FOOT EXAM  11/05/2023   Diabetic kidney evaluation - eGFR measurement  01/30/2024   Medicare Annual Wellness (AWV)  04/30/2024   INFLUENZA VACCINE  Completed    Hepatitis C Screening  Completed   HIV Screening  Completed   HPV VACCINES  Aged Out    Health Maintenance  Health Maintenance Due  Topic Date Due   DTaP/Tdap/Td (4 - Td or Tdap) 03/22/2021   Cervical Cancer Screening (HPV/Pap Cotest)  05/27/2021   MAMMOGRAM  02/07/2022   Colonoscopy  Never done   COVID-19 Vaccine (4 - 2023-24 season) 03/29/2023    Colorectal cancer screening:  Patient would like to discuss with pcp   Mammogram status:  Patient would like to discuss with pcp  Lung Cancer Screening: (Low Dose CT Chest recommended if Age 68-80 years, 20 pack-year currently smoking OR have quit w/in 15years.) does not qualify.   Lung Cancer Screening Referral: n/a  Additional Screening:  Hepatitis C Screening: does qualify; Completed 05/27/18  Vision Screening: Recommended annual ophthalmology exams for early detection of glaucoma and other disorders of the eye. Is the patient up  to date with their annual eye exam? Yes  Who is the provider or what is the name of the office in which the patient attends annual eye exams? [facility]. If pt is not established with a provider, would they like to be referred to a provider to establish care? No .   Dental Screening: Recommended annual dental exams for proper oral hygiene  Diabetic Foot Exam: Diabetic Foot Exam: Completed 11/05/22  Community Resource Referral / Chronic Care Management: CRR required this visit?  No   CCM required this visit?  No     Plan:     I have personally reviewed and noted the following in the patient's chart:   Medical and social history Use of alcohol, tobacco or illicit drugs  Current medications and supplements including opioid prescriptions. Patient is not currently taking opioid prescriptions. Functional ability and status Nutritional status Physical activity Advanced directives List of other physicians Hospitalizations, surgeries, and ER visits in previous 12 months Vitals Screenings to  include cognitive, depression, and falls Referrals and appointments  In addition, I have reviewed and discussed with patient certain preventive protocols, quality metrics, and best practice recommendations. A written personalized care plan for preventive services as well as general preventive health recommendations were provided to patient.     Kandis Fantasia [place], [place]   16/07/[birth year]   After Visit Summary: (Mail) Due to this being a telephonic visit, the after visit summary with patients personalized plan was offered to patient via mail   Nurse Notes: Patient would like to schedule pap after next office visit

## 2023-05-04 ENCOUNTER — Telehealth: Payer: Self-pay | Admitting: Family Medicine

## 2023-05-04 NOTE — Telephone Encounter (Signed)
Left message about normal lipid lab findings and continuing medication regimen as is.

## 2023-05-12 ENCOUNTER — Other Ambulatory Visit: Payer: Self-pay | Admitting: Family Medicine

## 2023-05-20 ENCOUNTER — Other Ambulatory Visit: Payer: Self-pay | Admitting: Family Medicine

## 2023-05-20 DIAGNOSIS — E119 Type 2 diabetes mellitus without complications: Secondary | ICD-10-CM

## 2023-05-27 ENCOUNTER — Ambulatory Visit: Payer: Medicare HMO | Admitting: Family Medicine

## 2023-06-19 ENCOUNTER — Other Ambulatory Visit: Payer: Self-pay | Admitting: Family Medicine

## 2023-07-02 ENCOUNTER — Ambulatory Visit: Payer: Medicare HMO | Admitting: Family Medicine

## 2023-07-13 ENCOUNTER — Ambulatory Visit: Payer: Medicare HMO | Admitting: Family Medicine

## 2023-07-16 ENCOUNTER — Telehealth: Payer: Self-pay | Admitting: *Deleted

## 2023-07-16 ENCOUNTER — Ambulatory Visit: Payer: Medicare HMO | Admitting: Family Medicine

## 2023-07-16 ENCOUNTER — Encounter: Payer: Self-pay | Admitting: Family Medicine

## 2023-07-16 VITALS — BP 131/78 | HR 114 | Wt 236.0 lb

## 2023-07-16 DIAGNOSIS — Z9181 History of falling: Secondary | ICD-10-CM | POA: Diagnosis not present

## 2023-07-16 DIAGNOSIS — M797 Fibromyalgia: Secondary | ICD-10-CM

## 2023-07-16 DIAGNOSIS — F32A Depression, unspecified: Secondary | ICD-10-CM | POA: Diagnosis not present

## 2023-07-16 DIAGNOSIS — Z139 Encounter for screening, unspecified: Secondary | ICD-10-CM

## 2023-07-16 DIAGNOSIS — E119 Type 2 diabetes mellitus without complications: Secondary | ICD-10-CM

## 2023-07-16 DIAGNOSIS — Z7984 Long term (current) use of oral hypoglycemic drugs: Secondary | ICD-10-CM | POA: Diagnosis not present

## 2023-07-16 MED ORDER — METFORMIN HCL ER 500 MG PO TB24
1000.0000 mg | ORAL_TABLET | Freq: Every day | ORAL | 3 refills | Status: DC
Start: 2023-07-16 — End: 2024-02-08

## 2023-07-16 MED ORDER — SEMAGLUTIDE(0.25 OR 0.5MG/DOS) 2 MG/3ML ~~LOC~~ SOPN: 1.0000 mg | PEN_INJECTOR | SUBCUTANEOUS | 2 refills | Status: DC

## 2023-07-16 MED ORDER — AMITRIPTYLINE HCL 25 MG PO TABS: 25.0000 mg | ORAL_TABLET | Freq: Every day | ORAL | 2 refills | Status: DC

## 2023-07-16 MED ORDER — GABAPENTIN 400 MG PO CAPS
800.0000 mg | ORAL_CAPSULE | Freq: Three times a day (TID) | ORAL | 5 refills | Status: DC
Start: 2023-07-16 — End: 2024-05-19

## 2023-07-16 NOTE — Progress Notes (Signed)
SUBJECTIVE:   CHIEF COMPLAINT / HPI:   Rolling out of bed Patient notes she has rolled out of bed 3 times in the span of two weeks starting about a month ago. No falls in the past 2 weeks. She reports no head injury during falls as she has a cabinet next to her bed that caught her. She had sustained a lip injury that has now healed. No tongue biting, urinary/stool incontinence, or hx of seizures. Upon rolling out of bed, she reports immediately waking up and was not confused. She notes a history of rolling out of bed several times as a child and teenager, and that she talks a lot in her sleep. Reports no alcohol or other substance use.   DM  Neuropathy  Since decreasing her Ozempic dosage last visit, patient has noted unwanted weight gain and would like to increase her dosage once again. Her nausea has resolved, and she finds the Prilosec helpful with reflux symptoms. Since increasing her Cymbalta dosage last visit, patient reports unchanged neuropathic pain. She continues to have tingling/burning sensation in her arms and legs, most noticeably in the mornings.   Depression/Anxiety  Since increasing her Cymbalta dose last visit, patient has been feeling better. Reports she still cries at times, but her mood overall has improved. No SI/HI.  PERTINENT  PMH / PSH: T2DM, Depression, HTN, Asthma, Cerebral palsy   OBJECTIVE:   BP 131/78   Pulse (!) 114   Wt 236 lb (107 kg)   LMP 06/19/2023   SpO2 100%   BMI 51.07 kg/m   General: Well-appearing. Resting comfortably in room. CV: Normal S1/S2. No extra heart sounds. Warm and well-perfused. Normal radial and DP pulses bilaterally.  Pulm: Breathing comfortably on room air. CTAB. No increased WOB. Abd: Soft, non-tender.  Skin:  Warm, dry. Equal, intact response to monofilament test of bilateral feet.  Psych: Pleasant and appropriate.   Flowsheet Row Office Visit from 07/16/2023 in [facility] [place]. [facility]  PHQ-9 Total Score 4       Assessment & Plan History of falling Recent falls out of bed while asleep concerning for over-sedation. Current sedating meds include amitriptyline and gabapentin. Falls unlikely secondary to seizure activity given lack of signs/symptoms per HPI.  - Discussed decreasing amitriptyline dosage to 25 mg daily - Continue to avoid other sedating substances like alcohol  - Discussed using pillows or bed rails as a barrier to prevent falls - Patient to continue to monitor sleep patterns and neuropathic pain  Type 2 diabetes mellitus without complication, without long-term current use of insulin (HCC) Last A1c of 7.6 in October. Recent increase to metformin 1000 daily and decrease to Ozempic 0.5 weekly. Resolved nausea, unwanted weight gain. No other side effects noted. Unchanged neuropathic pain, with intact response to monofilament test for bilateral feet.  - Continue metformin 1000 daily - Increase Ozempic to 1 mg weekly - dicussed side effects to monitor  - Recheck A1c in 1-2 months  - Decreased amitriptyline per above  - Continue Cymbalta 120 daily, gabapentin 800 TID - Continue Prilosec 20 daily to prevent nausea/reflux symptoms  Depression, unspecified depression type Recent increase in Cymbalta dose with improving symptoms. No SI/HI. PHQ-9 score of 4 today, improved from 14 in October.  - Continue Cymbalta 120 mg daily  - Decreased amitriptyline per above  Encounter for screening involving social determinants of health (SDoH) Social work referral placed for patient to  discuss employment options while receiving disability insurance.   Return to clinic in 1 month for follow up.   Ivery Quale, MD [facility]

## 2023-07-16 NOTE — Progress Notes (Signed)
Complex Care Management Note   07/16/2023 Name: Vikkie Adolphus MRN: [id number] DOB: 30-Nov-1975  Illyanna Tisi is a 47 y.o. year old female who sees Ivery Quale, MD for primary care. I reached out to Aria Worthey by phone today to offer complex care management services.  Ms. Fretwell was given information about Complex Care Management services today including:   The Complex Care Management services include support from the care team which includes your Nurse Coordinator, Clinical Social Worker, or Pharmacist.  The Complex Care Management team is here to help remove barriers to the health concerns and goals most important to you. Complex Care Management services are voluntary, and the patient may decline or stop services at any time by request to their care team member.   Complex Care Management Consent Status: Patient agreed to services and verbal consent obtained.   Follow up plan:  Telephone appointment with complex care management team member scheduled for:  07/20/23  Encounter Outcome:  Patient Scheduled  [facility] Coordination Care Guide  Direct Dial: [phone]

## 2023-07-16 NOTE — Assessment & Plan Note (Signed)
Last A1c of 7.6 in October. Recent increase to metformin 1000 daily and decrease to Ozempic 0.5 weekly. Resolved nausea, unwanted weight gain. No other side effects noted. Unchanged neuropathic pain, with intact response to monofilament test for bilateral feet.  - Continue metformin 1000 daily - Increase Ozempic to 1 mg weekly - dicussed side effects to monitor  - Recheck A1c in 1-2 months  - Decreased amitriptyline per above  - Continue Cymbalta 120 daily, gabapentin 800 TID - Continue Prilosec 20 daily to prevent nausea/reflux symptoms

## 2023-07-16 NOTE — Assessment & Plan Note (Signed)
Recent falls out of bed while asleep concerning for over-sedation. Current sedating meds include amitriptyline and gabapentin. Falls unlikely secondary to seizure activity given lack of signs/symptoms per HPI.  - Discussed decreasing amitriptyline dosage to 25 mg daily - Continue to avoid other sedating substances like alcohol  - Discussed using pillows or bed rails as a barrier to prevent falls - Patient to continue to monitor sleep patterns and neuropathic pain

## 2023-07-16 NOTE — Patient Instructions (Addendum)
Thank you for visiting clinic today - it is always our pleasure to care for you.  Today we discussed: - For your recent falls from bed, we want to make sure your medications are not too sedating for you. We have decreased your amitriptyline medication to see if this helps. Please take 25 mg daily and continue to monitor your sleep and nerve pain. Please place pillows or bed rails beside you to help prevent falls from bed. Please continue to avoid other sedating substances like alcohol.  - We have increased your ozempic dose today. Please monitor for signs of nausea, abdominal pain, etc.  - I have placed a referral to our social work team regarding employment options. Someone should be contacting you over the next few weeks.   Please schedule an appointment in 1 month to check in on all of the above.   Reach out any time with any questions or concerns you may have - we are here for you!  [name] Quale, MD [facility] [phone]

## 2023-07-16 NOTE — Assessment & Plan Note (Signed)
Recent increase in Cymbalta dose with improving symptoms. No SI/HI. PHQ-9 score of 4 today, improved from 14 in October.  - Continue Cymbalta 120 mg daily  - Decreased amitriptyline per above

## 2023-07-20 ENCOUNTER — Other Ambulatory Visit: Payer: Self-pay | Admitting: Family Medicine

## 2023-07-20 ENCOUNTER — Ambulatory Visit: Payer: Self-pay | Admitting: Licensed Clinical Social Worker

## 2023-07-20 MED ORDER — SEMAGLUTIDE (1 MG/DOSE) 4 MG/3ML ~~LOC~~ SOPN: 1.0000 mg | PEN_INJECTOR | SUBCUTANEOUS | 3 refills | Status: DC

## 2023-07-20 NOTE — Patient Instructions (Signed)
Visit Information  Thank you for taking time to visit with me today. Please don't hesitate to contact me if I can be of assistance to you.   Following are the goals we discussed today:   Goals Addressed             This Visit's Progress    COMPLETED: Care Coordination       Activities and task to complete in order to accomplish goals.   Contact [employer] and/or [employer] to inquire about rental assistance.   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           Please call the care guide team at [phone] if you need to cancel or reschedule your appointment.   If you are experiencing a Mental Health or Behavioral Health Crisis or need someone to talk to, please go to [facility] [address] [place] [phone]) call 911   Patient verbalizes understanding of instructions and care plan provided today and agrees to view in MyChart. Active MyChart status and patient understanding of how to access instructions and care plan via MyChart confirmed with patient.     The patient has been provided with contact information for the care management team and has been advised to call with any health related questions or concerns.   Gwyndolyn Saxon MSW, LCSW Licensed Clinical Social Worker  [facility], [facility] Direct Dial: [phone]  Fax: [phone]

## 2023-07-20 NOTE — Progress Notes (Signed)
Updated Ozempic order per pharmacy request.

## 2023-07-20 NOTE — Progress Notes (Signed)
Entered in error. Please disregard

## 2023-07-20 NOTE — Progress Notes (Deleted)
Updated Ozempic

## 2023-07-20 NOTE — Patient Outreach (Signed)
  Care Coordination   Initial Visit Note   07/20/2023 Name: [name] [name] MRN: [id number] DOB: 21-Mar-1976  [name] [name] is a 47 y.o. year old female who sees Ivery Quale, MD for primary care. I spoke with  Janelle Floor Cape by phone today.  What matters to the patients health and wellness today?  [name] and LCSW A. Felton Clinton completed initial phone visit. Ms. Raxter express interest in returning to work part time. 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 Additionally, pt requested information regarding rental assistance for future use. Ms. Meckley provided information for [place] housing coalition and urban ministry.    Goals Addressed             This Visit's Progress    Care Coordination       Activities and task to complete in order to accomplish goals.   Contact [employer] and/or [employer] to inquire about rental assistance.   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 Also provided information for rental assistance [place] housing coalition and urban ministry rental assistance]       Follow up plan: No further intervention required.   Encounter Outcome:  Patient Visit Completed   Gwyndolyn Saxon MSW, LCSW Licensed Clinical Social Worker  [facility], Population Health Direct Dial:  [phone]  Fax: [phone]

## 2023-07-24 ENCOUNTER — Telehealth: Payer: Self-pay

## 2023-07-24 DIAGNOSIS — E119 Type 2 diabetes mellitus without complications: Secondary | ICD-10-CM

## 2023-07-24 NOTE — Telephone Encounter (Signed)
[facility] is requesting [facility] to be resent due to the directions not matching with the medication, please make the corrections and [facility] [address]. Penni Bombard CMA

## 2023-07-26 MED ORDER — SEMAGLUTIDE (1 MG/DOSE) 4 MG/3ML ~~LOC~~ SOPN
1.0000 mg | PEN_INJECTOR | SUBCUTANEOUS | 3 refills | Status: DC
Start: 2023-07-26 — End: 2023-12-10

## 2023-08-20 ENCOUNTER — Ambulatory Visit: Payer: Medicare HMO | Admitting: Family Medicine

## 2023-08-21 ENCOUNTER — Other Ambulatory Visit: Payer: Self-pay | Admitting: Family Medicine

## 2023-08-21 NOTE — Telephone Encounter (Signed)
Chart reviewed. Rx refilled.

## 2023-09-09 ENCOUNTER — Other Ambulatory Visit (HOSPITAL_COMMUNITY): Payer: Self-pay

## 2023-09-24 ENCOUNTER — Other Ambulatory Visit (HOSPITAL_COMMUNITY): Payer: Self-pay

## 2023-11-09 ENCOUNTER — Other Ambulatory Visit: Payer: Self-pay | Admitting: Family Medicine

## 2023-11-10 NOTE — Telephone Encounter (Signed)
 Chart reviewed. Rx refilled.

## 2023-11-23 ENCOUNTER — Other Ambulatory Visit: Payer: Self-pay | Admitting: Family Medicine

## 2023-11-23 DIAGNOSIS — M797 Fibromyalgia: Secondary | ICD-10-CM

## 2023-11-23 DIAGNOSIS — M791 Myalgia, unspecified site: Secondary | ICD-10-CM

## 2023-12-10 ENCOUNTER — Encounter: Payer: Self-pay | Admitting: Family Medicine

## 2023-12-10 ENCOUNTER — Ambulatory Visit (INDEPENDENT_AMBULATORY_CARE_PROVIDER_SITE_OTHER): Admitting: Family Medicine

## 2023-12-10 VITALS — BP 143/87 | HR 106 | Ht <= 58 in | Wt 229.2 lb

## 2023-12-10 DIAGNOSIS — E119 Type 2 diabetes mellitus without complications: Secondary | ICD-10-CM | POA: Diagnosis not present

## 2023-12-10 DIAGNOSIS — G479 Sleep disorder, unspecified: Secondary | ICD-10-CM | POA: Insufficient documentation

## 2023-12-10 LAB — POCT GLYCOSYLATED HEMOGLOBIN (HGB A1C): HbA1c, POC (controlled diabetic range): 9.9 % — AB (ref 0.0–7.0)

## 2023-12-10 MED ORDER — SEMAGLUTIDE (2 MG/DOSE) 8 MG/3ML ~~LOC~~ SOPN: 2.0000 mg | PEN_INJECTOR | SUBCUTANEOUS | 3 refills | Status: DC

## 2023-12-10 NOTE — Assessment & Plan Note (Signed)
 Poorly controlled at this time with A1c 9.9 today.  - Discussed and increased Ozempic  to 2 mg weekly - Cont metformin  1000 BID  - Consider starting SGLT-2 inhibitor at next visit which may help with BP as well  - Patient reports last diabetic eye exam completed in Oct 2024 without retinopathy findings  - Urine microalb/Cr collected today

## 2023-12-10 NOTE — Patient Instructions (Addendum)
 Thank you for visiting clinic today and allowing us  to participate in your care!  Your A1c was high today. We increased your Ozempic  dosage today. Please continue taking your metformin  as prescribed. Please see your eye doctor soon for your yearly diabetic eye exam.   Please go to your local pharmacy for the tetanus shot.   For your difficulty staying asleep, please try to practice healthy sleep hygiene:  - Try the following to help you sleep better: - limit naps during the day - no screens (TV, phone, tablet, computer) at least 1-2 hours before bedtime. - have a quiet and dark sleeping environment. - no large meals or drinks about 1 hour before bed. - Avoid taking diuretics (hydrochlorothiazide , furosemide) in the evenings. - Avoid caffeine after 3pm. - Exercise or move your body regularly every day.  - You can also try melatonin 10 mg over the counter. Take this 1-2 hours before bed. You can increase to 20mg  if this is not helpful. - If you are lying in bed for 30 mins-1 hour and aren't falling asleep, get out of bed and do something relaxing like reading (NO TV!) until you are tired.  Please schedule an appointment in 2 weeks to follow up.   Reach out any time with any questions or concerns you may have - we are here for you!  Carey Chapman, MD [facility] [phone]

## 2023-12-10 NOTE — Assessment & Plan Note (Signed)
 Reported difficulty staying asleep at night. No issues falling asleep.  - Discussed and provided sleep hygiene recommendations in AVS

## 2023-12-10 NOTE — Progress Notes (Signed)
    SUBJECTIVE:   CHIEF COMPLAINT / HPI:   Sleep troubles:  - Notes she has no issues with falling asleep, but has issues staying asleep - Wakes up after a handful of hours and then can't fall back to sleep  - No recent falls out of bed like before  - Now has bedrails to prevent falls  DM - Takes metformin  1000 twice daily, Ozempic  1 mg weekly  - Has been eating salads - Has been more active lately  - Would like to improve blood sugar control   PERTINENT  PMH / PSH: T2DM, HTN, Asthma, CP, Depression  OBJECTIVE:   BP (!) 143/87   Pulse (!) 106   Ht 4\' 9"  (1.448 m)   Wt 229 lb 3.2 oz (104 kg)   LMP 12/09/2023   SpO2 100%   BMI 49.60 kg/m   General: Well-appearing. Resting comfortably in room. CV: Normal S1/S2. No extra heart sounds. Warm and well-perfused. Pulm: Breathing comfortably on room air. CTAB, no crackles or wheezing. No increased WOB. Abd: Soft, non-tender, non-distended. Skin:  Warm, dry. No extremity swelling.  Psych: Pleasant and appropriate.   ASSESSMENT/PLAN:   Assessment & Plan Type 2 diabetes mellitus without complication, without long-term current use of insulin (HCC) Poorly controlled at this time with A1c 9.9 today.  - Discussed and increased Ozempic  to 2 mg weekly - Cont metformin  1000 BID  - Consider starting SGLT-2 inhibitor at next visit which may help with BP as well  - Patient reports last diabetic eye exam completed in Oct 2024 without retinopathy findings  - Urine microalb/Cr collected today  Sleep disturbance Reported difficulty staying asleep at night. No issues falling asleep.  - Discussed and provided sleep hygiene recommendations in AVS    Discussed going to local pharmacy to receive tetanus shot.   Return to clinic in 2 weeks for continued follow up.   Carey Chapman, MD [facility]

## 2023-12-11 ENCOUNTER — Ambulatory Visit: Payer: Self-pay | Admitting: Family Medicine

## 2023-12-11 LAB — MICROALBUMIN / CREATININE URINE RATIO
Creatinine, Urine: 313.7 mg/dL
Microalb/Creat Ratio: 12 mg/g{creat} (ref 0–29)
Microalbumin, Urine: 38.3 ug/mL

## 2024-01-13 ENCOUNTER — Other Ambulatory Visit: Payer: Self-pay | Admitting: Family Medicine

## 2024-01-13 NOTE — Telephone Encounter (Signed)
 Chart reviewed. Rx refilled.

## 2024-01-15 ENCOUNTER — Ambulatory Visit: Admitting: Family Medicine

## 2024-01-21 ENCOUNTER — Encounter: Payer: Self-pay | Admitting: Family Medicine

## 2024-01-21 ENCOUNTER — Ambulatory Visit (INDEPENDENT_AMBULATORY_CARE_PROVIDER_SITE_OTHER): Admitting: Family Medicine

## 2024-01-21 VITALS — BP 121/83 | HR 98 | Ht <= 58 in | Wt 228.2 lb

## 2024-01-21 DIAGNOSIS — M797 Fibromyalgia: Secondary | ICD-10-CM | POA: Diagnosis not present

## 2024-01-21 DIAGNOSIS — E119 Type 2 diabetes mellitus without complications: Secondary | ICD-10-CM | POA: Diagnosis not present

## 2024-01-21 MED ORDER — AMITRIPTYLINE HCL 25 MG PO TABS: 50.0000 mg | ORAL_TABLET | Freq: Every day | ORAL | 2 refills | Status: DC

## 2024-01-21 NOTE — Patient Instructions (Addendum)
 Thank you for visiting clinic today and allowing us  to participate in your care!  Please continue taking your diabetes medications as you have been.  Please remember to be resting comfortably and sitting down when administering the Ozempic .  Please plan to follow-up with Dr. Ruthann for the [facility] as planned.  You may take 50 mg of amitriptyline  per day.  If you notice that you become more dizzy or are having falls, please reduce back to 25 mg per day.  Please schedule an appointment in 1-2 months for continued follow-up.  Reach out any time with any questions or concerns you may have - we are here for you!  Damien Cassis, MD [facility] [phone]

## 2024-01-21 NOTE — Progress Notes (Signed)
    SUBJECTIVE:   CHIEF COMPLAINT / HPI:   T2DM - taking metformin  daily, recently increased to ozempic  2mg  weekly  - no new abdominal pain, some intermittent nausea, chronic IBS diarrhea  - has dizziness for 10-15 mins after administering ozempic  - self resolves after sitting  - would like to continue Ozempic  at current dose  - interested in discussing liberate study with Dr Amalia   Fibromyalgia Patient would like to return to 50mg  amitrityline daily as this worked better for her pain. Was previously reduced to 25mg  for falls out of bed. Reports no recent falls.   PERTINENT  PMH / PSH: T2DM, HTN, CP, Fibromyalgia   OBJECTIVE:   BP 121/83   Pulse 98   Ht 4' 9 (1.448 m)   Wt 228 lb 4 oz (103.5 kg)   LMP 01/08/2024   SpO2 99%   BMI 49.39 kg/m   General: Well-appearing. Resting comfortably in room. CV: Normal S1/S2. No extra heart sounds. Warm and well-perfused. Pulm: Breathing comfortably on room air. CTAB anteriorly. No increased WOB. Abd: Soft, non-tender.  Skin:  Warm, dry. Psych: Pleasant and appropriate.    ASSESSMENT/PLAN:   Assessment & Plan Type 2 diabetes mellitus without complication, without long-term current use of insulin (HCC) Last A1c 9.9 on 12/10/23. Suspect vasovagal component of dizziness after Ozempic  administration.  - Continue Ozempic  and Metformin  at current dosage  - Discussed remaining sitting for safety during Ozempic  administration  - Patient seen by Dr Koval to discuss Liberate study  Fibromyalgia - Increased to Amitriptyline  50 daily per patient request - discussed reducing back to 25mg  daily if feeling worsening dizziness - Cont Cymbalta , gabapentin  as currently prescribed     RTC in 1-2 months for continued follow up.   Damien Cassis, MD [facility]

## 2024-01-21 NOTE — Progress Notes (Signed)
 Potential Candidate for Liberate CGM study - discussed with Dr. Diona.   Patient expressed interest.   Discussed outline of LIBERATE study and provide information enrollment information.  Attempted to download Libre3 APP but phone needed credit card of file.  Asked patient to complete this step and downlowad APP priot to scheduling a visit back with me for study initiation.    Patient scheduled Liberate visit on 7/10

## 2024-01-23 NOTE — Assessment & Plan Note (Signed)
 Last A1c 9.9 on 12/10/23. Suspect vasovagal component of dizziness after Ozempic  administration.  - Continue Ozempic  and Metformin  at current dosage  - Discussed remaining sitting for safety during Ozempic  administration  - Patient seen by Dr Koval to discuss Liberate study

## 2024-01-23 NOTE — Assessment & Plan Note (Signed)
-   Increased to Amitriptyline  50 daily per patient request - discussed reducing back to 25mg  daily if feeling worsening dizziness - Cont Cymbalta , gabapentin  as currently prescribed

## 2024-02-04 ENCOUNTER — Ambulatory Visit: Admitting: Pharmacist

## 2024-02-06 ENCOUNTER — Other Ambulatory Visit: Payer: Self-pay | Admitting: Family Medicine

## 2024-02-06 DIAGNOSIS — L989 Disorder of the skin and subcutaneous tissue, unspecified: Secondary | ICD-10-CM

## 2024-02-06 DIAGNOSIS — E119 Type 2 diabetes mellitus without complications: Secondary | ICD-10-CM

## 2024-02-17 ENCOUNTER — Telehealth: Payer: Self-pay | Admitting: Pharmacist

## 2024-02-17 NOTE — Telephone Encounter (Signed)
 Attempted to contact patient ed for follow-up of missed appt for Liberate study enrollment on 02/03/24  No answer. Left HIPAA compliant VM requesting call back to reschedule if interested in additional support.   Total time with patient call and documentation of interaction: 7 minutes.

## 2024-02-19 ENCOUNTER — Other Ambulatory Visit: Payer: Self-pay | Admitting: Family Medicine

## 2024-02-22 ENCOUNTER — Telehealth: Payer: Self-pay | Admitting: Pharmacist

## 2024-02-22 NOTE — Telephone Encounter (Signed)
 Reviewed and agree with Dr Macky Lower plan.

## 2024-02-22 NOTE — Telephone Encounter (Signed)
 Patient contacts office to follow-up on missed appointment for potential Liberate study enrollment on 7/10  Returned call - patient reports she is doing well. She denies any glucose monitoring of any type.   She reports she has downloaded the [place] APP on her phone.  Scheduled appointment 8/7 at 11:00AM  Total time with patient call and documentation of interaction: 12 minutes.

## 2024-02-23 ENCOUNTER — Telehealth: Payer: Self-pay | Admitting: Family Medicine

## 2024-02-23 NOTE — Telephone Encounter (Signed)
 Patient was identified as falling into the [place] Measure - Diabetes.   Patient was: Appointment scheduled with primary care provider in the next 30 days.

## 2024-03-03 ENCOUNTER — Ambulatory Visit

## 2024-03-03 ENCOUNTER — Encounter: Payer: Self-pay | Admitting: Pharmacist

## 2024-03-03 ENCOUNTER — Ambulatory Visit: Admitting: Pharmacist

## 2024-03-03 VITALS — BP 120/86 | HR 109 | Ht <= 58 in | Wt 225.5 lb

## 2024-03-03 DIAGNOSIS — Z1239 Encounter for other screening for malignant neoplasm of breast: Secondary | ICD-10-CM | POA: Diagnosis not present

## 2024-03-03 DIAGNOSIS — E119 Type 2 diabetes mellitus without complications: Secondary | ICD-10-CM

## 2024-03-03 LAB — POCT GLYCOSYLATED HEMOGLOBIN (HGB A1C): HbA1c, POC (controlled diabetic range): 7.7 % — AB (ref 0.0–7.0)

## 2024-03-03 NOTE — Assessment & Plan Note (Signed)
 Diabetes longstanding per chart 4/23 currently improving A1c. Patient is able to verbalize appropriate hypoglycemia management plan. Medication adherence appears fair. Control is suboptimal due to poor diet. Evaluated as not eligible for study due to A1c < 8. -Continued Ozempic  (semaglutide ) 8 MG/3ML injecting 2 mg/dose  -Clarified metformin  500 mg XR taking 2 tablets daily as directed, as patient reported taking only 1 tablet.  -Discussed plan for better diet and got a goal weight of 217 lb for the next time she follows up. Aiming to lose 1-2 lbs per week with dietary changes and medication adherence. -Patient educated on purpose, proper use, and potential adverse effects. -Extensively discussed pathophysiology of diabetes, recommended lifestyle interventions, dietary effects on blood sugar control.

## 2024-03-03 NOTE — Assessment & Plan Note (Signed)
 A1c is 7.7 today, down from 9.9 on 12/10/2023.  Patient currently on 2 mg Ozempic  and 500 mg metformin .  Patient is prescribed 1000 mg of metformin , however she has been confused about the dose and only been taking one 500 mg tablet.  Last patient with Dr. Koval.  She no longer qualifies for liberate study.  However, Dr. Koval advised that patient should begin taking two 500 mg of metformin  today for a dose at 1000 mg daily.

## 2024-03-03 NOTE — Patient Instructions (Addendum)
 It was nice to see you today! Start looking for cereals with less sugars and less carbs. Maybe start with trying half and half raisin bran with bran flakes by itself. Try using with lactose-free milk instead of the lactaid to cut calories such as Fair Life skim milk, if it doesn't taste as good as you're used to try the 1% of the [employer]. Consider cutting back on high calorie drinks like using propel mix, zero calorie sodas, and zero calorie Powerade. If eating bananas that go brown consider eating half rather than full for less sugar. Eat high sugar fruits in moderation like 5 cherries at a time.   Your goal blood sugar is 80-130 before eating and less than 180 after eating.  Medication Changes: Increase to take metformin  XR 500 mg 2 tabs by mouth daily  Continue all other medication the same.   Consider a weight loss goal of 1-2 lbs a week by next visit.   Monitor blood sugars at home and keep a log (glucometer or piece of paper) to bring with you to your next visit.  Keep up the good work with diet and exercise. Aim for a diet full of vegetables, fruit and lean meats (chicken, [place], fish). Try to limit salt intake by eating fresh or frozen vegetables (instead of canned), rinse canned vegetables prior to cooking and do not add any additional salt to meals.

## 2024-03-03 NOTE — Progress Notes (Cosign Needed Addendum)
    SUBJECTIVE:   CHIEF COMPLAINT / HPI:   Diabetes follow-up  Patient is on metformin  and Ozempic  for her diabetes.  States she has been doing well with his medications, has had no complications.  Patient is prescribed 1000 mg of metformin  daily.  She is supposed to be taking two 500 mg pills.  However, states she has only been taking one 500 mg pill daily.  PERTINENT  PMH / PSH: T2DM, dementia, asthma, cerebral palsy  OBJECTIVE:   BP 120/86   Pulse (!) 109   Ht 4' 9 (1.448 m)   Wt 225 lb 8 oz (102.3 kg)   LMP 02/06/2024   SpO2 97%   BMI 48.80 kg/m   General: Well-appearing female, no acute distress Cardiovascular: Tachycardic, regular rhythm, no M/R/G Respiratory: CTAB, normal work of breathing on room air Abdomen: Soft, nondistended, nontender to palpation, bowel sounds present Extremities: Warm, dry, posterior tibial pulses 2+, no edema  ASSESSMENT/PLAN:   Assessment & Plan Type 2 diabetes mellitus without complication, without long-term current use of insulin (HCC) A1c is 7.7 today, down from 9.9 on 12/10/2023.  Patient currently on 2 mg Ozempic  and 500 mg metformin .  Patient is prescribed 1000 mg of metformin , however she has been confused about the dose and only been taking one 500 mg tablet.  Last patient with Dr. Koval.  She no longer qualifies for liberate study.  However, Dr. Koval advised that patient should begin taking two 500 mg of metformin  today for a dose at 1000 mg daily. Encounter for breast cancer screening using non-mammogram modality Patient due for mammogram.  Is agreeable at this time.  Phone number to call for scheduling was given.    Raguel KANDICE Lee, DO [facility]

## 2024-03-03 NOTE — Patient Instructions (Addendum)
 Your A1c was 7.7 today, which is improved from last time.   Today we are getting a blood test called a BMP to check your kidney function.   Please make an appointment for a yearly check up.     Also, I recommend you undergo a mammogram.   You can call to schedule an appointment by calling [phone].   Directions [address] [place], [place] [address]  Please let me know if you have questions. I will send you a letter or call you with results.

## 2024-03-03 NOTE — Progress Notes (Signed)
 S:     Chief Complaint  Patient presents with   Medication Management    DM follow-up / liberate study evaluation   48 y.o. female who presents for diabetes evaluation, education, and management for potential inclusion for the liberate study. Patient arrives in good spirits and presents with assistance of cane. Patient is accompanied by daughter.   Patient was referred and last seen by Primary Care Provider, Dr. Diona, on 01/21/24.  At last visit, Dr. Diona referred to evaluate for liberate study due to matching eligibility criteria with an A1c of 9.9.   PMH is significant for hypertension and type 2 diabetes mellitus.  Patient reports Diabetes was diagnosed in 10/2021.   Current diabetes medications include:  Metformin  XR 500 mg 2 tablets daily as prescribed. Patient reports taking metformin  XR 500 mg as only 1 tablet daily instead of 2 as directed due to forgetting change.   Ozempic  (semaglutide ) 57m/3ml  2mg /dose weekly  Current hypertension medications include:  Amlodipine -Valsartan  10-160 mg 1 tablet daily  Current hyperlipidemia medications include:  Rosuvastatin  5 mg daily  Patient reports adherence to taking all other medications as prescribed.   Patient denies hypoglycemic events.  Patient denies nocturia (nighttime urination).  Patient denies neuropathy (nerve pain). Patient denies visual changes. Patient denies self foot exams.   Patient reported dietary habits: Eats ~2 meals/day  Breakfast: 2 bowls of raisin bran with 1 or 2 % milk Lunch: naps with no food Dinner: fried or baked chicken with veggies w/ broccoli or green peas and was. Sometimes use ready make meals and other processed foods like chicken potpie Snacks: fruits at time (cherries and strawberries)  Drinks: sprite 2x a week and coke 1 x a week but more water otherwise. Apple juice and juicy juice. Not a big fan sweetners. Regular Gatorade. Still likes water  Reports not eating a lot of rice because of  trouble preparing.  Patient-reported exercise habits: not much since bad knees   O:   Review of Systems  Musculoskeletal:  Positive for joint pain.  All other systems reviewed and are negative.   Physical Exam Constitutional:      Appearance: Normal appearance.  Pulmonary:     Effort: Pulmonary effort is normal.  Neurological:     Mental Status: She is alert.  Psychiatric:        Mood and Affect: Mood normal.        Behavior: Behavior normal.        Thought Content: Thought content normal.     Lab Results  Component Value Date   HGBA1C 7.7 (A) 03/03/2024   There were no vitals filed for this visit.  Lipid Panel     Component Value Date/Time   CHOL 134 04/28/2023 1658   TRIG 133 04/28/2023 1658   HDL 62 04/28/2023 1658   CHOLHDL 2.2 04/28/2023 1658   LDLCALC 49 04/28/2023 1658    Clinical Atherosclerotic Cardiovascular Disease (ASCVD): No  The 10-year ASCVD risk score (Arnett DK, et al., 2019) is: 2.5%   Values used to calculate the score:     Age: 56 years     Clincally relevant sex: Female     Is Non-Hispanic African American: Yes     Diabetic: Yes     Tobacco smoker: No     Systolic Blood Pressure: 120 mmHg     Is BP treated: Yes     HDL Cholesterol: 62 mg/dL     Total Cholesterol: 134 mg/dL  A/P: Diabetes longstanding per chart 4/23 currently improving A1c. Patient is able to verbalize appropriate hypoglycemia management plan. Medication adherence appears fair. Control is suboptimal due to poor diet. Evaluated as not eligible for study due to A1c < 8. -Continued Ozempic  (semaglutide ) 8 MG/3ML injecting 2 mg/dose  -Clarified metformin  500 mg XR taking 2 tablets daily as directed, as patient reported taking only 1 tablet.  -Discussed plan for better diet and got a goal weight of 217 lb for the next time she follows up. Aiming to lose 1-2 lbs per week with dietary changes and medication adherence. -Patient educated on purpose, proper use, and potential  adverse effects. -Extensively discussed pathophysiology of diabetes, recommended lifestyle interventions, dietary effects on blood sugar control.    Written patient instructions provided. Patient verbalized understanding of treatment plan.  Total time in face to face counseling 20 minutes.    Follow-up:  PCP clinic visit with Dr. Diona on 04/20/24 Patient seen with Fonda Blase, PharmD Candidate - [employer]3 student and Calton Nash, PharmD Candidate - [employer]4 student.

## 2024-03-04 ENCOUNTER — Ambulatory Visit: Payer: Self-pay

## 2024-03-04 LAB — BASIC METABOLIC PANEL WITH GFR
BUN/Creatinine Ratio: 10 (ref 9–23)
BUN: 7 mg/dL (ref 6–24)
CO2: 21 mmol/L (ref 20–29)
Calcium: 10.2 mg/dL (ref 8.7–10.2)
Chloride: 99 mmol/L (ref 96–106)
Creatinine, Ser: 0.72 mg/dL (ref 0.57–1.00)
Glucose: 211 mg/dL — ABNORMAL HIGH (ref 70–99)
Potassium: 4.3 mmol/L (ref 3.5–5.2)
Sodium: 139 mmol/L (ref 134–144)
eGFR: 104 mL/min/1.73 (ref >59)

## 2024-03-04 NOTE — Progress Notes (Signed)
 Reviewed and agree with Dr Macky Lower plan.

## 2024-03-23 ENCOUNTER — Other Ambulatory Visit: Payer: Self-pay | Admitting: Family Medicine

## 2024-04-20 ENCOUNTER — Encounter: Payer: Self-pay | Admitting: Family Medicine

## 2024-04-20 ENCOUNTER — Ambulatory Visit (INDEPENDENT_AMBULATORY_CARE_PROVIDER_SITE_OTHER): Admitting: Family Medicine

## 2024-04-20 VITALS — BP 103/80 | HR 118 | Ht <= 58 in | Wt 223.4 lb

## 2024-04-20 DIAGNOSIS — E785 Hyperlipidemia, unspecified: Secondary | ICD-10-CM

## 2024-04-20 DIAGNOSIS — Z7689 Persons encountering health services in other specified circumstances: Secondary | ICD-10-CM

## 2024-04-20 DIAGNOSIS — E119 Type 2 diabetes mellitus without complications: Secondary | ICD-10-CM | POA: Diagnosis not present

## 2024-04-20 DIAGNOSIS — Z23 Encounter for immunization: Secondary | ICD-10-CM

## 2024-04-20 MED ORDER — DICLOFENAC SODIUM 1 % EX GEL: 2.0000 g | Freq: Four times a day (QID) | CUTANEOUS | 1 refills | Status: AC | PRN

## 2024-04-20 MED ORDER — AMITRIPTYLINE HCL 25 MG PO TABS: 50.0000 mg | ORAL_TABLET | Freq: Every day | ORAL | 2 refills | Status: AC

## 2024-04-20 MED ORDER — SEMAGLUTIDE (2 MG/DOSE) 8 MG/3ML ~~LOC~~ SOPN: 2.0000 mg | PEN_INJECTOR | SUBCUTANEOUS | 3 refills | Status: AC

## 2024-04-20 MED ORDER — ROSUVASTATIN CALCIUM 5 MG PO TABS: 5.0000 mg | ORAL_TABLET | Freq: Every day | ORAL | 3 refills | Status: AC

## 2024-04-20 NOTE — Patient Instructions (Signed)
 Thank you for visiting clinic today and allowing us  to participate in your care!  Keep up the wonderful work with diet changes and exercise!   You can use the voltaren  gel as needed for joint pain.   Please schedule an appointment at your earliest convenience for your pap smear and other routine health maintenance.   Reach out any time with any questions or concerns you may have - we are here for you!  Damien Cassis, MD [facility] [phone]

## 2024-04-20 NOTE — Progress Notes (Signed)
    SUBJECTIVE:   CHIEF COMPLAINT / HPI:   Weight -Presenting today for follow up on weight loss -On Ozempic  2mg  weekly and Jardiance -Has been focusing on water, cutting out sugary drinks, diet pepsi once a week  -Eating more salads, switched from pork to [place]  -Eating more fruit  -Exercising more, goes on walks, knees sore from arthritis   PERTINENT  PMH / PSH: T2DM, CP  OBJECTIVE:   BP 103/80   Pulse (!) 118   Ht 4' 9 (1.448 m)   Wt 223 lb 6.4 oz (101.3 kg)   LMP 04/05/2024   SpO2 99%   BMI 48.34 kg/m   General: Well-appearing. Resting comfortably in room. CV: Normal S1/S2. No extra heart sounds. Warm and well-perfused. Pulm: Breathing comfortably on room air. CTAB. No increased WOB. Skin/Ext:  Warm, dry. No significant lower extremity edema.  Psych: Pleasant and appropriate.    ASSESSMENT/PLAN:   Assessment & Plan Encounter for weight management Type 2 diabetes mellitus without complication, without long-term current use of insulin (HCC) A1c improvement to 7.7 last month.  Weight downtrending. BP stable.  -Continue current regimen  -Congratulated and encouraged patient to continue healthy lifestyle habits  -Discussed medical weight management clinic - patient defers at this time -Discussed voltaren  gel prn for knee arthritis pain with exercising  Encounter for immunization Received annual flu vaccine today.    RTC at earliest convenience for pap smear, routine healthcare maintenance.   Damien Cassis, MD [facility]

## 2024-04-20 NOTE — Assessment & Plan Note (Signed)
 A1c improvement to 7.7 last month.  Weight downtrending. BP stable.  -Continue current regimen  -Congratulated and encouraged patient to continue healthy lifestyle habits  -Discussed medical weight management clinic - patient defers at this time -Discussed voltaren  gel prn for knee arthritis pain with exercising

## 2024-05-12 NOTE — Progress Notes (Deleted)
    SUBJECTIVE:   CHIEF COMPLAINT / HPI:   Presenting for routine pap smear  Routine STI/STD workup *** Sx:   PERTINENT  PMH / PSH: ***  OBJECTIVE:   LMP 04/05/2024   ***  ASSESSMENT/PLAN:   Assessment & Plan    Last pap smear normal in 2019.   Damien Cassis, MD [facility]

## 2024-05-13 ENCOUNTER — Ambulatory Visit: Admitting: Family Medicine

## 2024-05-17 ENCOUNTER — Other Ambulatory Visit: Payer: Self-pay | Admitting: Family Medicine

## 2024-05-17 NOTE — Telephone Encounter (Signed)
 Chart reviewed. Rx refilled.

## 2024-05-18 ENCOUNTER — Other Ambulatory Visit: Payer: Self-pay | Admitting: Family Medicine

## 2024-05-18 DIAGNOSIS — M797 Fibromyalgia: Secondary | ICD-10-CM

## 2024-05-19 NOTE — Telephone Encounter (Signed)
 Chart reviewed. Rx refilled.

## 2024-06-06 ENCOUNTER — Ambulatory Visit: Admitting: Family Medicine

## 2024-06-16 ENCOUNTER — Other Ambulatory Visit: Payer: Self-pay | Admitting: Family Medicine

## 2024-06-16 NOTE — Telephone Encounter (Signed)
 Chart reviewed. Rx refilled.

## 2024-07-17 ENCOUNTER — Other Ambulatory Visit: Payer: Self-pay | Admitting: Family Medicine

## 2024-07-18 NOTE — Telephone Encounter (Signed)
 Chart reviewed. PRN rx sent.

## 2024-08-11 ENCOUNTER — Encounter: Payer: Self-pay | Admitting: Pharmacist

## 2024-08-11 NOTE — Progress Notes (Signed)
 This patient is appearing on a report for being at risk of failing the adherence measure for cholesterol (statin) medications this calendar year.   Medication: rosuvastatin  5 mg  Last fill date: 07/09/24 for 90 day supply  Reviewed medication indication, dosing, and goals of therapy.

## 2024-08-13 ENCOUNTER — Other Ambulatory Visit: Payer: Self-pay | Admitting: Family Medicine

## 2024-08-15 NOTE — Telephone Encounter (Signed)
 Chart reviewed. Rx refilled.
